# Patient Record
Sex: Female | Born: 1970 | Race: White | Hispanic: No | Marital: Married | Smoking: Never smoker
Health system: Southern US, Community
[De-identification: ages and names within clinical notes are randomized; demographics above are authoritative.]

## PROBLEM LIST (undated history)

## (undated) ENCOUNTER — Emergency Department (HOSPITAL_COMMUNITY): Disposition: A | Discharge status: Home / Self Care | Payer: 59

## (undated) ENCOUNTER — Inpatient Hospital Stay (HOSPITAL_COMMUNITY): Payer: Self-pay

## (undated) DIAGNOSIS — Z9889 Other specified postprocedural states: Secondary | ICD-10-CM

## (undated) DIAGNOSIS — G43909 Migraine, unspecified, not intractable, without status migrainosus: Secondary | ICD-10-CM

## (undated) DIAGNOSIS — O039 Complete or unspecified spontaneous abortion without complication: Secondary | ICD-10-CM

## (undated) DIAGNOSIS — G43709 Chronic migraine without aura, not intractable, without status migrainosus: Secondary | ICD-10-CM

## (undated) DIAGNOSIS — D649 Anemia, unspecified: Secondary | ICD-10-CM

## (undated) DIAGNOSIS — R0789 Other chest pain: Secondary | ICD-10-CM

## (undated) DIAGNOSIS — R112 Nausea with vomiting, unspecified: Secondary | ICD-10-CM

## (undated) DIAGNOSIS — J189 Pneumonia, unspecified organism: Secondary | ICD-10-CM

## (undated) DIAGNOSIS — E559 Vitamin D deficiency, unspecified: Secondary | ICD-10-CM

## (undated) DIAGNOSIS — M1990 Unspecified osteoarthritis, unspecified site: Secondary | ICD-10-CM

## (undated) DIAGNOSIS — IMO0002 Reserved for concepts with insufficient information to code with codable children: Secondary | ICD-10-CM

## (undated) DIAGNOSIS — J45909 Unspecified asthma, uncomplicated: Secondary | ICD-10-CM

## (undated) HISTORY — PX: DILATION AND CURETTAGE OF UTERUS: SHX78

## (undated) HISTORY — DX: Chronic migraine without aura, not intractable, without status migrainosus: G43.709

## (undated) HISTORY — PX: ABDOMINAL HYSTERECTOMY: SHX81

## (undated) HISTORY — DX: Other chest pain: R07.89

## (undated) HISTORY — PX: OTHER SURGICAL HISTORY: SHX169

## (undated) HISTORY — PX: UMBILICAL HERNIA REPAIR: SHX196

## (undated) HISTORY — DX: Other specified postprocedural states: Z98.890

## (undated) HISTORY — PX: BREAST SURGERY: SHX581

## (undated) HISTORY — DX: Reserved for concepts with insufficient information to code with codable children: IMO0002

## (undated) HISTORY — DX: Complete or unspecified spontaneous abortion without complication: O03.9

---

## 1997-10-09 ENCOUNTER — Encounter: Admission: RE | Admit: 1997-10-09 | Discharge: 1998-01-07 | Payer: Self-pay | Admitting: Obstetrics & Gynecology

## 1998-02-11 ENCOUNTER — Encounter (HOSPITAL_COMMUNITY): Admission: RE | Admit: 1998-02-11 | Discharge: 1998-05-12 | Payer: Self-pay | Admitting: *Deleted

## 1998-05-16 ENCOUNTER — Encounter (HOSPITAL_COMMUNITY): Admission: RE | Admit: 1998-05-16 | Discharge: 1998-08-14 | Payer: Self-pay | Admitting: *Deleted

## 1998-12-03 ENCOUNTER — Ambulatory Visit (HOSPITAL_COMMUNITY): Admission: RE | Admit: 1998-12-03 | Discharge: 1998-12-03 | Payer: Self-pay

## 1999-01-27 ENCOUNTER — Other Ambulatory Visit: Admission: RE | Admit: 1999-01-27 | Discharge: 1999-01-27 | Payer: Self-pay | Admitting: Obstetrics and Gynecology

## 1999-09-17 ENCOUNTER — Inpatient Hospital Stay (HOSPITAL_COMMUNITY): Admission: AD | Admit: 1999-09-17 | Discharge: 1999-09-17 | Payer: Self-pay | Admitting: Obstetrics and Gynecology

## 1999-10-27 ENCOUNTER — Emergency Department (HOSPITAL_COMMUNITY): Admission: EM | Admit: 1999-10-27 | Discharge: 1999-10-27 | Payer: Self-pay | Admitting: *Deleted

## 1999-11-05 ENCOUNTER — Inpatient Hospital Stay (HOSPITAL_COMMUNITY): Admission: AD | Admit: 1999-11-05 | Discharge: 1999-11-05 | Payer: Self-pay | Admitting: Obstetrics and Gynecology

## 1999-11-27 ENCOUNTER — Inpatient Hospital Stay (HOSPITAL_COMMUNITY): Admission: AD | Admit: 1999-11-27 | Discharge: 1999-11-27 | Payer: Self-pay | Admitting: Obstetrics and Gynecology

## 1999-12-11 ENCOUNTER — Inpatient Hospital Stay (HOSPITAL_COMMUNITY): Admission: AD | Admit: 1999-12-11 | Discharge: 1999-12-11 | Payer: Self-pay | Admitting: Obstetrics and Gynecology

## 1999-12-20 ENCOUNTER — Inpatient Hospital Stay (HOSPITAL_COMMUNITY): Admission: AD | Admit: 1999-12-20 | Discharge: 1999-12-20 | Payer: Self-pay | Admitting: Obstetrics & Gynecology

## 2000-01-03 ENCOUNTER — Inpatient Hospital Stay (HOSPITAL_COMMUNITY): Admission: AD | Admit: 2000-01-03 | Discharge: 2000-01-03 | Payer: Self-pay | Admitting: Obstetrics and Gynecology

## 2000-01-12 ENCOUNTER — Inpatient Hospital Stay (HOSPITAL_COMMUNITY): Admission: AD | Admit: 2000-01-12 | Discharge: 2000-01-12 | Payer: Self-pay | Admitting: Obstetrics & Gynecology

## 2000-01-19 ENCOUNTER — Observation Stay (HOSPITAL_COMMUNITY): Admission: AD | Admit: 2000-01-19 | Discharge: 2000-01-20 | Payer: Self-pay | Admitting: Obstetrics and Gynecology

## 2000-01-31 ENCOUNTER — Inpatient Hospital Stay (HOSPITAL_COMMUNITY): Admission: AD | Admit: 2000-01-31 | Discharge: 2000-01-31 | Payer: Self-pay | Admitting: Obstetrics and Gynecology

## 2000-02-08 ENCOUNTER — Inpatient Hospital Stay (HOSPITAL_COMMUNITY): Admission: AD | Admit: 2000-02-08 | Discharge: 2000-02-10 | Payer: Self-pay | Admitting: Obstetrics and Gynecology

## 2000-02-11 ENCOUNTER — Encounter: Admission: RE | Admit: 2000-02-11 | Discharge: 2000-04-05 | Payer: Self-pay | Admitting: Obstetrics and Gynecology

## 2000-03-11 ENCOUNTER — Other Ambulatory Visit: Admission: RE | Admit: 2000-03-11 | Discharge: 2000-03-11 | Payer: Self-pay | Admitting: Obstetrics and Gynecology

## 2000-05-02 ENCOUNTER — Emergency Department (HOSPITAL_COMMUNITY): Admission: EM | Admit: 2000-05-02 | Discharge: 2000-05-02 | Payer: Self-pay | Admitting: Emergency Medicine

## 2001-01-20 ENCOUNTER — Emergency Department (HOSPITAL_COMMUNITY): Admission: EM | Admit: 2001-01-20 | Discharge: 2001-01-20 | Payer: Self-pay | Admitting: Emergency Medicine

## 2001-01-20 ENCOUNTER — Encounter: Payer: Self-pay | Admitting: Emergency Medicine

## 2001-08-25 ENCOUNTER — Other Ambulatory Visit: Admission: RE | Admit: 2001-08-25 | Discharge: 2001-08-25 | Payer: Self-pay | Admitting: Obstetrics and Gynecology

## 2002-02-20 ENCOUNTER — Emergency Department (HOSPITAL_COMMUNITY): Admission: EM | Admit: 2002-02-20 | Discharge: 2002-02-20 | Payer: Self-pay | Admitting: Emergency Medicine

## 2003-11-20 ENCOUNTER — Other Ambulatory Visit: Admission: RE | Admit: 2003-11-20 | Discharge: 2003-11-20 | Payer: Self-pay | Admitting: Obstetrics and Gynecology

## 2006-10-07 HISTORY — PX: OTHER SURGICAL HISTORY: SHX169

## 2006-10-12 ENCOUNTER — Encounter: Admission: RE | Admit: 2006-10-12 | Discharge: 2006-10-12 | Payer: Self-pay | Admitting: Surgery

## 2006-10-12 ENCOUNTER — Ambulatory Visit (HOSPITAL_BASED_OUTPATIENT_CLINIC_OR_DEPARTMENT_OTHER): Admission: RE | Admit: 2006-10-12 | Discharge: 2006-10-12 | Payer: Self-pay | Admitting: Surgery

## 2006-10-12 ENCOUNTER — Encounter (INDEPENDENT_AMBULATORY_CARE_PROVIDER_SITE_OTHER): Payer: Self-pay | Admitting: Specialist

## 2010-06-03 ENCOUNTER — Ambulatory Visit: Payer: Self-pay | Admitting: Cardiology

## 2010-06-03 ENCOUNTER — Ambulatory Visit: Payer: Self-pay | Admitting: Vascular Surgery

## 2010-06-03 ENCOUNTER — Inpatient Hospital Stay (HOSPITAL_COMMUNITY): Admission: EM | Admit: 2010-06-03 | Discharge: 2010-06-04 | Payer: Self-pay | Admitting: Emergency Medicine

## 2010-06-03 ENCOUNTER — Encounter (INDEPENDENT_AMBULATORY_CARE_PROVIDER_SITE_OTHER): Payer: Self-pay | Admitting: Internal Medicine

## 2010-11-19 LAB — CBC
HCT: 38.7 % (ref 36.0–46.0)
MCH: 30.6 pg (ref 26.0–34.0)
MCHC: 34.1 g/dL (ref 30.0–36.0)
MCV: 89.8 fL (ref 78.0–100.0)
Platelets: 344 10*3/uL (ref 150–400)
RDW: 12.2 % (ref 11.5–15.5)
WBC: 11.8 10*3/uL — ABNORMAL HIGH (ref 4.0–10.5)

## 2010-11-19 LAB — LIPID PANEL
LDL Cholesterol: 49 mg/dL (ref 0–99)
Total CHOL/HDL Ratio: 2.9 RATIO
Triglycerides: 194 mg/dL — ABNORMAL HIGH (ref <150)
VLDL: 39 mg/dL (ref 0–40)

## 2010-11-19 LAB — URINALYSIS, ROUTINE W REFLEX MICROSCOPIC
Bilirubin Urine: NEGATIVE
Ketones, ur: NEGATIVE mg/dL
Nitrite: NEGATIVE
Protein, ur: NEGATIVE mg/dL
Urobilinogen, UA: 0.2 mg/dL (ref 0.0–1.0)

## 2010-11-19 LAB — DIFFERENTIAL
Basophils Absolute: 0 10*3/uL (ref 0.0–0.1)
Basophils Relative: 0 % (ref 0–1)
Lymphocytes Relative: 22 % (ref 12–46)
Monocytes Relative: 8 % (ref 3–12)
Neutro Abs: 8 10*3/uL — ABNORMAL HIGH (ref 1.7–7.7)
Neutrophils Relative %: 68 % (ref 43–77)

## 2010-11-19 LAB — COMPREHENSIVE METABOLIC PANEL
Alkaline Phosphatase: 48 U/L (ref 39–117)
BUN: 12 mg/dL (ref 6–23)
Creatinine, Ser: 0.92 mg/dL (ref 0.4–1.2)
Glucose, Bld: 101 mg/dL — ABNORMAL HIGH (ref 70–99)
Potassium: 3.8 mEq/L (ref 3.5–5.1)
Total Protein: 6.6 g/dL (ref 6.0–8.3)

## 2010-11-19 LAB — POCT I-STAT, CHEM 8
Calcium, Ion: 1.13 mmol/L (ref 1.12–1.32)
Chloride: 105 mEq/L (ref 96–112)
HCT: 40 % (ref 36.0–46.0)
Sodium: 139 mEq/L (ref 135–145)
TCO2: 27 mmol/L (ref 0–100)

## 2010-11-19 LAB — CK TOTAL AND CKMB (NOT AT ARMC): Relative Index: INVALID (ref 0.0–2.5)

## 2010-11-19 LAB — PROTIME-INR: INR: 0.93 (ref 0.00–1.49)

## 2010-11-19 LAB — APTT: aPTT: 27 seconds (ref 24–37)

## 2011-01-22 NOTE — Op Note (Signed)
[name], Sheryl [name]                   ACCOUNT NO.:  [id number]   MEDICAL RECORD NO.:  [id number]          PATIENT TYPE:  AMB   LOCATION:  DSC                          FACILITY:  [facility]   PHYSICIAN:  Currie Paris, M.D.DATE OF BIRTH:  06-02-1971   DATE OF PROCEDURE:  10/12/2006  DATE OF DISCHARGE:                               OPERATIVE REPORT   PREOPERATIVE DIAGNOSIS:  Left breast mass.   POSTOPERATIVE DIAGNOSIS:  Left breast mass.   OPERATION:  Needle guided excision left breast mass.   SURGEON:  Currie Paris, M.D.   ASSISTANT:  Unk Lightning, PA student   ANESTHESIA:  General.   CLINICAL HISTORY:  This is a 35-year lady who had a mammographically  found left breast mass that she wished to have removed.   DESCRIPTION OF PROCEDURE:  The patient was seen in the holding area and  she had no further questions.  The left breast was marked as the  operative side.  A guidewire had already been placed and I reviewed the  mammogram films.  The patient was then taken to the operating room and  after satisfactory general anesthesia had obtained, the left breast was  prepped and draped as a sterile field.  The time-out occurred.   I made a skin mark along the apparent tract of the guidewire and then  made a skin incision.  I divided some of the breast tissue and then  grasped the breast tissue around the guidewire with an Allis clamp and,  using cautery, took a cylinder of tissue around the guidewire.  Towards  the end, I found a of 1.5 cm mass consistent with what was seen on the  mammogram and made sure I excised that.  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  Once this was completely out, the tissue was sent  for specimen mammogram.   The area was check for hemostasis and once everything was dry, I  injected 0.25% plain Marcaine to help with postop analgesia.  The  incision was closed in layers with 3-0 Vicryl, 4-0 Monocryl  subcuticular, and Dermabond on the  skin.   Dr. Jean Rosenthal reported that the mammogram showed the lesion to be  contained in the tissue removed as a specimen.   The patient tolerated the procedure well.  There were no operative  complications.  All counts were correct.      Currie Paris, M.D.  Electronically Signed     CJS/MEDQ  D:  10/12/2006  T:  10/12/2006  Job:  [id number]

## 2011-10-21 ENCOUNTER — Other Ambulatory Visit: Payer: Self-pay | Admitting: Family Medicine

## 2011-10-21 ENCOUNTER — Ambulatory Visit
Admission: RE | Admit: 2011-10-21 | Discharge: 2011-10-21 | Disposition: A | Discharge status: Home / Self Care | Payer: 59 | Source: Ambulatory Visit | Attending: Family Medicine | Admitting: Family Medicine

## 2011-10-21 DIAGNOSIS — I2699 Other pulmonary embolism without acute cor pulmonale: Secondary | ICD-10-CM

## 2011-10-21 MED ORDER — IOHEXOL 350 MG/ML SOLN
125.0000 mL | Freq: Once | INTRAVENOUS | Status: AC | PRN
  Administered 2011-10-21: 125 mL via INTRAVENOUS

## 2012-01-07 ENCOUNTER — Other Ambulatory Visit (INDEPENDENT_AMBULATORY_CARE_PROVIDER_SITE_OTHER): Payer: 59

## 2012-01-07 ENCOUNTER — Ambulatory Visit (INDEPENDENT_AMBULATORY_CARE_PROVIDER_SITE_OTHER): Payer: 59 | Admitting: Pulmonary Disease

## 2012-01-07 ENCOUNTER — Encounter: Payer: Self-pay | Admitting: Pulmonary Disease

## 2012-01-07 DIAGNOSIS — R079 Chest pain, unspecified: Secondary | ICD-10-CM

## 2012-01-07 DIAGNOSIS — R0789 Other chest pain: Secondary | ICD-10-CM

## 2012-01-07 HISTORY — DX: Other chest pain: R07.89

## 2012-01-07 LAB — SEDIMENTATION RATE: Sed Rate: 21 mm/hr (ref 0–22)

## 2012-01-07 NOTE — Patient Instructions (Signed)
Will check some basic labs for possible autoimmune disease, and will call you with results.  Please call for apptm if your discomfort worsens along with increased shortness of breath.

## 2012-01-07 NOTE — Progress Notes (Signed)
Subjective:    Patient ID: Sheryl [name], female    DOB: 22-Jul-1971, 41 y.o.   MRN: [id number]  HPI The patient is a 41 year old female who had been asked to see for left-sided chest pain.  The patient was in her usual state of health until February of this year when she developed slowly progressive left-sided pleuritic chest pain.  She had no viral prodrome.  She underwent a CT of her chest to rule out pulmonary embolus, and no acute process was seen.  There was good opacification of the large and mid sized vessels, but not in the peripheral smaller vessels.  I have reviewed this study and feel it was a reasonable test for pulmonary embolus.  Patient was treated with a course of prednisone with improvement, but she had a recurrence once this was discontinued.  The middle of March she had another episode of her leg pain, but also described a warm sensation inside of her left chest.  This was accompanied with shortness of breath.  She had a chest x-ray done that was unremarkable.  She was treated again with a course of prednisone with improvement.  Shortly thereafter, she developed sinus congestion with purulent drainage, as well as chest congestion.  She was felt to have a sign of bronchitis, and was treated with an antibiotic x2.  All of her congestion has subsequently cleared, and she has no cough or mucus production at this time.  She still has an uncomfortable feeling that she is describing as a pressure in her left chest.  She no longer has a pleuritic component.  She feels that her breathing is back to her usual baseline.  The patient has no history of thromboembolic disease, and no family history of autoimmune disease.  She has had no significant lower extremity edema.  She is unable to take salicylates or NSAIDs due to allergy.   Review of Systems  Constitutional: Negative for fever and unexpected weight change.  HENT: Positive for congestion and sneezing. Negative for ear pain, nosebleeds, sore  throat, rhinorrhea, trouble swallowing, dental problem, postnasal drip and sinus pressure.   Eyes: Negative for redness and itching.  Respiratory: Positive for cough and shortness of breath. Negative for chest tightness and wheezing.   Cardiovascular: Positive for chest pain. Negative for palpitations and leg swelling.  Gastrointestinal: Negative for nausea and vomiting.  Genitourinary: Negative for dysuria.  Musculoskeletal: Negative for joint swelling.  Skin: Negative for rash.  Neurological: Positive for headaches.  Hematological: Does not bruise/bleed easily.  Psychiatric/Behavioral: Negative for dysphoric mood. The patient is not nervous/anxious.        Objective:   Physical Exam Constitutional:  Obese female, no acute distress  HENT:  Nares patent without discharge  Oropharynx without exudate, palate and uvula are normal  Eyes:  Perrla, eomi, no scleral icterus  Neck:  No JVD, no TMG  Cardiovascular:  Normal rate, regular rhythm, no rubs or gallops.  No murmurs        Intact distal pulses  Pulmonary :  Normal breath sounds, no stridor or respiratory distress   No rales, rhonchi, or wheezing.  No rub noted.   Abdominal:  Soft, nondistended, bowel sounds present.  No tenderness noted.   Musculoskeletal:  No lower extremity edema noted.  Lymph Nodes:  No cervical lymphadenopathy noted  Skin:  No cyanosis noted  Neurologic:  Alert, appropriate, moves all 4 extremities without obvious deficit.         Assessment & Plan:

## 2012-01-07 NOTE — Assessment & Plan Note (Signed)
The patient initially had pleuritic chest pain in February of this year, and ultimately responded to courses of prednisone.  I suspect this was viral in origin.  She is now left with an uncomfortable feeling that she describes as pressure in her left chest.  This is not causing breathing issues, and she has no significant cough or congestion.  All of her x-ray exams have been unremarkable.  Her lungs are totally clear to auscultation, and her oxygen saturations are excellent.  Hopefully this will slowly improve over time and ultimately resolved.  I do think we need to check some basic autoimmune labs to make sure that it is not related.

## 2012-01-08 LAB — RHEUMATOID FACTOR: Rhuematoid fact SerPl-aCnc: <10 IU/mL (ref <=14)

## 2012-01-10 LAB — ANA: Anti Nuclear Antibody(ANA): NEGATIVE

## 2012-11-17 ENCOUNTER — Encounter (HOSPITAL_COMMUNITY): Payer: Self-pay | Admitting: *Deleted

## 2012-11-17 ENCOUNTER — Emergency Department (HOSPITAL_COMMUNITY)
Admission: EM | Admit: 2012-11-17 | Discharge: 2012-11-17 | Disposition: A | Discharge status: Home / Self Care | Payer: 59 | Attending: Emergency Medicine | Admitting: Emergency Medicine

## 2012-11-17 DIAGNOSIS — H53149 Visual discomfort, unspecified: Secondary | ICD-10-CM | POA: Insufficient documentation

## 2012-11-17 DIAGNOSIS — G43909 Migraine, unspecified, not intractable, without status migrainosus: Secondary | ICD-10-CM | POA: Insufficient documentation

## 2012-11-17 HISTORY — DX: Migraine, unspecified, not intractable, without status migrainosus: G43.909

## 2012-11-17 MED ORDER — HYDROMORPHONE HCL PF 1 MG/ML IJ SOLN
1.0000 mg | Freq: Once | INTRAMUSCULAR | Status: AC
  Administered 2012-11-17: 1 mg via INTRAVENOUS
  Filled 2012-11-17: qty 1

## 2012-11-17 MED ORDER — METOCLOPRAMIDE HCL 5 MG/ML IJ SOLN
10.0000 mg | Freq: Once | INTRAMUSCULAR | Status: AC
  Administered 2012-11-17: 10 mg via INTRAVENOUS
  Filled 2012-11-17: qty 2

## 2012-11-17 NOTE — ED Notes (Signed)
Pt reports having migraine x 3-4 days, no relief with meds at home, has hx of migraines. Having sensitivity to light, sound and having n/v. Reports only way she gets relief is getting a shot of demerol.

## 2012-11-17 NOTE — ED Provider Notes (Signed)
History     CSN: [id number]  Arrival date & time 11/17/12  1544   First MD Initiated Contact with Patient 11/17/12 2011      Chief Complaint  Patient presents with  . Migraine    (Consider location/radiation/quality/duration/timing/severity/associated sxs/prior treatment) HPI  Sheryl [name] is a 42 y.o. female complaining of exacerbation of chronic migraines. He should is on a beta blocker for prevention, she has tried Dilaudid by mouth at home with no relief. This episode has lasted for 3 days. She normally gets gets them about the right or left eye. Today it is located in the right periorbital region. Pain is associated with both photophobia and phonophobia. She describes this exacerbation as typical of prior exacerbations in location, character and severity she has presented to the emergency room because she's not been able to abort it.  Past Medical History  Diagnosis Date  . Migraines     History reviewed. No pertinent past surgical history.  History reviewed. No pertinent family history.  History  Substance Use Topics  . Smoking status: Not on file  . Smokeless tobacco: Not on file  . Alcohol Use: No    OB History   Grav Para Term Preterm Abortions TAB SAB Ect Mult Living                  Review of Systems  Neurological: Positive for headaches.    Allergies  Salicylates  Home Medications  No current outpatient prescriptions on file.  BP 122/71  Pulse 65  Temp(Src) 97.9 F (36.6 C) (Oral)  Resp 18  SpO2 100%  Physical Exam  Nursing note and vitals reviewed. Constitutional: She is oriented to person, place, and time. She appears well-developed and well-nourished. No distress.  HENT:  Head: Normocephalic and atraumatic.  Mouth/Throat: Oropharynx is clear and moist.  Eyes: Conjunctivae and EOM are normal. Pupils are equal, round, and reactive to light.  Neck: Normal range of motion. Neck supple.  Cardiovascular: Normal rate, regular rhythm and  intact distal pulses.   Pulmonary/Chest: Effort normal and breath sounds normal. No stridor.  Abdominal: Soft.  Musculoskeletal: Normal range of motion.  Neurological: She is alert and oriented to person, place, and time.  Jilda Panda is 5 out of 5 times for tremors, distal sensation is grossly intact, patient and a coordinated in nonantalgic gait.  Psychiatric: She has a normal mood and affect.    ED Course  Procedures (including critical care time)  Labs Reviewed - No data to display No results found.   1. Migraine headache       MDM   Patient with migraine exacerbation typical of her prior episodes. No relief with home by mouth medications.  10:04 PM headache is down from a 10 to a 6 with IV Reglan  10:25 PM HA it is completely resolved after Dilaudid.    Pt verbalized understanding and agrees with care plan. Outpatient follow-up and return precautions given.           Wynetta Emery, PA-C 11/17/12 2244

## 2012-11-18 NOTE — ED Provider Notes (Signed)
Medical screening examination/treatment/procedure(s) were performed by non-physician practitioner and as supervising physician I was immediately available for consultation/collaboration.  Sam Jacubowitz, MD 11/18/12 0043 

## 2013-02-28 ENCOUNTER — Other Ambulatory Visit: Payer: Self-pay | Admitting: Obstetrics and Gynecology

## 2013-08-13 ENCOUNTER — Inpatient Hospital Stay (HOSPITAL_COMMUNITY): Payer: 59

## 2013-08-13 ENCOUNTER — Inpatient Hospital Stay (HOSPITAL_COMMUNITY)
Admission: AD | Admit: 2013-08-13 | Discharge: 2013-08-13 | Disposition: A | Discharge status: Home / Self Care | Payer: 59 | Source: Ambulatory Visit | Attending: Obstetrics and Gynecology | Admitting: Obstetrics and Gynecology

## 2013-08-13 ENCOUNTER — Encounter (HOSPITAL_COMMUNITY): Payer: Self-pay | Admitting: *Deleted

## 2013-08-13 DIAGNOSIS — Z3490 Encounter for supervision of normal pregnancy, unspecified, unspecified trimester: Secondary | ICD-10-CM

## 2013-08-13 DIAGNOSIS — O3481 Maternal care for other abnormalities of pelvic organs, first trimester: Secondary | ICD-10-CM

## 2013-08-13 DIAGNOSIS — N83209 Unspecified ovarian cyst, unspecified side: Secondary | ICD-10-CM | POA: Insufficient documentation

## 2013-08-13 DIAGNOSIS — Z1389 Encounter for screening for other disorder: Secondary | ICD-10-CM

## 2013-08-13 DIAGNOSIS — O34599 Maternal care for other abnormalities of gravid uterus, unspecified trimester: Secondary | ICD-10-CM | POA: Insufficient documentation

## 2013-08-13 DIAGNOSIS — O418X10 Other specified disorders of amniotic fluid and membranes, first trimester, not applicable or unspecified: Secondary | ICD-10-CM

## 2013-08-13 DIAGNOSIS — O208 Other hemorrhage in early pregnancy: Secondary | ICD-10-CM

## 2013-08-13 LAB — WET PREP, GENITAL
Clue Cells Wet Prep HPF POC: NONE SEEN
Trich, Wet Prep: NONE SEEN

## 2013-08-13 LAB — CBC
MCH: 28 pg (ref 26.0–34.0)
MCHC: 33.5 g/dL (ref 30.0–36.0)
MCV: 83.5 fL (ref 78.0–100.0)
Platelets: 418 10*3/uL — ABNORMAL HIGH (ref 150–400)
RBC: 4.18 MIL/uL (ref 3.87–5.11)

## 2013-08-13 LAB — HCG, QUANTITATIVE, PREGNANCY: hCG, Beta Chain, Quant, S: 25871 m[IU]/mL — ABNORMAL HIGH

## 2013-08-13 LAB — OB RESULTS CONSOLE ABO/RH: RH TYPE: POSITIVE

## 2013-08-13 LAB — OB RESULTS CONSOLE GC/CHLAMYDIA
CHLAMYDIA, DNA PROBE: NEGATIVE
GC PROBE AMP, GENITAL: NEGATIVE

## 2013-08-13 LAB — ABO/RH: ABO/RH(D): A POS

## 2013-08-13 NOTE — MAU Note (Signed)
Pt had two +HPT, had intercourse last Saturday and started spotting pink/red today. Denies any abd cramping or vag discharge  That is different for her.

## 2013-08-13 NOTE — MAU Provider Note (Signed)
Chief Complaint: No chief complaint on file.   First Provider Initiated Contact with Patient 08/13/13 1829     SUBJECTIVE HPI: Sheryl [name] is a 42 y.o. [id number] at [redacted]w[redacted]d by LMP who presents to maternity admissions reporting vaginal spotting when wiping x1 episode today.  This is a desired pregnancy.  She has her first appointment with her OB office tomorrow morning.  She has been seen by Dr Miguel Aschoff in the past but he is no longer doing OB so she will see other MDs in his practice.  She denies pain, vaginal itching/burning, urinary symptoms, h/a, dizziness, n/v, or fever/chills.    Patient's last menstrual period was 06/28/2013.  Her period was lighter than usual.  Otherwise, she has regular cycles that are moderate in amount.   Past Medical History  Diagnosis Date  . Migraines    Past Surgical History  Procedure Laterality Date  . Breast surgery      cyst removed from left breast; benign   History   Social History  . Marital Status: Married    Spouse Name: N/A    Number of Children: N/A  . Years of Education: N/A   Occupational History  . Not on file.   Social History Main Topics  . Smoking status: Never Smoker   . Smokeless tobacco: Not on file  . Alcohol Use: No  . Drug Use: No  . Sexual Activity: Yes     Comment: two days ago   Other Topics Concern  . Not on file   Social History Narrative  . No narrative on file   No current facility-administered medications on file prior to encounter.   No current outpatient prescriptions on file prior to encounter.   Allergies  Allergen Reactions  . Salicylates Anaphylaxis, Hives and Shortness Of Breath    ROS: Pertinent items in HPI  OBJECTIVE Blood pressure 126/74, pulse 87, temperature 98.6 F (37 C), temperature source Oral, height 5\' 7"  (1.702 m), weight 114.76 kg (253 lb), last menstrual period 06/28/2013. GENERAL: Well-developed, well-nourished female in no acute distress.  HEENT: Normocephalic HEART: normal  rate RESP: normal effort ABDOMEN: Soft, non-tender EXTREMITIES: Nontender, no edema NEURO: Alert and oriented Pelvic exam: Cervix pink, visually closed, without lesion, small amount white thick discharge, bleeding noted with vaginal swab at os, friability vs blood already in os during swab, vaginal walls and external genitalia normal Bimanual exam: Cervix 0/long/high, firm, anterior, neg CMT, unable to palpate uterine size or adnexa r/t body habitus.   LAB RESULTS Results for orders placed during the hospital encounter of 08/13/13 (from the past 24 hour(s))  WET PREP, GENITAL     Status: Abnormal   Collection Time    08/13/13  6:30 PM      Result Value Range   Yeast Wet Prep HPF POC NONE SEEN  NONE SEEN   Trich, Wet Prep NONE SEEN  NONE SEEN   Clue Cells Wet Prep HPF POC NONE SEEN  NONE SEEN   WBC, Wet Prep HPF POC MANY (*) NONE SEEN  CBC     Status: Abnormal   Collection Time    08/13/13  6:45 PM      Result Value Range   WBC 9.5  4.0 - 10.5 K/uL   RBC 4.18  3.87 - 5.11 MIL/uL   Hemoglobin 11.7 (*) 12.0 - 15.0 g/dL   HCT 45.4 (*) 09.8 - 11.9 %   MCV 83.5  78.0 - 100.0 fL   MCH 28.0  26.0 - 34.0 pg   MCHC 33.5  30.0 - 36.0 g/dL   RDW 62.9  52.8 - 41.3 %   Platelets 418 (*) 150 - 400 K/uL  HCG, QUANTITATIVE, PREGNANCY     Status: Abnormal   Collection Time    08/13/13  6:45 PM      Result Value Range   hCG, Beta Chain, Quant, [place] [address] (*) <5 mIU/mL  ABO/RH     Status: None   Collection Time    08/13/13  6:45 PM      Result Value Range   ABO/RH(D) A POS      IMAGING US Ob Comp Less 14 Wks  08/13/2013   CLINICAL DATA:  Vaginal bleeding. Gestational age by LMP of 6 weeks 4 days.  EXAM: OBSTETRIC <14 WK [place] AND TRANSVAGINAL OB US  TECHNIQUE: Both transabdominal and transvaginal ultrasound examinations were performed for complete evaluation of the gestation as well as the maternal uterus, adnexal regions, and pelvic cul-de-sac. Transvaginal technique was performed to assess early  pregnancy.  COMPARISON:  None.  FINDINGS: Intrauterine gestational sac: Visualized/normal in shape.  Yolk sac:  Visualized  Embryo:  Visualized  Cardiac Activity: Visualized  Heart Rate:  148 bpm  CRL:   11  mm   7 w 2d                  [place] EDC: 03/30/2014  Maternal uterus/adnexae: Both ovaries are normal in appearance. A simple appearing cyst is seen in the right adnexa which appears separate from the ovary and measures 7.0 x 6.2 by 5.8 cm. This has benign sonographic features and may represent a paraovarian cyst. No evidence free fluid.  IMPRESSION: Single living IUP measuring 7 weeks 2 days with [place] EDC of 03/30/2014. This is concordant with LMP.  7 cm benign appearing cyst in the right adnexa, suspicious for paraovarian cyst. Followup by ultrasound recommended in 6 weeks.   Electronically Signed   By: Myles Rosenthal M.D.   On: 08/13/2013 19:35   US Ob Transvaginal  08/13/2013   CLINICAL DATA:  Vaginal bleeding. Gestational age by LMP of 6 weeks 4 days.  EXAM: OBSTETRIC <14 WK [place] AND TRANSVAGINAL OB US  TECHNIQUE: Both transabdominal and transvaginal ultrasound examinations were performed for complete evaluation of the gestation as well as the maternal uterus, adnexal regions, and pelvic cul-de-sac. Transvaginal technique was performed to assess early pregnancy.  COMPARISON:  None.  FINDINGS: Intrauterine gestational sac: Visualized/normal in shape.  Yolk sac:  Visualized  Embryo:  Visualized  Cardiac Activity: Visualized  Heart Rate:  148 bpm  CRL:   11  mm   7 w 2d                  [place] EDC: 03/30/2014  Maternal uterus/adnexae: Both ovaries are normal in appearance. A simple appearing cyst is seen in the right adnexa which appears separate from the ovary and measures 7.0 x 6.2 by 5.8 cm. This has benign sonographic features and may represent a paraovarian cyst. No evidence free fluid.  IMPRESSION: Single living IUP measuring 7 weeks 2 days with [place] EDC of 03/30/2014. This is concordant with LMP.  7 cm benign  appearing cyst in the right adnexa, suspicious for paraovarian cyst. Followup by ultrasound recommended in 6 weeks.   Electronically Signed   By: Myles Rosenthal M.D.   On: 08/13/2013 19:35    ASSESSMENT 1. Normal IUP (intrauterine pregnancy) on prenatal ultrasound   2. Subchorionic  hemorrhage in first trimester   3. Ovarian cyst in pregnancy in first trimester     PLAN Discharge home with bleeding precautions Pelvic rest while bleeding F/U as scheduled in office Return to MAU as needed    Medication List    ASK your doctor about these medications       acetaminophen 500 MG tablet  Commonly known as:  TYLENOL  Take 1,000 mg by mouth every 4 (four) hours as needed for headache.     aspirin 81 MG chewable tablet  Chew 81 mg by mouth daily.     prenatal multivitamin Tabs tablet  Take 1 tablet by mouth daily at 12 noon.         Sharen Counter Certified Nurse-Midwife 08/13/2013  6:37 PM

## 2013-08-14 LAB — GC/CHLAMYDIA PROBE AMP
CT Probe RNA: NEGATIVE
GC Probe RNA: NEGATIVE

## 2013-08-16 NOTE — MAU Provider Note (Signed)
agree

## 2013-08-25 ENCOUNTER — Encounter (HOSPITAL_COMMUNITY): Payer: Self-pay | Admitting: *Deleted

## 2013-08-25 ENCOUNTER — Inpatient Hospital Stay (HOSPITAL_COMMUNITY): Payer: 59

## 2013-08-25 ENCOUNTER — Inpatient Hospital Stay (HOSPITAL_COMMUNITY)
Admission: AD | Admit: 2013-08-25 | Discharge: 2013-08-25 | Disposition: A | Discharge status: Home / Self Care | Payer: 59 | Source: Ambulatory Visit | Attending: Obstetrics and Gynecology | Admitting: Obstetrics and Gynecology

## 2013-08-25 DIAGNOSIS — Z3689 Encounter for other specified antenatal screening: Secondary | ICD-10-CM

## 2013-08-25 DIAGNOSIS — N83209 Unspecified ovarian cyst, unspecified side: Secondary | ICD-10-CM | POA: Insufficient documentation

## 2013-08-25 DIAGNOSIS — M545 Low back pain, unspecified: Secondary | ICD-10-CM | POA: Insufficient documentation

## 2013-08-25 DIAGNOSIS — R109 Unspecified abdominal pain: Secondary | ICD-10-CM | POA: Insufficient documentation

## 2013-08-25 DIAGNOSIS — O3490 Maternal care for abnormality of pelvic organ, unspecified, unspecified trimester: Secondary | ICD-10-CM

## 2013-08-25 DIAGNOSIS — O210 Mild hyperemesis gravidarum: Secondary | ICD-10-CM | POA: Insufficient documentation

## 2013-08-25 DIAGNOSIS — O34599 Maternal care for other abnormalities of gravid uterus, unspecified trimester: Secondary | ICD-10-CM | POA: Insufficient documentation

## 2013-08-25 LAB — URINALYSIS, ROUTINE W REFLEX MICROSCOPIC
Bilirubin Urine: NEGATIVE
Ketones, ur: NEGATIVE mg/dL
Nitrite: NEGATIVE
Protein, ur: NEGATIVE mg/dL
Specific Gravity, Urine: 1.01 (ref 1.005–1.030)
Urobilinogen, UA: 0.2 mg/dL (ref 0.0–1.0)
pH: 6 (ref 5.0–8.0)

## 2013-08-25 LAB — CBC
HCT: 32.9 % — ABNORMAL LOW (ref 36.0–46.0)
Hemoglobin: 10.9 g/dL — ABNORMAL LOW (ref 12.0–15.0)
MCV: 83.1 fL (ref 78.0–100.0)
RBC: 3.96 MIL/uL (ref 3.87–5.11)
WBC: 16 10*3/uL — ABNORMAL HIGH (ref 4.0–10.5)

## 2013-08-25 LAB — URINE MICROSCOPIC-ADD ON

## 2013-08-25 MED ORDER — PROMETHAZINE HCL 25 MG/ML IJ SOLN
25.0000 mg | Freq: Once | INTRAVENOUS | Status: AC
  Administered 2013-08-25: 25 mg via INTRAVENOUS
  Filled 2013-08-25: qty 1

## 2013-08-25 MED ORDER — HYDROMORPHONE HCL PF 1 MG/ML IJ SOLN
1.0000 mg | Freq: Once | INTRAMUSCULAR | Status: AC
  Administered 2013-08-25: 1 mg via INTRAVENOUS
  Filled 2013-08-25: qty 1

## 2013-08-25 MED ORDER — LACTATED RINGERS IV SOLN
INTRAVENOUS | Status: DC
  Administered 2013-08-25: 16:00:00 via INTRAVENOUS

## 2013-08-25 MED ORDER — PROMETHAZINE HCL 25 MG PO TABS: 25.0000 mg | ORAL_TABLET | Freq: Four times a day (QID) | ORAL | Status: DC | PRN

## 2013-08-25 MED ORDER — OXYCODONE-ACETAMINOPHEN 5-325 MG PO TABS: 1.0000 | ORAL_TABLET | ORAL | Status: DC | PRN

## 2013-08-25 MED ORDER — HYDROCODONE-ACETAMINOPHEN 5-325 MG PO TABS: 1.0000 | ORAL_TABLET | ORAL | Status: DC | PRN

## 2013-08-25 NOTE — MAU Note (Signed)
Pt states hx hyperemesis this pregnancy. Woke up this am feeling nauseated, has vomiting and had loose stools. Now c/o lower abdominal cramping. Denies bleeding or vag d/c changes.

## 2013-08-25 NOTE — MAU Provider Note (Signed)
History     CSN: [id number]  Arrival date and time: 08/25/13 1227   None     Chief Complaint  Patient presents with  . Abdominal Pain  . Emesis   HPI  [name]. Is a 43 y.o. female [phone] at [redacted]w[redacted]d who presents to the MAU with complaints of Nausea, vomiting times 1 episode. No diarrhea; loose stool. Pt is also having bilateral lower back pain and some mild abdominal cramping. Pt currently rates her abdominal pain 3/10; and is currently requesting something for pain.   OB History   Grav Para Term Preterm Abortions TAB SAB Ect Mult Living   6 2  2 2  2   2       Past Medical History  Diagnosis Date  . Migraines     Past Surgical History  Procedure Laterality Date  . Breast surgery      cyst removed from left breast; benign    History reviewed. No pertinent family history.  History  Substance Use Topics  . Smoking status: Never Smoker   . Smokeless tobacco: Never Used  . Alcohol Use: No    Allergies:  Allergies  Allergen Reactions  . Salicylates Anaphylaxis, Hives and Shortness Of Breath    Prescriptions prior to admission  Medication Sig Dispense Refill  . acetaminophen (TYLENOL) 500 MG tablet Take 1,000 mg by mouth every 4 (four) hours as needed for headache.      Marland Kitchen aspirin 81 MG chewable tablet Chew 81 mg by mouth daily.      . Prenatal Vit-Fe Fumarate-FA (PRENATAL MULTIVITAMIN) TABS tablet Take 1 tablet by mouth daily at 12 noon.       Results for orders placed during the hospital encounter of 08/25/13 (from the past 24 hour(s))  URINALYSIS, ROUTINE W REFLEX MICROSCOPIC     Status: Abnormal   Collection Time    08/25/13 12:43 PM      Result Value Range   Color, Urine YELLOW  YELLOW   APPearance CLEAR  CLEAR   Specific Gravity, Urine 1.010  1.005 - 1.030   pH 6.0  5.0 - 8.0   Glucose, UA NEGATIVE  NEGATIVE mg/dL   Hgb urine dipstick TRACE (*) NEGATIVE   Bilirubin Urine NEGATIVE  NEGATIVE   Ketones, ur NEGATIVE  NEGATIVE mg/dL   Protein, ur  NEGATIVE  NEGATIVE mg/dL   Urobilinogen, UA 0.2  0.0 - 1.0 mg/dL   Nitrite NEGATIVE  NEGATIVE   Leukocytes, UA SMALL (*) NEGATIVE  URINE MICROSCOPIC-ADD ON     Status: Abnormal   Collection Time    08/25/13 12:43 PM      Result Value Range   Squamous Epithelial / LPF MANY (*) RARE   WBC, UA 11-20  <3 WBC/hpf   RBC / HPF    <3 RBC/hpf   Value: NO FORMED ELEMENTS SEEN ON URINE MICROSCOPIC EXAMINATION   Bacteria, UA RARE  RARE   Urine-Other MUCOUS PRESENT    CBC     Status: Abnormal   Collection Time    08/25/13  4:10 PM      Result Value Range   WBC 16.0 (*) 4.0 - 10.5 K/uL   RBC 3.96  3.87 - 5.11 MIL/uL   Hemoglobin 10.9 (*) 12.0 - 15.0 g/dL   HCT 13.0 (*) 86.5 - 78.4 %   MCV 83.1  78.0 - 100.0 fL   MCH 27.5  26.0 - 34.0 pg   MCHC 33.1  30.0 - 36.0 g/dL  RDW 14.8  11.5 - 15.5 %   Platelets 397  150 - 400 K/uL   US Ob Comp Less 14 Wks  08/25/2013   CLINICAL DATA:  Pain.  EXAM: OBSTETRIC <14 WK ULTRASOUND  TECHNIQUE: Transabdominal ultrasound was performed for evaluation of the gestation as well as the maternal uterus and adnexal regions.  COMPARISON:  08/13/2013  FINDINGS: Intrauterine gestational sac: Visualized/normal in shape.  Yolk sac:  Visualized  Embryo:  Visualize  Cardiac Activity: Visualize  Heart Rate: 179 bpm  MSD:   mm    w     d  CRL:   21  mm   8 w 6 d                  [place] EDC: 03/31/2014  Maternal uterus/adnexae: No subchorionic hemorrhage. 6.2 cm simple appearing right ovarian cyst, similar to prior study. No free fluid. Left ovary unremarkable.  IMPRESSION: Eight week 6 day intrauterine pregnancy. Fetal heart rate 179 beats per min.  6.2 cm simple appearing right ovarian cyst.  No acute maternal findings.   Electronically Signed   By: Charlett Nose M.D.   On: 08/25/2013 15:53    Review of Systems  Constitutional: Negative for fever and chills.  Gastrointestinal: Positive for nausea, vomiting, abdominal pain and diarrhea.       Bilateral lower abdominal cramping    Genitourinary: Negative for dysuria, urgency, frequency and hematuria.       No vaginal discharge. No vaginal bleeding. No dysuria.   Musculoskeletal: Positive for back pain.   Physical Exam   Blood pressure 134/67, pulse 79, temperature 97.4 F (36.3 C), temperature source Oral, resp. rate 22, height 5\' 7"  (1.702 m), weight 119.976 kg (264 lb 8 oz), last menstrual period 06/28/2013.  Physical Exam  Constitutional: She is oriented to person, place, and time. She appears well-developed and well-nourished. No distress.  HENT:  Head: Normocephalic.  Eyes: Pupils are equal, round, and reactive to light.  Neck: Neck supple.  Respiratory: Effort normal.  GI: Soft. There is tenderness in the right upper quadrant, right lower quadrant and suprapubic area. There is no rigidity, no rebound, no guarding and no tenderness at McBurney's point.  Neurological: She is alert and oriented to person, place, and time.  Skin: Skin is warm. She is not diaphoretic.    MAU Course  Procedures none  MDM Consulted with Dr. Henderson Cloud who agreed with plan of care.  [place] recommended per Dr. Henderson Cloud.   Consulted with Dr. Henderson Cloud regarding [place] results. Ok to send patient home with pain medication to be taken as needed. Pt will need to follow up in the office. Prior US study showed 7 cm right ovarian cyst. Dilaudid 1 mg given Pt unable to tolerate Vicodin; pt able to tolerate percocet.   Assessment and Plan   A:  1.  Abdominal pain in pregnancy   2. Ovarian cyst in pregnancy, first trimester     P: Discharge home RX: Percocet        Phenergan Call the office on Monday if needed, to schedule follow up appt. Return to MAU with worsening symptoms   Sheryl [name] 08/25/2013, 1:24 PM

## 2013-09-18 LAB — OB RESULTS CONSOLE HEPATITIS B SURFACE ANTIGEN: HEP B S AG: NEGATIVE

## 2013-09-18 LAB — OB RESULTS CONSOLE RUBELLA ANTIBODY, IGM: RUBELLA: IMMUNE

## 2013-09-18 LAB — OB RESULTS CONSOLE HIV ANTIBODY (ROUTINE TESTING): HIV: NONREACTIVE

## 2013-09-18 LAB — OB RESULTS CONSOLE RPR: RPR: NONREACTIVE

## 2013-10-04 ENCOUNTER — Inpatient Hospital Stay (HOSPITAL_COMMUNITY)
Admission: AD | Admit: 2013-10-04 | Discharge: 2013-10-04 | Disposition: A | Discharge status: Home / Self Care | Payer: 59 | Source: Ambulatory Visit | Attending: Obstetrics and Gynecology | Admitting: Obstetrics and Gynecology

## 2013-10-04 ENCOUNTER — Encounter (HOSPITAL_COMMUNITY): Payer: Self-pay | Admitting: *Deleted

## 2013-10-04 DIAGNOSIS — S76219A Strain of adductor muscle, fascia and tendon of unspecified thigh, initial encounter: Secondary | ICD-10-CM

## 2013-10-04 DIAGNOSIS — R1031 Right lower quadrant pain: Secondary | ICD-10-CM

## 2013-10-04 DIAGNOSIS — O9989 Other specified diseases and conditions complicating pregnancy, childbirth and the puerperium: Principal | ICD-10-CM

## 2013-10-04 DIAGNOSIS — R109 Unspecified abdominal pain: Secondary | ICD-10-CM | POA: Insufficient documentation

## 2013-10-04 DIAGNOSIS — O34599 Maternal care for other abnormalities of gravid uterus, unspecified trimester: Secondary | ICD-10-CM | POA: Insufficient documentation

## 2013-10-04 DIAGNOSIS — N83209 Unspecified ovarian cyst, unspecified side: Secondary | ICD-10-CM | POA: Insufficient documentation

## 2013-10-04 DIAGNOSIS — O99891 Other specified diseases and conditions complicating pregnancy: Secondary | ICD-10-CM | POA: Insufficient documentation

## 2013-10-04 MED ORDER — ACETAMINOPHEN 500 MG PO TABS
1000.0000 mg | ORAL_TABLET | Freq: Once | ORAL | Status: AC
  Administered 2013-10-04: 1000 mg via ORAL
  Filled 2013-10-04: qty 2

## 2013-10-04 NOTE — MAU Note (Signed)
PT SAYS SHE  IS TAKING TAMIFLU-  BECAUSE  HER SON HAS THE FLU.      AT 420PM- SHE WAS LAYING ON SOFA- SNEEZED AND SHE FELT SHARP PAIN  ON RIGHT SIDE.  FEELS LESS NOW THAN  THEN.   PT FEELS NAUSEA- TODAY.  NO VAG BLEEDING,

## 2013-10-04 NOTE — MAU Note (Signed)
Been nauseous all day. Has been having little twinges on rt side. Was laying on couch, sneezed and had a sharp pain on RLQ. Pain has continued, not as sharp- but still there. Had been dx as having a cyst on the rt side. Small amt of fluid in panties.

## 2013-10-04 NOTE — Discharge Instructions (Signed)

## 2013-10-04 NOTE — MAU Provider Note (Signed)
Chief Complaint: Abdominal Pain and Nausea   First Provider Initiated Contact with Patient 10/04/13 1802     SUBJECTIVE HPI: Sheryl [name] is a 43 y.o. [id number] at 5066w0d by LMP who presents with Sharp right lower quadrant and right groin pain that had sudden onset after sneezing about 30 minutes prior to arrival. The pain has continued to be sharp and is positional with some relief laying on her side. She's had previous episodes of right suprapubic pain since about 9 weeks and she was diagnosed with 6 cm simple ovarian cyst. She has Dilaudid supply at home and she was given for intermittent pains due to this cyst. She has not tried any analgesic since the episode occurred. She has had some mild nausea Present since before the episode of pain.  She is on Tamiflu prophylactically because her son has flu.  Past Medical History  Diagnosis Date  . Migraines    OB History  Gravida Para Term Preterm AB SAB TAB Ectopic Multiple Living  5 2  2 2 2    2     # Outcome Date GA Lbr Len/2nd Weight Sex Delivery Anes PTL Lv  5 CUR           4 PRE           3 PRE           2 SAB           1 SAB              Past Surgical History  Procedure Laterality Date  . Breast surgery      cyst removed from left breast; benign   History   Social History  . Marital Status: Married    Spouse Name: N/A    Number of Children: N/A  . Years of Education: N/A   Occupational History  . Not on file.   Social History Main Topics  . Smoking status: Never Smoker   . Smokeless tobacco: Never Used  . Alcohol Use: No  . Drug Use: No  . Sexual Activity: Yes    Birth Control/ Protection: None     Comment: two days ago   Other Topics Concern  . Not on file   Social History Narrative  . No narrative on file   No current facility-administered medications on file prior to encounter.   Current Outpatient Prescriptions on File Prior to Encounter  Medication Sig Dispense Refill  . acetaminophen (TYLENOL) 500 MG  tablet Take 1,000 mg by mouth every 4 (four) hours as needed for headache.      Marland Kitchen. aspirin 81 MG chewable tablet Chew 81 mg by mouth daily.      . Prenatal Vit-Fe Fumarate-FA (PRENATAL MULTIVITAMIN) TABS tablet Take 1 tablet by mouth daily at 12 noon.       Allergies  Allergen Reactions  . Salicylates Anaphylaxis, Hives and Shortness Of Breath    ROS: Pertinent items in HPI  OBJECTIVE Blood pressure 116/78, pulse 84, temperature 98.4 F (36.9 C), temperature source Oral, resp. rate 20, height 5\' 6"  (1.676 m), weight 122.018 kg (269 lb), last menstrual period 06/28/2013. GENERAL: Well-developed, well-nourished female in mild discomfort. HEENT: Normocephalic HEART: normal rate RESP: normal effort ABDOMEN: Soft, obese, moderately tender rt suprapubic region and rt groin. DT 152 EXTREMITIES: Nontender, no edema NEURO: Alert and oriented BIMANUAL: cervix L/C, uterus 14-16 wk size, rt adnexal tenderness, no mass (exam limited by body habitus)  LAB RESULTS No results found for  this or any previous visit (from the past 24 hour(s)).  IMAGING No results found.  MAU COURSE Acetaminophen 1000 mg po given C/W Dr. Henderson Cloud D/C   ASSESSMENT 1. Groin strain   P0222 at 14 wks, viable IUP Acute musculoskeletal stain vs. Ruptured ovarian cyst  PLAN Discharge home with reassurance. Use Tylenol for pain. AVS on muscle strain   Medication List         acetaminophen 500 MG tablet  Commonly known as:  TYLENOL  Take 1,000 mg by mouth every 4 (four) hours as needed for headache.     aspirin 81 MG chewable tablet  Chew 81 mg by mouth daily.     oseltamivir 75 MG capsule  Commonly known as:  TAMIFLU  Take 75 mg by mouth daily.     polyethylene glycol packet  Commonly known as:  MIRALAX / GLYCOLAX  Take 17 g by mouth daily as needed for mild constipation.     prenatal multivitamin Tabs tablet  Take 1 tablet by mouth daily at 12 noon.       Follow-up Information   Follow up with  Philip Aspen, DO. (Keep your regular appointment)    Specialty:  Obstetrics and Gynecology   Contact information:   [address] [place] [address] [phone]       Danae Orleans, CNM 10/04/2013  6:19 PM

## 2013-10-04 NOTE — MAU Note (Signed)
8268yr old son has the flu, they are on tamiflu prophylactic.

## 2013-10-10 ENCOUNTER — Encounter (HOSPITAL_COMMUNITY): Payer: Self-pay | Admitting: General Practice

## 2013-10-10 ENCOUNTER — Inpatient Hospital Stay (HOSPITAL_COMMUNITY): Payer: 59

## 2013-10-10 ENCOUNTER — Inpatient Hospital Stay (HOSPITAL_COMMUNITY)
Admission: AD | Admit: 2013-10-10 | Discharge: 2013-10-10 | Disposition: A | Discharge status: Home / Self Care | Payer: 59 | Source: Ambulatory Visit | Attending: Obstetrics and Gynecology | Admitting: Obstetrics and Gynecology

## 2013-10-10 DIAGNOSIS — R1031 Right lower quadrant pain: Secondary | ICD-10-CM | POA: Insufficient documentation

## 2013-10-10 DIAGNOSIS — O9989 Other specified diseases and conditions complicating pregnancy, childbirth and the puerperium: Secondary | ICD-10-CM

## 2013-10-10 DIAGNOSIS — O26899 Other specified pregnancy related conditions, unspecified trimester: Secondary | ICD-10-CM

## 2013-10-10 DIAGNOSIS — N83209 Unspecified ovarian cyst, unspecified side: Secondary | ICD-10-CM | POA: Insufficient documentation

## 2013-10-10 DIAGNOSIS — N83201 Unspecified ovarian cyst, right side: Secondary | ICD-10-CM

## 2013-10-10 DIAGNOSIS — O34599 Maternal care for other abnormalities of gravid uterus, unspecified trimester: Secondary | ICD-10-CM | POA: Insufficient documentation

## 2013-10-10 LAB — CBC
HCT: 33 % — ABNORMAL LOW (ref 36.0–46.0)
Hemoglobin: 11.1 g/dL — ABNORMAL LOW (ref 12.0–15.0)
MCH: 28 pg (ref 26.0–34.0)
MCHC: 33.6 g/dL (ref 30.0–36.0)
MCV: 83.1 fL (ref 78.0–100.0)
PLATELETS: 377 10*3/uL (ref 150–400)
RBC: 3.97 MIL/uL (ref 3.87–5.11)
RDW: 14.9 % (ref 11.5–15.5)
WBC: 13.4 10*3/uL — ABNORMAL HIGH (ref 4.0–10.5)

## 2013-10-10 MED ORDER — HYDROMORPHONE HCL PF 1 MG/ML IJ SOLN
1.0000 mg | Freq: Once | INTRAMUSCULAR | Status: AC
  Administered 2013-10-10: 1 mg via INTRAMUSCULAR
  Filled 2013-10-10: qty 1

## 2013-10-10 MED ORDER — OXYCODONE-ACETAMINOPHEN 5-325 MG PO TABS: 1.0000 | ORAL_TABLET | ORAL | Status: DC | PRN

## 2013-10-10 NOTE — MAU Note (Signed)
Known cyst on rt side.  Pain has gotten worse today.  Called office, was told to come in - ? Torsion.

## 2013-10-10 NOTE — MAU Provider Note (Signed)
History     CSN: [id number]  Arrival date and time: 10/10/13 1800   First Provider Initiated Contact with Patient 10/10/13 1841      Chief Complaint  Patient presents with  . Abdominal Pain  . Ovarian Cyst   HPI  Sheryl [name] is  A.age [id number] at [redacted]w[redacted]d who presents today with 10/10 RLQ pain. She states that the pain started around 1300 today. She took dose of dilaudid PO at home, and it has not helped with the pain. She called the office and was told to come here. She denies N/V/D or fever. She has a known right ovarian cyst, and they wanted to check for ovarian torsion.   Past Medical History  Diagnosis Date  . Migraines    OB History  Gravida Para Term Preterm AB SAB TAB Ectopic Multiple Living  5 2  2 2 2    2     # Outcome Date GA Lbr Len/2nd Weight Sex Delivery Anes PTL Lv  5 CUR           4 PRE           3 PRE           2 SAB           1 SAB               Past Surgical History  Procedure Laterality Date  . Breast surgery      cyst removed from left breast; benign    History reviewed. No pertinent family history.  History  Substance Use Topics  . Smoking status: Never Smoker   . Smokeless tobacco: Never Used  . Alcohol Use: No    Allergies:  Allergies  Allergen Reactions  . Salicylates Anaphylaxis, Hives and Shortness Of Breath    Prescriptions prior to admission  Medication Sig Dispense Refill  . acetaminophen (TYLENOL) 500 MG tablet Take 1,000 mg by mouth every 4 (four) hours as needed for headache.      Marland Kitchen aspirin 81 MG chewable tablet Chew 81 mg by mouth daily.      Marland Kitchen oseltamivir (TAMIFLU) 75 MG capsule Take 75 mg by mouth daily.      Marland Kitchen oxyCODONE-acetaminophen (PERCOCET/ROXICET) 5-325 MG per tablet Take by mouth every 4 (four) hours as needed for severe pain.      . polyethylene glycol (MIRALAX / GLYCOLAX) packet Take 17 g by mouth daily as needed for mild constipation.      . Prenatal Vit-Fe Fumarate-FA (PRENATAL MULTIVITAMIN) TABS tablet Take 1  tablet by mouth daily at 12 noon.        ROS Physical Exam   Blood pressure 113/77, pulse 84, temperature 98.1 F (36.7 C), temperature source Oral, resp. rate 20, last menstrual period 06/28/2013.  Physical Exam  Nursing note and vitals reviewed. Constitutional: She is oriented to person, place, and time. She appears well-developed and well-nourished. No distress.  Cardiovascular: Normal rate.   Respiratory: Effort normal.  GI: Soft. There is tenderness (on the right ). There is rebound (on the right ).  Neurological: She is alert and oriented to person, place, and time.  Skin: Skin is warm and dry.  Psychiatric: She has a normal mood and affect.   FHR 148 by doppler  MAU Course  Procedures  Results for orders placed during the hospital encounter of 10/10/13 (from the past 24 hour(s))  CBC     Status: Abnormal   Collection Time    10/10/13  6:49 PM      Result Value Range   WBC 13.4 (*) 4.0 - 10.5 K/uL   RBC 3.97  3.87 - 5.11 MIL/uL   Hemoglobin 11.1 (*) 12.0 - 15.0 g/dL   HCT 54.0 (*) 98.1 - 19.1 %   MCV 83.1  78.0 - 100.0 fL   MCH 28.0  26.0 - 34.0 pg   MCHC 33.6  30.0 - 36.0 g/dL   RDW 47.8  29.5 - 62.1 %   Platelets 377  150 - 400 K/uL   2000: Care turned over to V. Katrinka Blazing, CNM  Assessment and Plan   Sheryl [name] 10/10/2013, 6:45 PM   Sheryl Kinsman, CNM assumed care of pt at 2000.  Imaging US Pelvis Complete  10/10/2013   CLINICAL DATA:  Right lower quadrant abdominal pain. Known the large right ovarian cyst. The patient is pregnant.  EXAM: TRANSABDOMINAL ULTRASOUND OF PELVIS  DOPPLER ULTRASOUND OF OVARIES  TECHNIQUE: Transabdominal ultrasound examination of the pelvis was performed including evaluation of the ovaries, adnexal regions, and pelvic cul-de-sac.  Color and duplex Doppler ultrasound was utilized to evaluate blood flow to the ovaries.  COMPARISON:  None.  FINDINGS: Uterus  The patient is known to be pregnant and concern was particularly for the  head ovaries ; the uterus and pregnancy were not assessed.  Right ovary  Measurements: 6.4 x 7.4 by 5.7 cm There is a 6.3 x 6.1 x 5.1 cm simple cyst in the ovary.  Left ovary  Measurements: 3.8 x 1.7 x 2.1 cm Normal appearance/no adnexal mass.  Pulsed Doppler evaluation demonstrates normal low-resistance arterial and venous waveforms in both ovaries.  No free pelvic fluid.  IMPRESSION: 1. No findings of torsion. There is a 6.3 cm simple cyst in the right ovary. 2. The uterus and pregnancy were not assessed.   Electronically Signed   By: Herbie Baltimore M.D.   On: 10/10/2013 20:20   [place] Art/ven Flow Abd Pelv Doppler  10/10/2013   CLINICAL DATA:  Right lower quadrant abdominal pain. Known the large right ovarian cyst. The patient is pregnant.  EXAM: TRANSABDOMINAL ULTRASOUND OF PELVIS  DOPPLER ULTRASOUND OF OVARIES  TECHNIQUE: Transabdominal ultrasound examination of the pelvis was performed including evaluation of the ovaries, adnexal regions, and pelvic cul-de-sac.  Color and duplex Doppler ultrasound was utilized to evaluate blood flow to the ovaries.  COMPARISON:  None.  FINDINGS: Uterus  The patient is known to be pregnant and concern was particularly for the head ovaries ; the uterus and pregnancy were not assessed.  Right ovary  Measurements: 6.4 x 7.4 by 5.7 cm There is a 6.3 x 6.1 x 5.1 cm simple cyst in the ovary.  Left ovary  Measurements: 3.8 x 1.7 x 2.1 cm Normal appearance/no adnexal mass.  Pulsed Doppler evaluation demonstrates normal low-resistance arterial and venous waveforms in both ovaries.  No free pelvic fluid.  IMPRESSION: 1. No findings of torsion. There is a 6.3 cm simple cyst in the right ovary. 2. The uterus and pregnancy were not assessed.   Electronically Signed   By: Herbie Baltimore M.D.   On: 10/10/2013 20:20   Rates pain 2-3/10.  ASSESSMENT 1. Pregnancy with abdominal pain of right lower quadrant, antepartum   2. Right ovarian cyst     PLAN: D/C home pr consult w/ Dr.  Tenny Craw. Sx not characteristic of appendicitis, but precautions reviewed.    Medication List         acetaminophen 500 MG tablet  Commonly known as:  TYLENOL  Take 1,000 mg by mouth every 4 (four) hours as needed for headache.     aspirin 81 MG chewable tablet  Chew 81 mg by mouth daily.     oseltamivir 75 MG capsule  Commonly known as:  TAMIFLU  Take 75 mg by mouth daily.     oxyCODONE-acetaminophen 5-325 MG per tablet  Commonly known as:  PERCOCET/ROXICET  Take 1-2 tablets by mouth every 4 (four) hours as needed for severe pain.     polyethylene glycol packet  Commonly known as:  MIRALAX / GLYCOLAX  Take 17 g by mouth daily as needed for mild constipation.     prenatal multivitamin Tabs tablet  Take 1 tablet by mouth daily at 12 noon.       Follow-up Information   Follow [facility] with [facility] On 10/15/2013. (as scheduled or as needed if symptoms worsen)    Contact information:   [address] [place] [id number] [phone]      [facility] with [facility]. (As needed in emergencies)    Contact information:   [address] [id number] [place] [address] [phone]      Sheryl KinsmanVirginia Treniece [name], [place] 10/10/2013 8:44 PM

## 2013-10-10 NOTE — Discharge Instructions (Signed)
Abdominal Pain During Pregnancy Abdominal pain is common in pregnancy. Most of the time, it does not cause harm. There are many causes of abdominal pain. Some causes are more serious than others. Some of the causes of abdominal pain in pregnancy are easily diagnosed. Occasionally, the diagnosis takes time to understand. Other times, the cause is not determined. Abdominal pain can be a sign that something is very wrong with the pregnancy, or the pain may have nothing to do with the pregnancy at all. For this reason, always tell your health care provider if you have any abdominal discomfort. HOME CARE INSTRUCTIONS  Monitor your abdominal pain for any changes. The following actions may help to alleviate any discomfort you are experiencing:  Do not have sexual intercourse or put anything in your vagina until your symptoms go away completely.  Get plenty of rest until your pain improves.  Drink clear fluids if you feel nauseous. Avoid solid food as long as you are uncomfortable or nauseous.  Only take over-the-counter or prescription medicine as directed by your health care provider.  Keep all follow-up appointments with your health care provider. SEEK IMMEDIATE MEDICAL CARE IF:  You are bleeding, leaking fluid, or passing tissue from the vagina.  You have increasing pain or cramping.  You have persistent vomiting.  You have painful or bloody urination.  You have a fever.  You notice a decrease in your baby's movements.  You have extreme weakness or feel faint.  You have shortness of breath, with or without abdominal pain.  You develop a severe headache with abdominal pain.  You have abnormal vaginal discharge with abdominal pain.  You have persistent diarrhea.  You have abdominal pain that continues even after rest, or gets worse. MAKE SURE YOU:   Understand these instructions.  Will watch your condition.  Will get help right away if you are not doing well or get  worse. Document Released: 08/23/2005 Document Revised: 06/13/2013 Document Reviewed: 03/22/2013 [facility] Patient Information 2014 [place].   Appendicitis Appendicitis is when the appendix is swollen (inflamed). The inflammation can lead to developing a hole (perforation) and a collection of pus (abscess). CAUSES  There is not always an obvious cause of appendicitis. Sometimes it is caused by an obstruction in the appendix. The obstruction can be caused by:  A small, hard, pea-sized ball of stool (fecalith).  Enlarged lymph glands in the appendix. SYMPTOMS   Pain around your belly button (navel) that moves toward your lower right belly (abdomen). The pain can become more severe and sharp as time passes.  Tenderness in the lower right abdomen. Pain gets worse if you cough or make a sudden movement.  Feeling sick to your stomach (nauseous).  Throwing up (vomiting).  Loss of appetite.  Fever.  Constipation.  Diarrhea.  Generally not feeling well. DIAGNOSIS   Physical exam.  Blood tests.  Urine test.  X-rays or a CT scan may confirm the diagnosis. TREATMENT  Once the diagnosis of appendicitis is made, the most common treatment is to remove the appendix as soon as possible. This procedure is called appendectomy. In an open appendectomy, a cut (incision) is made in the lower right abdomen and the appendix is removed. In a laparoscopic appendectomy, usually 3 small incisions are made. Long, thin instruments and a camera tube are used to remove the appendix. Most patients go home in 24 to 48 hours after appendectomy. In some situations, the appendix may have already perforated and an abscess may have formed.  The abscess may have a "wall" around it as seen on a CT scan. In this case, a drain may be placed into the abscess to remove fluid, and you may be treated with antibiotic medicines that kill germs. The medicine is given through a tube in your vein (IV). Once the abscess  has resolved, it may or may not be necessary to have an appendectomy. You may need to stay in the hospital longer than 48 hours. Document Released: 08/23/2005 Document Revised: 02/22/2012 Document Reviewed: 11/18/2009 [facility] Patient Information 2014 [place], [place]. Marland Kitchen

## 2013-10-10 NOTE — MAU Note (Signed)
Pt presents to MAU from home with c/o severe pain on the right side of her abdomen. Pt. Has a known ovarian cyst she states on her rt side that was found at the beginning of her pregnancy.  Her MD's want to make sure that she does not have a torsion of the cyst.

## 2013-11-13 ENCOUNTER — Other Ambulatory Visit: Payer: Self-pay

## 2013-11-16 ENCOUNTER — Other Ambulatory Visit (HOSPITAL_COMMUNITY): Payer: Self-pay | Admitting: Obstetrics and Gynecology

## 2013-11-16 DIAGNOSIS — Z3689 Encounter for other specified antenatal screening: Secondary | ICD-10-CM

## 2013-11-21 ENCOUNTER — Encounter (HOSPITAL_COMMUNITY): Payer: Self-pay

## 2013-11-21 ENCOUNTER — Inpatient Hospital Stay (HOSPITAL_COMMUNITY)
Admission: AD | Admit: 2013-11-21 | Discharge: 2013-11-21 | Disposition: A | Discharge status: Home / Self Care | Payer: 59 | Source: Ambulatory Visit | Attending: Obstetrics and Gynecology | Admitting: Obstetrics and Gynecology

## 2013-11-21 ENCOUNTER — Inpatient Hospital Stay (HOSPITAL_COMMUNITY): Payer: 59

## 2013-11-21 DIAGNOSIS — M549 Dorsalgia, unspecified: Secondary | ICD-10-CM

## 2013-11-21 DIAGNOSIS — N949 Unspecified condition associated with female genital organs and menstrual cycle: Secondary | ICD-10-CM

## 2013-11-21 DIAGNOSIS — N83209 Unspecified ovarian cyst, unspecified side: Secondary | ICD-10-CM | POA: Insufficient documentation

## 2013-11-21 DIAGNOSIS — O34599 Maternal care for other abnormalities of gravid uterus, unspecified trimester: Secondary | ICD-10-CM | POA: Insufficient documentation

## 2013-11-21 DIAGNOSIS — O99891 Other specified diseases and conditions complicating pregnancy: Secondary | ICD-10-CM

## 2013-11-21 DIAGNOSIS — R109 Unspecified abdominal pain: Secondary | ICD-10-CM | POA: Insufficient documentation

## 2013-11-21 DIAGNOSIS — O9989 Other specified diseases and conditions complicating pregnancy, childbirth and the puerperium: Principal | ICD-10-CM

## 2013-11-21 LAB — URINALYSIS, ROUTINE W REFLEX MICROSCOPIC
Bilirubin Urine: NEGATIVE
Glucose, UA: NEGATIVE mg/dL
Hgb urine dipstick: NEGATIVE
Ketones, ur: NEGATIVE mg/dL
LEUKOCYTES UA: NEGATIVE
NITRITE: NEGATIVE
PH: 6 (ref 5.0–8.0)
Protein, ur: NEGATIVE mg/dL
SPECIFIC GRAVITY, URINE: 1.01 (ref 1.005–1.030)
Urobilinogen, UA: 0.2 mg/dL (ref 0.0–1.0)

## 2013-11-21 MED ORDER — CYCLOBENZAPRINE HCL 10 MG PO TABS
10.0000 mg | ORAL_TABLET | Freq: Once | ORAL | Status: AC
  Administered 2013-11-21: 10 mg via ORAL
  Filled 2013-11-21: qty 1

## 2013-11-21 MED ORDER — CYCLOBENZAPRINE HCL 5 MG PO TABS: 5.0000 mg | ORAL_TABLET | Freq: Three times a day (TID) | ORAL | Status: DC | PRN

## 2013-11-21 NOTE — Discharge Instructions (Signed)

## 2013-11-21 NOTE — MAU Note (Signed)
Lower abd cramping since this a.m., has gotten worse.  Severe lower back pain, worse in the past 2 hours,  Denies bleeding or LOF.  Dx'd with ovarian cyst in December.

## 2013-11-21 NOTE — MAU Provider Note (Signed)
Chief Complaint:  Abdominal Pain and Back Pain   First Provider Initiated Contact with Patient 11/21/13 1805      HPI: Sheryl [name] is a 43 y.o. [id number] at [redacted]w[redacted]d who presents to maternity admissions reporting lower abdominal and low back pain that is constant and increasingly severe since onset last night. She fell crampy all night. Was more active physically yesterday than usual. Pain is worse with moving and better with hips flexed lying down. Tried a Percocet 4 hrs ago without relief. Denies contractions, leakage of fluid or vaginal bleeding. Good fetal movement.   Pregnancy Course: PNC at [facility], primary Dr. Claiborne Billings. Significant for obesity; Hx PTB;  rt ovarian simple cyst 6.3 cm (Feb 2015)  Past Medical History: Past Medical History  Diagnosis Date  . Migraines     Past obstetric history: OB History  Gravida Para Term Preterm AB SAB TAB Ectopic Multiple Living  5 2  2 2 2    2     # Outcome Date GA Lbr Len/2nd Weight Sex Delivery Anes PTL Lv  5 CUR           4 PRE           3 PRE           2 SAB           1 SAB               Past Surgical History: Past Surgical History  Procedure Laterality Date  . Breast surgery      cyst removed from left breast; benign     Family History: History reviewed. No pertinent family history.  Social History: History  Substance Use Topics  . Smoking status: Never Smoker   . Smokeless tobacco: Never Used  . Alcohol Use: No    Allergies:  Allergies  Allergen Reactions  . Salicylates Anaphylaxis, Hives and Shortness Of Breath    Meds:  Prescriptions prior to admission  Medication Sig Dispense Refill  . acetaminophen (TYLENOL) 500 MG tablet Take 1,000 mg by mouth every 4 (four) hours as needed for headache.      Marland Kitchen aspirin 81 MG chewable tablet Chew 81 mg by mouth daily.      . fluticasone (FLONASE) 50 MCG/ACT nasal spray Place 1 spray into both nostrils daily as needed for allergies or rhinitis.      Marland Kitchen oxyCODONE-acetaminophen  (PERCOCET/ROXICET) 5-325 MG per tablet Take 1-2 tablets by mouth every 4 (four) hours as needed for severe pain.  30 tablet  0  . polyethylene glycol (MIRALAX / GLYCOLAX) packet Take 17 g by mouth daily as needed for mild constipation.      . Prenatal Vit-Fe Fumarate-FA (PRENATAL MULTIVITAMIN) TABS tablet Take 1 tablet by mouth daily at 12 noon.        ROS: Pertinent findings in history of present illness.  Physical Exam  Blood pressure 126/80, pulse 81, temperature 97.6 F (36.4 C), temperature source Oral, resp. rate 22, height 5\' 7"  (1.702 m), weight 122.925 kg (271 lb), last menstrual period 06/28/2013. GENERAL: Obese female in no acute distress.  HEENT: normocephalic HEART: normal rate RESP: normal effort ABDOMEN: Soft, non-tender, gravid with fundus at umbilicus. DT FHR 138 EXTREMITIES: Nontender, no edema NEURO: alert and oriented SPECULUM EXAM: NEFG, physiologic discharge, no blood, cervix clean SVE: ext 1/ long/closed/PP high    Labs: Results for orders placed during the hospital encounter of 11/21/13 (from the past 24 hour(s))  URINALYSIS, ROUTINE W REFLEX MICROSCOPIC  Status: None   Collection Time    11/21/13  5:25 PM      Result Value Ref Range   Color, Urine YELLOW  YELLOW   APPearance CLEAR  CLEAR   Specific Gravity, Urine 1.010  1.005 - 1.030   pH 6.0  5.0 - 8.0   Glucose, UA NEGATIVE  NEGATIVE mg/dL   Hgb urine dipstick NEGATIVE  NEGATIVE   Bilirubin Urine NEGATIVE  NEGATIVE   Ketones, ur NEGATIVE  NEGATIVE mg/dL   Protein, ur NEGATIVE  NEGATIVE mg/dL   Urobilinogen, UA 0.2  0.0 - 1.0 mg/dL   Nitrite NEGATIVE  NEGATIVE   Leukocytes, UA NEGATIVE  NEGATIVE    Imaging:  US Pelvis Limited  11/21/2013   CLINICAL DATA:  Right ovarian cyst, followup, question torsion, [redacted] weeks pregnant  EXAM: TRANSABDOMINAL ULTRASOUND OF PELVIS  DOPPLER ULTRASOUND OF OVARIES  TECHNIQUE: Transabdominal ultrasound examination of the pelvis was performed including evaluation of  the uterus, ovaries, adnexal regions, and pelvic cul-de-sac.  Color and duplex Doppler ultrasound was utilized to evaluate blood flow to the ovaries.  COMPARISON:  10/10/2013  FINDINGS: Uterus  Gravid, not assessed  Endometrium  N/A  Right ovary  Measurements: 8.2 x 6.5 x 7.6 cm. Large cyst with simple features within right ovary measuring 5.6 x 6.5 x 6.4 cm, previously 5.1 x 6.3 x 6.1 cm. Blood flow seen within the margin of the cyst and within ovary on Doppler imaging.  Left ovary  Measurements: 3.1 x 1.6 x 1.8 cm. Normal morphology without mass.  Pulsed Doppler evaluation demonstrates normal low-resistance arterial and venous waveforms in both ovaries.  IMPRESSION: Gravid uterus, not assessed.  Enlarged simple appearing cyst within right ovary measuring 5.6 x 6.5 x 6.4 cm, grossly unchanged.  No evidence of right ovarian torsion.  Normal appearing left ovary.   Electronically Signed   By: Ulyses Southward M.D.   On: 11/21/2013 20:09   [place] Art/ven Flow Abd Pelv Doppler  11/21/2013   CLINICAL DATA:  Right ovarian cyst, followup, question torsion, [redacted] weeks pregnant  EXAM: TRANSABDOMINAL ULTRASOUND OF PELVIS  DOPPLER ULTRASOUND OF OVARIES  TECHNIQUE: Transabdominal ultrasound examination of the pelvis was performed including evaluation of the uterus, ovaries, adnexal regions, and pelvic cul-de-sac.  Color and duplex Doppler ultrasound was utilized to evaluate blood flow to the ovaries.  COMPARISON:  10/10/2013  FINDINGS: Uterus  Gravid, not assessed  Endometrium  N/A  Right ovary  Measurements: 8.2 x 6.5 x 7.6 cm. Large cyst with simple features within right ovary measuring 5.6 x 6.5 x 6.4 cm, previously 5.1 x 6.3 x 6.1 cm. Blood flow seen within the margin of the cyst and within ovary on Doppler imaging.  Left ovary  Measurements: 3.1 x 1.6 x 1.8 cm. Normal morphology without mass.  Pulsed Doppler evaluation demonstrates normal low-resistance arterial and venous waveforms in both ovaries.  IMPRESSION: Gravid uterus,  not assessed.  Enlarged simple appearing cyst within right ovary measuring 5.6 x 6.5 x 6.4 cm, grossly unchanged.  No evidence of right ovarian torsion.  Normal appearing left ovary.   Electronically Signed   By: Ulyses Southward M.D.   On: 11/21/2013 20:09    MAU Course: Flexeril 10 mg po given with relief  Assessment: 1. Round ligament pain   2. Back pain complicating pregnancy in second trimester   3. Other and unspecified ovarian cyst   [id number] at [redacted]w[redacted]d  Plan: C/W Dr. Henderson Cloud Discharge home     Medication List  STOP taking these medications       oxyCODONE-acetaminophen 5-325 MG per tablet  Commonly known as:  PERCOCET/ROXICET      TAKE these medications       acetaminophen 500 MG tablet  Commonly known as:  TYLENOL  Take 1,000 mg by mouth every 4 (four) hours as needed for headache.     aspirin 81 MG chewable tablet  Chew 81 mg by mouth daily.     cyclobenzaprine 5 MG tablet  Commonly known as:  FLEXERIL  Take 1 tablet (5 mg total) by mouth 3 (three) times daily as needed for muscle spasms.     fluticasone 50 MCG/ACT nasal spray  Commonly known as:  FLONASE  Place 1 spray into both nostrils daily as needed for allergies or rhinitis.     polyethylene glycol packet  Commonly known as:  MIRALAX / GLYCOLAX  Take 17 g by mouth daily as needed for mild constipation.     prenatal multivitamin Tabs tablet  Take 1 tablet by mouth daily at 12 noon.       Follow-up Information   Follow up with Philip AspenALLAHAN, SIDNEY, DO. (Keep your scheduled prenatal appointment)    Specialty:  Obstetrics and Gynecology   Contact information:   [address] [place] [address] [phone]       Sheryl [name], CNM 11/21/2013 6:21 PM

## 2013-11-26 ENCOUNTER — Ambulatory Visit (HOSPITAL_COMMUNITY)
Admission: RE | Admit: 2013-11-26 | Discharge: 2013-11-26 | Disposition: A | Discharge status: Home / Self Care | Payer: 59 | Source: Ambulatory Visit | Attending: Obstetrics and Gynecology | Admitting: Obstetrics and Gynecology

## 2013-11-26 DIAGNOSIS — Z363 Encounter for antenatal screening for malformations: Secondary | ICD-10-CM | POA: Insufficient documentation

## 2013-11-26 DIAGNOSIS — O358XX0 Maternal care for other (suspected) fetal abnormality and damage, not applicable or unspecified: Secondary | ICD-10-CM | POA: Insufficient documentation

## 2013-11-26 DIAGNOSIS — O99210 Obesity complicating pregnancy, unspecified trimester: Secondary | ICD-10-CM

## 2013-11-26 DIAGNOSIS — Z3689 Encounter for other specified antenatal screening: Secondary | ICD-10-CM

## 2013-11-26 DIAGNOSIS — Z8751 Personal history of pre-term labor: Secondary | ICD-10-CM | POA: Insufficient documentation

## 2013-11-26 DIAGNOSIS — E669 Obesity, unspecified: Secondary | ICD-10-CM | POA: Insufficient documentation

## 2013-11-26 DIAGNOSIS — Z1389 Encounter for screening for other disorder: Secondary | ICD-10-CM | POA: Insufficient documentation

## 2013-11-26 DIAGNOSIS — O09529 Supervision of elderly multigravida, unspecified trimester: Secondary | ICD-10-CM | POA: Insufficient documentation

## 2013-12-30 ENCOUNTER — Inpatient Hospital Stay (HOSPITAL_COMMUNITY)
Admission: AD | Admit: 2013-12-30 | Discharge: 2013-12-30 | Disposition: A | Discharge status: Home / Self Care | Payer: 59 | Source: Ambulatory Visit | Attending: Obstetrics and Gynecology | Admitting: Obstetrics and Gynecology

## 2013-12-30 ENCOUNTER — Encounter (HOSPITAL_COMMUNITY): Payer: Self-pay | Admitting: *Deleted

## 2013-12-30 DIAGNOSIS — K5900 Constipation, unspecified: Secondary | ICD-10-CM | POA: Insufficient documentation

## 2013-12-30 DIAGNOSIS — M62838 Other muscle spasm: Secondary | ICD-10-CM | POA: Insufficient documentation

## 2013-12-30 DIAGNOSIS — R42 Dizziness and giddiness: Secondary | ICD-10-CM | POA: Insufficient documentation

## 2013-12-30 LAB — GLUCOSE, CAPILLARY: Glucose-Capillary: 107 mg/dL — ABNORMAL HIGH (ref 70–99)

## 2013-12-30 NOTE — MAU Provider Note (Signed)
History     CSN: [id number]  Arrival date and time: 12/30/13 [id number]   First Provider Initiated Contact with Patient 12/30/13 2008      No chief complaint on file.  HPI  Sheryl [name] is a 43 y.o. [id number] at 8572w4d who presents today after a dizzy spell. She states that she was at her baby shower from 2-4 today. Then she went home, and was sitting with her legs crossed for about an hour and half. She got up quickly to go to the kitchen and then the room went black. Her husband and son are present, and state that they grabbed her and lowered her to the ground, but that she did not lose consciousness. She did not hit her abdomen or her head. She denies any abdominal pain, LOF or VB, and confirms fetal movement.   Past Medical History  Diagnosis Date  . Migraines     Past Surgical History  Procedure Laterality Date  . Breast surgery      cyst removed from left breast; benign    History reviewed. No pertinent family history.  History  Substance Use Topics  . Smoking status: Never Smoker   . Smokeless tobacco: Never Used  . Alcohol Use: No    Allergies:  Allergies  Allergen Reactions  . Salicylates Anaphylaxis, Hives and Shortness Of Breath    Prescriptions prior to admission  Medication Sig Dispense Refill  . acetaminophen (TYLENOL) 500 MG tablet Take 1,000 mg by mouth every 4 (four) hours as needed for headache.      Marland Kitchen. aspirin 81 MG chewable tablet Chew 81 mg by mouth daily.      . cyclobenzaprine (FLEXERIL) 5 MG tablet Take 1 tablet (5 mg total) by mouth 3 (three) times daily as needed for muscle spasms.  30 tablet  0  . fluticasone (FLONASE) 50 MCG/ACT nasal spray Place 1 spray into both nostrils daily as needed for allergies or rhinitis.      . polyethylene glycol (MIRALAX / GLYCOLAX) packet Take 17 g by mouth daily as needed for mild constipation.      . Prenatal Vit-Fe Fumarate-FA (PRENATAL MULTIVITAMIN) TABS tablet Take 1 tablet by mouth daily at 12 noon.         ROS Physical Exam   Blood pressure 117/76, pulse 88, temperature 98.4 F (36.9 C), temperature source Oral, resp. rate 16, height 5\' 7"  (1.702 m), weight 121.564 kg (268 lb), last menstrual period 06/28/2013, SpO2 98.00%.  Physical Exam  Nursing note and vitals reviewed. Constitutional: She is oriented to person, place, and time. She appears well-developed and well-nourished. No distress.  Cardiovascular: Normal rate.   Respiratory: Effort normal.  GI: Soft. There is no tenderness. There is no rebound.  Neurological: She is alert and oriented to person, place, and time.  Skin: Skin is warm and dry.  Psychiatric: She has a normal mood and affect.    MAU Course  Procedures  Results for orders placed during the hospital encounter of 12/30/13 (from the past 24 hour(s))  GLUCOSE, CAPILLARY     Status: Abnormal   Collection Time    12/30/13  8:15 PM      Result Value Ref Range   Glucose-Capillary 107 (*) 70 - 99 mg/dL   Comment 1 Notify RN     2029: C/W Dr. Tenny Crawoss, patient is ok for dc home.  Assessment and Plan   1. Orthostatic dizziness    3rd trimester precautions reviewed Return to MAU as needed  Increased fluids When getting up to stand, do so slowly  Follow-up Information   [facility] with [facility] (As scheduled)    Contact information:   [address] [place] [id number] [phone]       Tawnya CrookHeather Donovan Hogan 12/30/2013, 8:47 PM

## 2013-12-30 NOTE — MAU Note (Signed)
Pt had her baby shower today from 2-4, pt walked to kitchen and she reports dizziness and said the room was spinning, her husband and son caught her and lowered her to the ground. She did not lose consciousness.

## 2013-12-30 NOTE — Discharge Instructions (Signed)
Dizziness °Dizziness is a common problem. It is a feeling of unsteadiness or lightheadedness. You may feel like you are about to faint. Dizziness can lead to injury if you stumble or fall. A person of any age group can suffer from dizziness, but dizziness is more common in older adults. °CAUSES  °Dizziness can be caused by many different things, including: °· Middle ear problems. °· Standing for too long. °· Infections. °· An allergic reaction. °· Aging. °· An emotional response to something, such as the sight of blood. °· Side effects of medicines. °· Fatigue. °· Problems with circulation or blood pressure. °· Excess use of alcohol, medicines, or illegal drug use. °· Breathing too fast (hyperventilation). °· An arrhythmia or problems with your heart rhythm. °· Low red blood cell count (anemia). °· Pregnancy. °· Vomiting, diarrhea, fever, or other illnesses that cause dehydration. °· Diseases or conditions such as Parkinson's disease, high blood pressure (hypertension), diabetes, and thyroid problems. °· Exposure to extreme heat. °DIAGNOSIS  °To find the cause of your dizziness, your caregiver may do a physical exam, lab tests, radiologic imaging scans, or an electrocardiography test (ECG).  °TREATMENT  °Treatment of dizziness depends on the cause of your symptoms and can vary greatly. °HOME CARE INSTRUCTIONS  °· Drink enough fluids to keep your urine clear or pale yellow. This is especially important in very hot weather. In the elderly, it is also important in cold weather. °· If your dizziness is caused by medicines, take them exactly as directed. When taking blood pressure medicines, it is especially important to get up slowly. °· Rise slowly from chairs and steady yourself until you feel okay. °· In the morning, first sit up on the side of the bed. When this seems okay, stand slowly while holding onto something until you know your balance is fine. °· If you need to stand in one place for a long time, be sure to  move your legs often. Tighten and relax the muscles in your legs while standing. °· If dizziness continues to be a problem, have someone stay with you for a day or two. Do this until you feel you are well enough to stay alone. Have the person call your caregiver if he or she notices changes in you that are concerning. °· Do not drive or use heavy machinery if you feel dizzy. °· Do not drink alcohol. °SEEK IMMEDIATE MEDICAL CARE IF:  °· Your dizziness or lightheadedness gets worse. °· You feel nauseous or vomit. °· You develop problems with talking, walking, weakness, or using your arms, hands, or legs. °· You are not thinking clearly or you have difficulty forming sentences. It may take a friend or family member to determine if your thinking is normal. °· You develop chest pain, abdominal pain, shortness of breath, or sweating. °· Your vision changes. °· You notice any bleeding. °· You have side effects from medicine that seems to be getting worse rather than better. °MAKE SURE YOU:  °· Understand these instructions. °· Will watch your condition. °· Will get help right away if you are not doing well or get worse. °Document Released: 02/16/2001 Document Revised: 11/15/2011 Document Reviewed: 03/12/2011 °ExitCare® Patient Information ©2014 [employer]. ° °Third Trimester of Pregnancy °The third trimester is from week 29 through week 42, months 7 through 9. The third trimester is a time when the fetus is growing rapidly. At the end of the ninth month, the fetus is about 20 inches in length and weighs 6 10   pounds.  °BODY CHANGES °Your body goes through many changes during pregnancy. The changes vary from woman to woman.  °· Your weight will continue to increase. You can expect to gain 25 35 pounds (11 16 kg) by the end of the pregnancy. °· You may begin to get stretch marks on your hips, abdomen, and breasts. °· You may urinate more often because the fetus is moving lower into your pelvis and pressing on your  bladder. °· You may develop or continue to have heartburn as a result of your pregnancy. °· You may develop constipation because certain hormones are causing the muscles that push waste through your intestines to slow down. °· You may develop hemorrhoids or swollen, bulging veins (varicose veins). °· You may have pelvic pain because of the weight gain and pregnancy hormones relaxing your joints between the bones in your pelvis. Back aches may result from over exertion of the muscles supporting your posture. °· Your breasts will continue to grow and be tender. A yellow discharge may leak from your breasts called colostrum. °· Your belly button may stick out. °· You may feel short of breath because of your expanding uterus. °· You may notice the fetus "dropping," or moving lower in your abdomen. °· You may have a bloody mucus discharge. This usually occurs a few days to a week before labor begins. °· Your cervix becomes thin and soft (effaced) near your due date. °WHAT TO EXPECT AT YOUR PRENATAL EXAMS  °You will have prenatal exams every 2 weeks until week 36. Then, you will have weekly prenatal exams. During a routine prenatal visit: °· You will be weighed to make sure you and the fetus are growing normally. °· Your blood pressure is taken. °· Your abdomen will be measured to track your baby's growth. °· The fetal heartbeat will be listened to. °· Any test results from the previous visit will be discussed. °· You may have a cervical check near your due date to see if you have effaced. °At around 36 weeks, your caregiver will check your cervix. At the same time, your caregiver will also perform a test on the secretions of the vaginal tissue. This test is to determine if a type of bacteria, Group B streptococcus, is present. Your caregiver will explain this further. °Your caregiver may ask you: °· What your birth plan is. °· How you are feeling. °· If you are feeling the baby move. °· If you have had any abnormal  symptoms, such as leaking fluid, bleeding, severe headaches, or abdominal cramping. °· If you have any questions. °Other tests or screenings that may be performed during your third trimester include: °· Blood tests that check for low iron levels (anemia). °· Fetal testing to check the health, activity level, and growth of the fetus. Testing is done if you have certain medical conditions or if there are problems during the pregnancy. °FALSE LABOR °You may feel small, irregular contractions that eventually go away. These are called Braxton Hicks contractions, or false labor. Contractions may last for hours, days, or even weeks before true labor sets in. If contractions come at regular intervals, intensify, or become painful, it is best to be seen by your caregiver.  °SIGNS OF LABOR  °· Menstrual-like cramps. °· Contractions that are 5 minutes apart or less. °· Contractions that start on the top of the uterus and spread down to the lower abdomen and back. °· A sense of increased pelvic pressure or back pain. °· A watery   or bloody mucus discharge that comes from the vagina. °If you have any of these signs before the 37th week of pregnancy, call your caregiver right away. You need to go to the hospital to get checked immediately. °HOME CARE INSTRUCTIONS  °· Avoid all smoking, herbs, alcohol, and unprescribed drugs. These chemicals affect the formation and growth of the baby. °· Follow your caregiver's instructions regarding medicine use. There are medicines that are either safe or unsafe to take during pregnancy. °· Exercise only as directed by your caregiver. Experiencing uterine cramps is a good sign to stop exercising. °· Continue to eat regular, healthy meals. °· Wear a good support bra for breast tenderness. °· Do not use hot tubs, steam rooms, or saunas. °· Wear your seat belt at all times when driving. °· Avoid raw meat, uncooked cheese, cat litter boxes, and soil used by cats. These carry germs that can cause  birth defects in the baby. °· Take your prenatal vitamins. °· Try taking a stool softener (if your caregiver approves) if you develop constipation. Eat more high-fiber foods, such as fresh vegetables or fruit and whole grains. Drink plenty of fluids to keep your urine clear or pale yellow. °· Take warm sitz baths to soothe any pain or discomfort caused by hemorrhoids. Use hemorrhoid cream if your caregiver approves. °· If you develop varicose veins, wear support hose. Elevate your feet for 15 minutes, 3 4 times a day. Limit salt in your diet. °· Avoid heavy lifting, wear low heal shoes, and practice good posture. °· Rest a lot with your legs elevated if you have leg cramps or low back pain. °· Visit your dentist if you have not gone during your pregnancy. Use a soft toothbrush to brush your teeth and be gentle when you floss. °· A sexual relationship may be continued unless your caregiver directs you otherwise. °· Do not travel far distances unless it is absolutely necessary and only with the approval of your caregiver. °· Take prenatal classes to understand, practice, and ask questions about the labor and delivery. °· Make a trial run to the hospital. °· Pack your hospital bag. °· Prepare the baby's nursery. °· Continue to go to all your prenatal visits as directed by your caregiver. °SEEK MEDICAL CARE IF: °· You are unsure if you are in labor or if your water has broken. °· You have dizziness. °· You have mild pelvic cramps, pelvic pressure, or nagging pain in your abdominal area. °· You have persistent nausea, vomiting, or diarrhea. °· You have a bad smelling vaginal discharge. °· You have pain with urination. °SEEK IMMEDIATE MEDICAL CARE IF:  °· You have a fever. °· You are leaking fluid from your vagina. °· You have spotting or bleeding from your vagina. °· You have severe abdominal cramping or pain. °· You have rapid weight loss or gain. °· You have shortness of breath with chest pain. °· You notice sudden or  extreme swelling of your face, hands, ankles, feet, or legs. °· You have not felt your baby move in over an hour. °· You have severe headaches that do not go away with medicine. °· You have vision changes. °Document Released: 08/17/2001 Document Revised: 04/25/2013 Document Reviewed: 10/24/2012 °ExitCare® Patient Information ©2014 [employer]. ° °

## 2014-01-10 ENCOUNTER — Other Ambulatory Visit (HOSPITAL_COMMUNITY): Payer: Self-pay | Admitting: Obstetrics & Gynecology

## 2014-01-10 DIAGNOSIS — O99210 Obesity complicating pregnancy, unspecified trimester: Principal | ICD-10-CM

## 2014-01-10 DIAGNOSIS — O9989 Other specified diseases and conditions complicating pregnancy, childbirth and the puerperium: Secondary | ICD-10-CM

## 2014-01-10 DIAGNOSIS — Z8751 Personal history of pre-term labor: Secondary | ICD-10-CM

## 2014-01-10 DIAGNOSIS — E669 Obesity, unspecified: Secondary | ICD-10-CM

## 2014-01-10 DIAGNOSIS — O09529 Supervision of elderly multigravida, unspecified trimester: Secondary | ICD-10-CM

## 2014-01-15 ENCOUNTER — Ambulatory Visit (HOSPITAL_COMMUNITY)
Admission: RE | Admit: 2014-01-15 | Discharge: 2014-01-15 | Disposition: A | Discharge status: Home / Self Care | Payer: 59 | Source: Ambulatory Visit | Attending: Obstetrics & Gynecology | Admitting: Obstetrics & Gynecology

## 2014-01-15 ENCOUNTER — Encounter (HOSPITAL_COMMUNITY): Payer: Self-pay

## 2014-01-15 ENCOUNTER — Other Ambulatory Visit (HOSPITAL_COMMUNITY): Payer: Self-pay | Admitting: Obstetrics & Gynecology

## 2014-01-15 ENCOUNTER — Encounter (HOSPITAL_COMMUNITY): Payer: 59

## 2014-01-15 DIAGNOSIS — Z8751 Personal history of pre-term labor: Secondary | ICD-10-CM | POA: Insufficient documentation

## 2014-01-15 DIAGNOSIS — E669 Obesity, unspecified: Secondary | ICD-10-CM

## 2014-01-15 DIAGNOSIS — O9989 Other specified diseases and conditions complicating pregnancy, childbirth and the puerperium: Secondary | ICD-10-CM

## 2014-01-15 DIAGNOSIS — Z3689 Encounter for other specified antenatal screening: Secondary | ICD-10-CM | POA: Insufficient documentation

## 2014-01-15 DIAGNOSIS — O09529 Supervision of elderly multigravida, unspecified trimester: Secondary | ICD-10-CM

## 2014-01-15 DIAGNOSIS — O99210 Obesity complicating pregnancy, unspecified trimester: Secondary | ICD-10-CM

## 2014-02-05 ENCOUNTER — Other Ambulatory Visit (HOSPITAL_COMMUNITY): Payer: 59

## 2014-02-05 ENCOUNTER — Encounter (HOSPITAL_COMMUNITY): Payer: Self-pay

## 2014-02-05 ENCOUNTER — Ambulatory Visit (HOSPITAL_COMMUNITY)
Admission: RE | Admit: 2014-02-05 | Discharge: 2014-02-05 | Disposition: A | Discharge status: Home / Self Care | Payer: 59 | Source: Ambulatory Visit | Attending: Obstetrics & Gynecology | Admitting: Obstetrics & Gynecology

## 2014-02-05 DIAGNOSIS — Z3689 Encounter for other specified antenatal screening: Secondary | ICD-10-CM | POA: Insufficient documentation

## 2014-02-05 DIAGNOSIS — O09529 Supervision of elderly multigravida, unspecified trimester: Secondary | ICD-10-CM | POA: Insufficient documentation

## 2014-02-08 ENCOUNTER — Other Ambulatory Visit (HOSPITAL_COMMUNITY): Payer: 59

## 2014-02-12 ENCOUNTER — Encounter (HOSPITAL_COMMUNITY): Payer: Self-pay

## 2014-02-12 ENCOUNTER — Other Ambulatory Visit (HOSPITAL_COMMUNITY): Payer: 59

## 2014-02-12 ENCOUNTER — Ambulatory Visit (HOSPITAL_COMMUNITY)
Admission: RE | Admit: 2014-02-12 | Discharge: 2014-02-12 | Disposition: A | Discharge status: Home / Self Care | Payer: 59 | Source: Ambulatory Visit | Attending: Obstetrics & Gynecology | Admitting: Obstetrics & Gynecology

## 2014-02-12 DIAGNOSIS — O09529 Supervision of elderly multigravida, unspecified trimester: Secondary | ICD-10-CM | POA: Insufficient documentation

## 2014-02-12 DIAGNOSIS — Z3689 Encounter for other specified antenatal screening: Secondary | ICD-10-CM | POA: Insufficient documentation

## 2014-02-15 ENCOUNTER — Other Ambulatory Visit (HOSPITAL_COMMUNITY): Payer: 59

## 2014-02-18 ENCOUNTER — Other Ambulatory Visit (HOSPITAL_COMMUNITY): Payer: Self-pay | Admitting: Obstetrics & Gynecology

## 2014-02-18 DIAGNOSIS — O99210 Obesity complicating pregnancy, unspecified trimester: Principal | ICD-10-CM

## 2014-02-18 DIAGNOSIS — Z8751 Personal history of pre-term labor: Secondary | ICD-10-CM

## 2014-02-18 DIAGNOSIS — O9989 Other specified diseases and conditions complicating pregnancy, childbirth and the puerperium: Secondary | ICD-10-CM

## 2014-02-18 DIAGNOSIS — O34599 Maternal care for other abnormalities of gravid uterus, unspecified trimester: Secondary | ICD-10-CM

## 2014-02-18 DIAGNOSIS — E669 Obesity, unspecified: Secondary | ICD-10-CM

## 2014-02-18 DIAGNOSIS — O09529 Supervision of elderly multigravida, unspecified trimester: Secondary | ICD-10-CM

## 2014-02-19 ENCOUNTER — Encounter (HOSPITAL_COMMUNITY): Payer: Self-pay

## 2014-02-19 ENCOUNTER — Other Ambulatory Visit (HOSPITAL_COMMUNITY): Payer: 59

## 2014-02-19 ENCOUNTER — Ambulatory Visit (HOSPITAL_COMMUNITY)
Admission: RE | Admit: 2014-02-19 | Discharge: 2014-02-19 | Disposition: A | Discharge status: Home / Self Care | Payer: 59 | Source: Ambulatory Visit | Attending: Obstetrics & Gynecology | Admitting: Obstetrics & Gynecology

## 2014-02-19 VITALS — BP 116/83 | HR 93 | Wt 275.8 lb

## 2014-02-19 DIAGNOSIS — O09529 Supervision of elderly multigravida, unspecified trimester: Secondary | ICD-10-CM

## 2014-02-19 DIAGNOSIS — D6859 Other primary thrombophilia: Secondary | ICD-10-CM | POA: Insufficient documentation

## 2014-02-19 DIAGNOSIS — D689 Coagulation defect, unspecified: Secondary | ICD-10-CM | POA: Insufficient documentation

## 2014-02-19 DIAGNOSIS — Z3689 Encounter for other specified antenatal screening: Secondary | ICD-10-CM | POA: Insufficient documentation

## 2014-02-19 DIAGNOSIS — O99119 Other diseases of the blood and blood-forming organs and certain disorders involving the immune mechanism complicating pregnancy, unspecified trimester: Secondary | ICD-10-CM

## 2014-02-21 ENCOUNTER — Encounter (HOSPITAL_COMMUNITY): Payer: Self-pay | Admitting: *Deleted

## 2014-02-21 ENCOUNTER — Inpatient Hospital Stay (HOSPITAL_COMMUNITY)
Admission: AD | Admit: 2014-02-21 | Discharge: 2014-02-21 | Disposition: A | Discharge status: Home / Self Care | Payer: 59 | Source: Ambulatory Visit | Attending: Obstetrics and Gynecology | Admitting: Obstetrics and Gynecology

## 2014-02-21 ENCOUNTER — Inpatient Hospital Stay (HOSPITAL_COMMUNITY): Payer: 59

## 2014-02-21 DIAGNOSIS — O4700 False labor before 37 completed weeks of gestation, unspecified trimester: Secondary | ICD-10-CM | POA: Diagnosis not present

## 2014-02-21 DIAGNOSIS — O4703 False labor before 37 completed weeks of gestation, third trimester: Secondary | ICD-10-CM

## 2014-02-21 LAB — URINALYSIS, ROUTINE W REFLEX MICROSCOPIC
Bilirubin Urine: NEGATIVE
Glucose, UA: NEGATIVE mg/dL
Ketones, ur: NEGATIVE mg/dL
LEUKOCYTES UA: NEGATIVE
Nitrite: NEGATIVE
PROTEIN: NEGATIVE mg/dL
SPECIFIC GRAVITY, URINE: 1.01 (ref 1.005–1.030)
Urobilinogen, UA: 0.2 mg/dL (ref 0.0–1.0)
pH: 5.5 (ref 5.0–8.0)

## 2014-02-21 LAB — URINE MICROSCOPIC-ADD ON

## 2014-02-21 LAB — WET PREP, GENITAL
Clue Cells Wet Prep HPF POC: NONE SEEN
Trich, Wet Prep: NONE SEEN
Yeast Wet Prep HPF POC: NONE SEEN

## 2014-02-21 MED ORDER — NIFEDIPINE 10 MG PO CAPS
20.0000 mg | ORAL_CAPSULE | Freq: Once | ORAL | Status: AC
  Administered 2014-02-21: 20 mg via ORAL
  Filled 2014-02-21: qty 2

## 2014-02-21 MED ORDER — NIFEDIPINE 20 MG PO CAPS: 20.0000 mg | ORAL_CAPSULE | Freq: Four times a day (QID) | ORAL | Status: DC | PRN

## 2014-02-21 NOTE — Discharge Instructions (Signed)

## 2014-02-21 NOTE — MAU Provider Note (Signed)
Chief Complaint:  Contractions   First Provider Initiated Contact with Patient 02/21/14 1655      HPI: Sheryl [name] is a 43 y.o. [id number] at 2091w1d who presents to maternity admissions reporting contractions that are intermittent and irregular all day long today.  She reports they have been as close together as 1 minute and as far apart as 15 minutes but have persisted, despite drinking water and resting.  She also reports a small gush of fluid, enough to soak her underwear and require a panty liner just before leaving her house to come to MAU.  She has hx of preterm birth with IOL x2 for vaginal bleeding and oligohydramnios.  She reports good fetal movement, denies vaginal bleeding, vaginal itching/burning, urinary symptoms, h/a, dizziness, n/v, or fever/chills.     Past Medical History: Past Medical History  Diagnosis Date  . Migraines     Past obstetric history: OB History  Gravida Para Term Preterm AB SAB TAB Ectopic Multiple Living  5 2  2 2 2    2     # Outcome Date GA Lbr Len/2nd Weight Sex Delivery Anes PTL Lv  5 CUR           4 PRE           3 PRE           2 SAB           1 SAB               Past Surgical History: Past Surgical History  Procedure Laterality Date  . Breast surgery      cyst removed from left breast; benign  . Dilation and curettage of uterus      x2    Family History: History reviewed. No pertinent family history.  Social History: History  Substance Use Topics  . Smoking status: Never Smoker   . Smokeless tobacco: Never Used  . Alcohol Use: No    Allergies:  Allergies  Allergen Reactions  . Salicylates Anaphylaxis, Hives and Shortness Of Breath    Meds:  Prescriptions prior to admission  Medication Sig Dispense Refill  . acetaminophen (TYLENOL) 500 MG tablet Take 500-1,000 mg by mouth every 4 (four) hours as needed for headache. Depends on pain if takes 1 or 2 tablets.      Marland Kitchen. aspirin 81 MG chewable tablet Chew 81 mg by mouth daily.       . calcium carbonate (TUMS - DOSED IN MG ELEMENTAL CALCIUM) 500 MG chewable tablet Chew 1 tablet by mouth 3 (three) times daily as needed for indigestion or heartburn.      . Prenatal Vit-Fe Fumarate-FA (PRENATAL MULTIVITAMIN) TABS tablet Take 1 tablet by mouth daily at 12 noon.      . polyethylene glycol (MIRALAX / GLYCOLAX) packet Take 17 g by mouth daily as needed for mild constipation.        ROS: Pertinent findings in history of present illness.  Physical Exam  Blood pressure 105/70, pulse 98, temperature 98.5 F (36.9 C), temperature source Oral, resp. rate 18, height 5\' 6"  (1.676 m), weight 125.102 kg (275 lb 12.8 oz), last menstrual period 06/27/2013. GENERAL: Well-developed, well-nourished female in no acute distress.  HEENT: normocephalic HEART: normal rate RESP: normal effort ABDOMEN: Soft, non-tender, gravid appropriate for gestational age EXTREMITIES: Nontender, no edema NEURO: alert and oriented Pelvic exam: Cervix pink, visually closed, without lesion, moderate amount white creamy discharge, vaginal walls and external genitalia normal  Dilation: 1  Effacement (%): Thick Cervical Position: Posterior Exam by:: L. Leftwich-Kirby CNM  FHT:  Baseline 135 , moderate variability, accelerations present, no decelerations Contractions: q 3-6 mins, irregular   Labs: Results for orders placed during the hospital encounter of 02/21/14 (from the past 24 hour(s))  URINALYSIS, ROUTINE W REFLEX MICROSCOPIC     Status: Abnormal   Collection Time    02/21/14  4:20 PM      Result Value Ref Range   Color, Urine STRAW (*) YELLOW   APPearance CLEAR  CLEAR   Specific Gravity, Urine 1.010  1.005 - 1.030   pH 5.5  5.0 - 8.0   Glucose, UA NEGATIVE  NEGATIVE mg/dL   Hgb urine dipstick TRACE (*) NEGATIVE   Bilirubin Urine NEGATIVE  NEGATIVE   Ketones, ur NEGATIVE  NEGATIVE mg/dL   Protein, ur NEGATIVE  NEGATIVE mg/dL   Urobilinogen, UA 0.2  0.0 - 1.0 mg/dL   Nitrite NEGATIVE  NEGATIVE    Leukocytes, UA NEGATIVE  NEGATIVE  URINE MICROSCOPIC-ADD ON     Status: Abnormal   Collection Time    02/21/14  4:20 PM      Result Value Ref Range   Squamous Epithelial / LPF MANY (*) RARE   WBC, UA 0-2  <3 WBC/hpf   RBC / HPF 0-2  <3 RBC/hpf   Bacteria, UA FEW (*) RARE  WET PREP, GENITAL     Status: Abnormal   Collection Time    02/21/14  5:10 PM      Result Value Ref Range   Yeast Wet Prep HPF POC NONE SEEN  NONE SEEN   Trich, Wet Prep NONE SEEN  NONE SEEN   Clue Cells Wet Prep HPF POC NONE SEEN  NONE SEEN   WBC, Wet Prep HPF POC MODERATE (*) NONE SEEN     Assessment: 1. Preterm uterine contractions, third trimester     Plan: Consult Dr Dareen Piano Discharge home PTL precautions and fetal kick counts  Addendum:  Upon returning to pt room, she reports stronger contractions and toco indicates ctx every 3-4 minutes x30 minutes.  Plan to recheck pt in 1 hour. Cervix unchanged at recheck but pt having variables on tracing.  BPP ordered  BPP 8/8.  Cervix rechecked at pt request--unchanged.   Consult Dr Dareen Piano Procardia 20 mg PO x1 dose in MAU and Rx sent to pharmacy Pt to f/u in office tomorrow      Follow-up Information   Follow [facility] with [facility] (As scheduled)    Contact information:   [address] [place] [address] [phone]      Follow up with [facility] [place] (As needed for emergencies)    Contact information:   [id number] [place] [address] [phone]       Medication List         acetaminophen 500 MG tablet  Commonly known as:  TYLENOL  Take 500-1,000 mg by mouth every 4 (four) hours as needed for headache. Depends on pain if takes 1 or 2 tablets.     aspirin 81 MG chewable tablet  Chew 81 mg by mouth daily.     calcium carbonate 500 MG chewable tablet  Commonly known as:  TUMS - dosed in mg elemental calcium   Chew 1 tablet by mouth 3 (three) times daily as needed for indigestion or heartburn.     polyethylene glycol packet  Commonly known as:  MIRALAX / GLYCOLAX  Take 17 g by mouth daily as needed for mild constipation.     prenatal multivitamin Tabs tablet  Take 1 tablet by mouth daily at 12 noon.        Sharen CounterLisa Leftwich-Kirby Certified Nurse-Midwife 02/21/2014 5:46 PM

## 2014-02-21 NOTE — MAU Note (Signed)
Pt reports she has been having ctx on and off all afternoon about 4-7 min appart. Stated she felt some fluid lek out about an hour ago none since. Reports fetal movement less than usual..

## 2014-02-22 ENCOUNTER — Other Ambulatory Visit (HOSPITAL_COMMUNITY): Payer: 59

## 2014-02-26 ENCOUNTER — Other Ambulatory Visit (HOSPITAL_COMMUNITY): Payer: Self-pay | Admitting: Maternal and Fetal Medicine

## 2014-02-26 ENCOUNTER — Ambulatory Visit (HOSPITAL_COMMUNITY)
Admission: RE | Admit: 2014-02-26 | Discharge: 2014-02-26 | Disposition: A | Discharge status: Home / Self Care | Payer: 59 | Source: Ambulatory Visit | Attending: Maternal and Fetal Medicine | Admitting: Maternal and Fetal Medicine

## 2014-02-26 DIAGNOSIS — O09529 Supervision of elderly multigravida, unspecified trimester: Secondary | ICD-10-CM

## 2014-02-26 DIAGNOSIS — Z3689 Encounter for other specified antenatal screening: Secondary | ICD-10-CM | POA: Insufficient documentation

## 2014-03-01 ENCOUNTER — Other Ambulatory Visit (HOSPITAL_COMMUNITY): Payer: Self-pay | Admitting: Obstetrics & Gynecology

## 2014-03-01 DIAGNOSIS — O99210 Obesity complicating pregnancy, unspecified trimester: Principal | ICD-10-CM

## 2014-03-01 DIAGNOSIS — O9989 Other specified diseases and conditions complicating pregnancy, childbirth and the puerperium: Secondary | ICD-10-CM

## 2014-03-01 DIAGNOSIS — O09529 Supervision of elderly multigravida, unspecified trimester: Secondary | ICD-10-CM

## 2014-03-01 DIAGNOSIS — Z8751 Personal history of pre-term labor: Secondary | ICD-10-CM

## 2014-03-01 DIAGNOSIS — E669 Obesity, unspecified: Secondary | ICD-10-CM

## 2014-03-01 DIAGNOSIS — O34599 Maternal care for other abnormalities of gravid uterus, unspecified trimester: Secondary | ICD-10-CM

## 2014-03-01 LAB — OB RESULTS CONSOLE GBS: GBS: NEGATIVE

## 2014-03-05 ENCOUNTER — Ambulatory Visit (HOSPITAL_COMMUNITY)
Admission: RE | Admit: 2014-03-05 | Discharge: 2014-03-05 | Disposition: A | Discharge status: Home / Self Care | Payer: 59 | Source: Ambulatory Visit | Attending: Obstetrics & Gynecology | Admitting: Obstetrics & Gynecology

## 2014-03-05 ENCOUNTER — Other Ambulatory Visit (HOSPITAL_COMMUNITY): Payer: Self-pay | Admitting: Maternal and Fetal Medicine

## 2014-03-05 ENCOUNTER — Encounter (HOSPITAL_COMMUNITY): Payer: Self-pay

## 2014-03-05 DIAGNOSIS — N83209 Unspecified ovarian cyst, unspecified side: Secondary | ICD-10-CM | POA: Insufficient documentation

## 2014-03-05 DIAGNOSIS — O09529 Supervision of elderly multigravida, unspecified trimester: Secondary | ICD-10-CM

## 2014-03-05 DIAGNOSIS — D689 Coagulation defect, unspecified: Secondary | ICD-10-CM | POA: Insufficient documentation

## 2014-03-05 DIAGNOSIS — D6859 Other primary thrombophilia: Secondary | ICD-10-CM | POA: Insufficient documentation

## 2014-03-05 DIAGNOSIS — O99119 Other diseases of the blood and blood-forming organs and certain disorders involving the immune mechanism complicating pregnancy, unspecified trimester: Principal | ICD-10-CM

## 2014-03-05 DIAGNOSIS — O34599 Maternal care for other abnormalities of gravid uterus, unspecified trimester: Secondary | ICD-10-CM | POA: Insufficient documentation

## 2014-03-05 NOTE — Progress Notes (Signed)
[facility]  Indication: 43 yr old [id number] at 7218w6d with antiphospholipid antibody syndrome for fetal growth and BPP.  Findings: 1. Single intrauterine pregnancy. 2. Estimated fetal weight is in the 75th%. 3. Posterior placenta without evidence of previa. 4. Normal amniotic fluid index. 5. The limited anatomy survey is normal. 6. Normal biophysical profile of 8/8. 7. Again seen is a right adnexal cyst measuring 7cm.  Recommendations: 1. Appropriate fetal growth. 2. Normal AFI and BPP. 3. Antiphospholipid antibody syndrome: - on low dose aspirin - recommend continue antenatal testing - please call if would like delivery recommendations 4. Advanced maternal age: - recommend fetal surveillance as above - declined aneuploidy screening 5. Ovarian cyst: - previously counseled - recommend evaluation postpartum or if needs C section at time of C section 6. Recommend fetal kick counts  Sheryl FosterKristen Marvell Tamer, MD

## 2014-03-12 ENCOUNTER — Other Ambulatory Visit (HOSPITAL_COMMUNITY): Payer: Self-pay | Admitting: Maternal and Fetal Medicine

## 2014-03-12 ENCOUNTER — Encounter (HOSPITAL_COMMUNITY): Payer: Self-pay

## 2014-03-12 ENCOUNTER — Ambulatory Visit (HOSPITAL_COMMUNITY)
Admission: RE | Admit: 2014-03-12 | Discharge: 2014-03-12 | Disposition: A | Discharge status: Home / Self Care | Payer: 59 | Source: Ambulatory Visit | Attending: Maternal and Fetal Medicine | Admitting: Maternal and Fetal Medicine

## 2014-03-12 DIAGNOSIS — O09529 Supervision of elderly multigravida, unspecified trimester: Secondary | ICD-10-CM

## 2014-03-12 DIAGNOSIS — Z3689 Encounter for other specified antenatal screening: Secondary | ICD-10-CM | POA: Insufficient documentation

## 2014-03-12 DIAGNOSIS — O368131 Decreased fetal movements, third trimester, fetus 1: Secondary | ICD-10-CM

## 2014-03-12 DIAGNOSIS — R894 Abnormal immunological findings in specimens from other organs, systems and tissues: Secondary | ICD-10-CM | POA: Insufficient documentation

## 2014-03-19 ENCOUNTER — Inpatient Hospital Stay (HOSPITAL_COMMUNITY)
Admission: AD | Admit: 2014-03-19 | Discharge: 2014-03-22 | DRG: 765 | Disposition: A | Discharge status: Home / Self Care | Payer: 59 | Source: Ambulatory Visit | Attending: Obstetrics and Gynecology | Admitting: Obstetrics and Gynecology

## 2014-03-19 ENCOUNTER — Encounter (HOSPITAL_COMMUNITY): Payer: Self-pay | Admitting: *Deleted

## 2014-03-19 ENCOUNTER — Ambulatory Visit (HOSPITAL_COMMUNITY)
Admission: RE | Admit: 2014-03-19 | Discharge: 2014-03-19 | Disposition: A | Discharge status: Home / Self Care | Payer: 59 | Source: Ambulatory Visit | Attending: Obstetrics and Gynecology | Admitting: Obstetrics and Gynecology

## 2014-03-19 ENCOUNTER — Encounter (HOSPITAL_COMMUNITY): Payer: Self-pay

## 2014-03-19 ENCOUNTER — Other Ambulatory Visit (HOSPITAL_COMMUNITY): Payer: Self-pay | Admitting: Obstetrics & Gynecology

## 2014-03-19 ENCOUNTER — Ambulatory Visit (HOSPITAL_COMMUNITY)
Admission: RE | Admit: 2014-03-19 | Discharge: 2014-03-19 | Disposition: A | Discharge status: Home / Self Care | Payer: 59 | Source: Ambulatory Visit | Attending: Maternal and Fetal Medicine | Admitting: Maternal and Fetal Medicine

## 2014-03-19 DIAGNOSIS — O99210 Obesity complicating pregnancy, unspecified trimester: Principal | ICD-10-CM

## 2014-03-19 DIAGNOSIS — Z9889 Other specified postprocedural states: Secondary | ICD-10-CM

## 2014-03-19 DIAGNOSIS — O09529 Supervision of elderly multigravida, unspecified trimester: Secondary | ICD-10-CM | POA: Insufficient documentation

## 2014-03-19 DIAGNOSIS — N838 Other noninflammatory disorders of ovary, fallopian tube and broad ligament: Secondary | ICD-10-CM | POA: Diagnosis present

## 2014-03-19 DIAGNOSIS — E669 Obesity, unspecified: Secondary | ICD-10-CM | POA: Diagnosis present

## 2014-03-19 DIAGNOSIS — O9912 Other diseases of the blood and blood-forming organs and certain disorders involving the immune mechanism complicating childbirth: Secondary | ICD-10-CM

## 2014-03-19 DIAGNOSIS — O34599 Maternal care for other abnormalities of gravid uterus, unspecified trimester: Secondary | ICD-10-CM

## 2014-03-19 DIAGNOSIS — D689 Coagulation defect, unspecified: Secondary | ICD-10-CM | POA: Diagnosis present

## 2014-03-19 DIAGNOSIS — Z8751 Personal history of pre-term labor: Secondary | ICD-10-CM

## 2014-03-19 DIAGNOSIS — O4590 Premature separation of placenta, unspecified, unspecified trimester: Principal | ICD-10-CM | POA: Diagnosis present

## 2014-03-19 DIAGNOSIS — O99214 Obesity complicating childbirth: Secondary | ICD-10-CM

## 2014-03-19 DIAGNOSIS — O411090 Infection of amniotic sac and membranes, unspecified, unspecified trimester, not applicable or unspecified: Secondary | ICD-10-CM | POA: Diagnosis present

## 2014-03-19 DIAGNOSIS — K219 Gastro-esophageal reflux disease without esophagitis: Secondary | ICD-10-CM | POA: Diagnosis present

## 2014-03-19 DIAGNOSIS — Z6841 Body Mass Index (BMI) 40.0 and over, adult: Secondary | ICD-10-CM

## 2014-03-19 DIAGNOSIS — Z302 Encounter for sterilization: Secondary | ICD-10-CM

## 2014-03-19 DIAGNOSIS — O9989 Other specified diseases and conditions complicating pregnancy, childbirth and the puerperium: Secondary | ICD-10-CM

## 2014-03-19 DIAGNOSIS — D6859 Other primary thrombophilia: Secondary | ICD-10-CM | POA: Diagnosis present

## 2014-03-19 LAB — TYPE AND SCREEN
ABO/RH(D): A POS
Antibody Screen: NEGATIVE

## 2014-03-19 LAB — CBC
HEMATOCRIT: 33.4 % — AB (ref 36.0–46.0)
HEMOGLOBIN: 11 g/dL — AB (ref 12.0–15.0)
MCH: 27.3 pg (ref 26.0–34.0)
MCHC: 32.9 g/dL (ref 30.0–36.0)
MCV: 82.9 fL (ref 78.0–100.0)
Platelets: 364 10*3/uL (ref 150–400)
RBC: 4.03 MIL/uL (ref 3.87–5.11)
RDW: 15.4 % (ref 11.5–15.5)
WBC: 16.7 10*3/uL — ABNORMAL HIGH (ref 4.0–10.5)

## 2014-03-19 LAB — RPR

## 2014-03-19 MED ORDER — ACETAMINOPHEN 325 MG PO TABS: 650.0000 mg | ORAL_TABLET | ORAL | Status: DC | PRN

## 2014-03-19 MED ORDER — IBUPROFEN 600 MG PO TABS: 600.0000 mg | ORAL_TABLET | Freq: Four times a day (QID) | ORAL | Status: DC | PRN

## 2014-03-19 MED ORDER — LIDOCAINE HCL (PF) 1 % IJ SOLN: 30.0000 mL | INTRAMUSCULAR | Status: DC | PRN

## 2014-03-19 MED ORDER — FLEET ENEMA 7-19 GM/118ML RE ENEM: 1.0000 | ENEMA | RECTAL | Status: DC | PRN

## 2014-03-19 MED ORDER — OXYTOCIN BOLUS FROM INFUSION: 500.0000 mL | INTRAVENOUS | Status: DC

## 2014-03-19 MED ORDER — LACTATED RINGERS IV SOLN
INTRAVENOUS | Status: DC
  Administered 2014-03-19: 18:00:00 via INTRAVENOUS

## 2014-03-19 MED ORDER — ONDANSETRON HCL 4 MG/2ML IJ SOLN: 4.0000 mg | Freq: Four times a day (QID) | INTRAMUSCULAR | Status: DC | PRN

## 2014-03-19 MED ORDER — CITRIC ACID-SODIUM CITRATE 334-500 MG/5ML PO SOLN
30.0000 mL | ORAL | Status: DC | PRN
  Administered 2014-03-20: 30 mL via ORAL
  Filled 2014-03-19: qty 15

## 2014-03-19 MED ORDER — LACTATED RINGERS IV SOLN: 500.0000 mL | INTRAVENOUS | Status: DC | PRN

## 2014-03-19 MED ORDER — BUTORPHANOL TARTRATE 1 MG/ML IJ SOLN: 1.0000 mg | INTRAMUSCULAR | Status: DC | PRN

## 2014-03-19 MED ORDER — OXYTOCIN 40 UNITS IN LACTATED RINGERS INFUSION - SIMPLE MED
62.5000 mL/h | INTRAVENOUS | Status: DC
  Filled 2014-03-19: qty 1000

## 2014-03-19 MED ORDER — OXYCODONE-ACETAMINOPHEN 5-325 MG PO TABS: 1.0000 | ORAL_TABLET | ORAL | Status: DC | PRN

## 2014-03-19 MED ORDER — ZOLPIDEM TARTRATE 5 MG PO TABS
5.0000 mg | ORAL_TABLET | Freq: Every evening | ORAL | Status: DC | PRN
  Administered 2014-03-20: 5 mg via ORAL
  Filled 2014-03-19: qty 1

## 2014-03-19 NOTE — H&P (Signed)
43 y.o. 229w6d  [name] comes in for induction at term.  Although BPP by MFM was normal today, she reports that there has been a change in fetal movement and that it has taken longer and longer at office to get a reactive NST.  She is receiving antenatal testing for Antiphospholipid AB sydrome and is on low dose ASA.  The MFM note indicates that we should call for delivery recs but Dr.Quinn called me herself to say that she was worried about the pt and that delivery now would not be wrong.  Past Medical History  Diagnosis Date  . Migraines     Past Surgical History  Procedure Laterality Date  . Breast surgery      cyst removed from left breast; benign  . Dilation and curettage of uterus      x2    OB History  Gravida Para Term Preterm AB SAB TAB Ectopic Multiple Living  5 2  2 2 2    2     # Outcome Date GA Lbr Len/2nd Weight Sex Delivery Anes PTL Lv  5 CUR           4 PRE           3 PRE           2 SAB           1 SAB               History   Social History  . Marital Status: Married    Spouse Name: N/A    Number of Children: N/A  . Years of Education: N/A   Occupational History  . Not on file.   Social History Main Topics  . Smoking status: Never Smoker   . Smokeless tobacco: Never Used  . Alcohol Use: No  . Drug Use: No  . Sexual Activity: Yes    Birth Control/ Protection: None     Comment: two days ago   Other Topics Concern  . Not on file   Social History Narrative  . No narrative on file   Salicylates    Prenatal Transfer Tool  Maternal Diabetes: No Genetic Screening: Declined Maternal Ultrasounds/Referrals: Normal Fetal Ultrasounds or other Referrals:  Referred to Materal Fetal Medicine  Maternal Substance Abuse:  No Significant Maternal Medications:  Meds include: Other: ASA Significant Maternal Lab Results: Lab values include: Other: Antiphospholipid AB  Other PNC: uncomplicated.    Filed Vitals:   03/19/14 1815  BP:   Pulse: 92  Temp:    Resp: 99     Lungs/Cor:  NAD Abdomen:  soft, gravid Ex:  no cords, erythema SVE:  2/50/-3 per nurse FHTs: 130s , good STV, NST R Toco:  q occ   A/P   42 y.o.[name] 2729w6d with APAS and despite reassuring testing, testing takes longer and longer to complete.  Pt has hx of 2 preterm deliveries for presumed abruption.  MFM called me to say that they were worried- will admit for obs o/n and begin induction in am.  GBS neg.  SCDs.  Sheryl [name] A

## 2014-03-20 ENCOUNTER — Encounter (HOSPITAL_COMMUNITY): Payer: Self-pay | Admitting: *Deleted

## 2014-03-20 ENCOUNTER — Inpatient Hospital Stay (HOSPITAL_COMMUNITY): Payer: 59 | Admitting: Anesthesiology

## 2014-03-20 ENCOUNTER — Encounter (HOSPITAL_COMMUNITY): Payer: 59 | Admitting: Anesthesiology

## 2014-03-20 ENCOUNTER — Encounter (HOSPITAL_COMMUNITY)
Admission: AD | Disposition: A | Discharge status: Home / Self Care | Payer: Self-pay | Source: Ambulatory Visit | Attending: Obstetrics and Gynecology

## 2014-03-20 DIAGNOSIS — Z9889 Other specified postprocedural states: Secondary | ICD-10-CM

## 2014-03-20 HISTORY — PX: UNILATERAL SALPINGECTOMY: SHX6160

## 2014-03-20 HISTORY — DX: Other specified postprocedural states: Z98.890

## 2014-03-20 HISTORY — PX: TUBAL LIGATION: SHX77

## 2014-03-20 LAB — CBC
HCT: 27.9 % — ABNORMAL LOW (ref 36.0–46.0)
Hemoglobin: 8.9 g/dL — ABNORMAL LOW (ref 12.0–15.0)
MCH: 26.5 pg (ref 26.0–34.0)
MCHC: 31.9 g/dL (ref 30.0–36.0)
MCV: 83 fL (ref 78.0–100.0)
PLATELETS: 308 10*3/uL (ref 150–400)
RBC: 3.36 MIL/uL — ABNORMAL LOW (ref 3.87–5.11)
RDW: 15.4 % (ref 11.5–15.5)
WBC: 20.2 10*3/uL — ABNORMAL HIGH (ref 4.0–10.5)

## 2014-03-20 LAB — CREATININE, SERUM
CREATININE: 0.51 mg/dL (ref 0.50–1.10)
GFR calc Af Amer: >90 mL/min (ref >90)

## 2014-03-20 SURGERY — Surgical Case
Anesthesia: Spinal | Site: Abdomen | Laterality: Right

## 2014-03-20 MED ORDER — PSEUDOEPHEDRINE-GUAIFENESIN ER 60-600 MG PO TB12: 2.0000 | ORAL_TABLET | Freq: Two times a day (BID) | ORAL | Status: DC

## 2014-03-20 MED ORDER — DIPHENHYDRAMINE HCL 50 MG/ML IJ SOLN: 12.5000 mg | INTRAMUSCULAR | Status: DC | PRN

## 2014-03-20 MED ORDER — 0.9 % SODIUM CHLORIDE (POUR BTL) OPTIME
TOPICAL | Status: DC | PRN
  Administered 2014-03-20: 1000 mL

## 2014-03-20 MED ORDER — LACTATED RINGERS IV SOLN
INTRAVENOUS | Status: DC | PRN
  Administered 2014-03-20 (×2): via INTRAVENOUS

## 2014-03-20 MED ORDER — LORATADINE 10 MG PO TABS
10.0000 mg | ORAL_TABLET | Freq: Every day | ORAL | Status: DC
  Administered 2014-03-20: 10 mg via ORAL
  Filled 2014-03-20: qty 1

## 2014-03-20 MED ORDER — ONDANSETRON HCL 4 MG/2ML IJ SOLN: 4.0000 mg | Freq: Three times a day (TID) | INTRAMUSCULAR | Status: DC | PRN

## 2014-03-20 MED ORDER — ACETAMINOPHEN 160 MG/5ML PO SOLN: 975.0000 mg | Freq: Four times a day (QID) | ORAL | Status: DC | PRN

## 2014-03-20 MED ORDER — ACETAMINOPHEN 160 MG/5ML PO SOLN
975.0000 mg | Freq: Four times a day (QID) | ORAL | Status: DC | PRN
  Administered 2014-03-20 – 2014-03-22 (×3): 975 mg via ORAL
  Filled 2014-03-20 (×3): qty 40.6

## 2014-03-20 MED ORDER — GUAIFENESIN ER 600 MG PO TB12
1200.0000 mg | ORAL_TABLET | Freq: Two times a day (BID) | ORAL | Status: DC
Start: 2014-03-20 — End: 2014-03-22
  Administered 2014-03-20 – 2014-03-22 (×4): 1200 mg via ORAL
  Filled 2014-03-20 (×5): qty 2

## 2014-03-20 MED ORDER — ONDANSETRON HCL 4 MG/2ML IJ SOLN: 4.0000 mg | INTRAMUSCULAR | Status: DC | PRN

## 2014-03-20 MED ORDER — DIPHENHYDRAMINE HCL 25 MG PO CAPS: 25.0000 mg | ORAL_CAPSULE | Freq: Four times a day (QID) | ORAL | Status: DC | PRN

## 2014-03-20 MED ORDER — SCOPOLAMINE 1 MG/3DAYS TD PT72
MEDICATED_PATCH | TRANSDERMAL | Status: AC
  Administered 2014-03-20: 1.5 mg via TRANSDERMAL
  Filled 2014-03-20: qty 1

## 2014-03-20 MED ORDER — BISACODYL 10 MG RE SUPP: 10.0000 mg | Freq: Every day | RECTAL | Status: DC | PRN

## 2014-03-20 MED ORDER — CEFAZOLIN SODIUM-DEXTROSE 2-3 GM-% IV SOLR
INTRAVENOUS | Status: DC | PRN
  Administered 2014-03-20: 2 g via INTRAVENOUS

## 2014-03-20 MED ORDER — DIPHENHYDRAMINE HCL 50 MG/ML IJ SOLN: 25.0000 mg | INTRAMUSCULAR | Status: DC | PRN

## 2014-03-20 MED ORDER — TERBUTALINE SULFATE 1 MG/ML IJ SOLN: 0.2500 mg | Freq: Once | INTRAMUSCULAR | Status: DC | PRN

## 2014-03-20 MED ORDER — SIMETHICONE 80 MG PO CHEW
80.0000 mg | CHEWABLE_TABLET | ORAL | Status: DC
  Administered 2014-03-21 (×2): 80 mg via ORAL
  Filled 2014-03-20 (×2): qty 1

## 2014-03-20 MED ORDER — ONDANSETRON HCL 4 MG/2ML IJ SOLN
INTRAMUSCULAR | Status: AC
  Filled 2014-03-20: qty 2

## 2014-03-20 MED ORDER — TETANUS-DIPHTH-ACELL PERTUSSIS 5-2.5-18.5 LF-MCG/0.5 IM SUSP
0.5000 mL | Freq: Once | INTRAMUSCULAR | Status: AC
  Administered 2014-03-21: 0.5 mL via INTRAMUSCULAR
  Filled 2014-03-20: qty 0.5

## 2014-03-20 MED ORDER — NALOXONE HCL 0.4 MG/ML IJ SOLN: 0.4000 mg | INTRAMUSCULAR | Status: DC | PRN

## 2014-03-20 MED ORDER — FENTANYL CITRATE 0.05 MG/ML IJ SOLN
INTRAMUSCULAR | Status: AC
  Filled 2014-03-20: qty 2

## 2014-03-20 MED ORDER — ONDANSETRON HCL 4 MG/2ML IJ SOLN
INTRAMUSCULAR | Status: DC | PRN
  Administered 2014-03-20: 4 mg via INTRAVENOUS

## 2014-03-20 MED ORDER — PHENYLEPHRINE HCL 10 MG/ML IJ SOLN
INTRAMUSCULAR | Status: DC | PRN
  Administered 2014-03-20: 40 ug via INTRAVENOUS
  Administered 2014-03-20: 120 ug via INTRAVENOUS

## 2014-03-20 MED ORDER — SIMETHICONE 80 MG PO CHEW
80.0000 mg | CHEWABLE_TABLET | Freq: Three times a day (TID) | ORAL | Status: DC
  Administered 2014-03-20 – 2014-03-22 (×6): 80 mg via ORAL
  Filled 2014-03-20 (×7): qty 1

## 2014-03-20 MED ORDER — OXYTOCIN 10 UNIT/ML IJ SOLN
INTRAMUSCULAR | Status: AC
  Filled 2014-03-20: qty 4

## 2014-03-20 MED ORDER — ASPIRIN 81 MG PO CHEW
81.0000 mg | CHEWABLE_TABLET | Freq: Every day | ORAL | Status: DC
  Filled 2014-03-20: qty 1

## 2014-03-20 MED ORDER — FENTANYL CITRATE 0.05 MG/ML IJ SOLN
INTRAMUSCULAR | Status: DC | PRN
  Administered 2014-03-20: 15 ug via INTRATHECAL

## 2014-03-20 MED ORDER — METHYLERGONOVINE MALEATE 0.2 MG PO TABS: 0.2000 mg | ORAL_TABLET | ORAL | Status: DC | PRN

## 2014-03-20 MED ORDER — OXYTOCIN 40 UNITS IN LACTATED RINGERS INFUSION - SIMPLE MED
1.0000 m[IU]/min | INTRAVENOUS | Status: DC
  Administered 2014-03-20: 2 m[IU]/min via INTRAVENOUS

## 2014-03-20 MED ORDER — PHENYLEPHRINE 8 MG IN D5W 100 ML (0.08MG/ML) PREMIX OPTIME
INJECTION | INTRAVENOUS | Status: AC
  Filled 2014-03-20: qty 100

## 2014-03-20 MED ORDER — PANTOPRAZOLE SODIUM 40 MG PO TBEC
40.0000 mg | DELAYED_RELEASE_TABLET | Freq: Every day | ORAL | Status: DC
  Administered 2014-03-20 – 2014-03-21 (×2): 40 mg via ORAL
  Filled 2014-03-20 (×3): qty 1

## 2014-03-20 MED ORDER — METHYLERGONOVINE MALEATE 0.2 MG/ML IJ SOLN: 0.2000 mg | INTRAMUSCULAR | Status: DC | PRN

## 2014-03-20 MED ORDER — FLEET ENEMA 7-19 GM/118ML RE ENEM: 1.0000 | ENEMA | Freq: Every day | RECTAL | Status: DC | PRN

## 2014-03-20 MED ORDER — LACTATED RINGERS IV SOLN
INTRAVENOUS | Status: DC
  Administered 2014-03-20: 13:00:00 via INTRAVENOUS

## 2014-03-20 MED ORDER — MEASLES, MUMPS & RUBELLA VAC ~~LOC~~ INJ
0.5000 mL | INJECTION | Freq: Once | SUBCUTANEOUS | Status: DC
  Filled 2014-03-20: qty 0.5

## 2014-03-20 MED ORDER — ACETAMINOPHEN 160 MG/5ML PO SOLN: 325.0000 mg | ORAL | Status: DC | PRN

## 2014-03-20 MED ORDER — SIMETHICONE 80 MG PO CHEW: 80.0000 mg | CHEWABLE_TABLET | ORAL | Status: DC | PRN

## 2014-03-20 MED ORDER — WITCH HAZEL-GLYCERIN EX PADS: 1.0000 "application " | MEDICATED_PAD | CUTANEOUS | Status: DC | PRN

## 2014-03-20 MED ORDER — PHENYLEPHRINE 8 MG IN D5W 100 ML (0.08MG/ML) PREMIX OPTIME
INJECTION | INTRAVENOUS | Status: DC | PRN
  Administered 2014-03-20: 60 ug/min via INTRAVENOUS

## 2014-03-20 MED ORDER — ENOXAPARIN SODIUM 40 MG/0.4ML ~~LOC~~ SOLN
40.0000 mg | SUBCUTANEOUS | Status: AC
  Administered 2014-03-21 – 2014-03-22 (×2): 40 mg via SUBCUTANEOUS
  Filled 2014-03-20 (×2): qty 0.4

## 2014-03-20 MED ORDER — SODIUM CHLORIDE 0.9 % IJ SOLN: 3.0000 mL | INTRAMUSCULAR | Status: DC | PRN

## 2014-03-20 MED ORDER — MEPERIDINE HCL 25 MG/ML IJ SOLN: 6.2500 mg | INTRAMUSCULAR | Status: DC | PRN

## 2014-03-20 MED ORDER — OXYTOCIN 10 UNIT/ML IJ SOLN
40.0000 [IU] | INTRAVENOUS | Status: DC | PRN
  Administered 2014-03-20: 40 [IU] via INTRAVENOUS

## 2014-03-20 MED ORDER — METOCLOPRAMIDE HCL 5 MG/ML IJ SOLN
10.0000 mg | Freq: Three times a day (TID) | INTRAMUSCULAR | Status: DC | PRN
  Administered 2014-03-21: 10 mg via INTRAVENOUS
  Filled 2014-03-20: qty 2

## 2014-03-20 MED ORDER — FENTANYL CITRATE 0.05 MG/ML IJ SOLN
INTRAMUSCULAR | Status: AC
  Administered 2014-03-20: 50 ug via INTRAVENOUS
  Filled 2014-03-20: qty 2

## 2014-03-20 MED ORDER — PSEUDOEPHEDRINE HCL ER 120 MG PO TB12
120.0000 mg | ORAL_TABLET | Freq: Two times a day (BID) | ORAL | Status: DC
  Administered 2014-03-20 – 2014-03-22 (×4): 120 mg via ORAL
  Filled 2014-03-20 (×5): qty 1

## 2014-03-20 MED ORDER — DIPHENHYDRAMINE HCL 25 MG PO CAPS: 25.0000 mg | ORAL_CAPSULE | ORAL | Status: DC | PRN

## 2014-03-20 MED ORDER — ZOLPIDEM TARTRATE 5 MG PO TABS: 5.0000 mg | ORAL_TABLET | Freq: Every evening | ORAL | Status: DC | PRN

## 2014-03-20 MED ORDER — NALOXONE HCL 1 MG/ML IJ SOLN
1.0000 ug/kg/h | INTRAVENOUS | Status: DC | PRN
  Filled 2014-03-20: qty 2

## 2014-03-20 MED ORDER — PRENATAL MULTIVITAMIN CH
1.0000 | ORAL_TABLET | Freq: Every day | ORAL | Status: DC
  Administered 2014-03-20: 1 via ORAL
  Filled 2014-03-20 (×2): qty 1

## 2014-03-20 MED ORDER — PHENYLEPHRINE 40 MCG/ML (10ML) SYRINGE FOR IV PUSH (FOR BLOOD PRESSURE SUPPORT)
PREFILLED_SYRINGE | INTRAVENOUS | Status: AC
  Filled 2014-03-20: qty 5

## 2014-03-20 MED ORDER — FENTANYL CITRATE 0.05 MG/ML IJ SOLN
25.0000 ug | INTRAMUSCULAR | Status: DC | PRN
  Administered 2014-03-20 (×2): 50 ug via INTRAVENOUS

## 2014-03-20 MED ORDER — DIBUCAINE 1 % RE OINT: 1.0000 "application " | TOPICAL_OINTMENT | RECTAL | Status: DC | PRN

## 2014-03-20 MED ORDER — MEPERIDINE HCL 25 MG/ML IJ SOLN
INTRAMUSCULAR | Status: AC
  Filled 2014-03-20: qty 1

## 2014-03-20 MED ORDER — OMEPRAZOLE MAGNESIUM 20 MG PO TBEC: 20.0000 mg | DELAYED_RELEASE_TABLET | ORAL | Status: DC

## 2014-03-20 MED ORDER — SCOPOLAMINE 1 MG/3DAYS TD PT72
1.0000 | MEDICATED_PATCH | Freq: Once | TRANSDERMAL | Status: DC
  Administered 2014-03-20: 1.5 mg via TRANSDERMAL

## 2014-03-20 MED ORDER — ACETAMINOPHEN 325 MG PO TABS: 325.0000 mg | ORAL_TABLET | ORAL | Status: DC | PRN

## 2014-03-20 MED ORDER — SENNOSIDES-DOCUSATE SODIUM 8.6-50 MG PO TABS
2.0000 | ORAL_TABLET | ORAL | Status: DC
  Administered 2014-03-21 (×2): 2 via ORAL
  Filled 2014-03-20 (×2): qty 2

## 2014-03-20 MED ORDER — MORPHINE SULFATE 0.5 MG/ML IJ SOLN
INTRAMUSCULAR | Status: AC
  Filled 2014-03-20: qty 10

## 2014-03-20 MED ORDER — OXYCODONE-ACETAMINOPHEN 5-325 MG PO TABS
1.0000 | ORAL_TABLET | ORAL | Status: DC | PRN
  Administered 2014-03-20 – 2014-03-21 (×7): 2 via ORAL
  Filled 2014-03-20 (×8): qty 2

## 2014-03-20 MED ORDER — NALBUPHINE HCL 10 MG/ML IJ SOLN
5.0000 mg | INTRAMUSCULAR | Status: DC | PRN
Start: 2014-03-20 — End: 2014-03-22

## 2014-03-20 MED ORDER — ONDANSETRON HCL 4 MG PO TABS
4.0000 mg | ORAL_TABLET | ORAL | Status: DC | PRN
Start: 2014-03-20 — End: 2014-03-22
  Administered 2014-03-21 (×2): 4 mg via ORAL
  Filled 2014-03-20 (×2): qty 1

## 2014-03-20 MED ORDER — LANOLIN HYDROUS EX OINT: 1.0000 "application " | TOPICAL_OINTMENT | CUTANEOUS | Status: DC | PRN

## 2014-03-20 MED ORDER — MORPHINE SULFATE (PF) 0.5 MG/ML IJ SOLN
INTRAMUSCULAR | Status: DC | PRN
  Administered 2014-03-20: .1 mg via INTRATHECAL

## 2014-03-20 MED ORDER — MENTHOL 3 MG MT LOZG: 1.0000 | LOZENGE | OROMUCOSAL | Status: DC | PRN

## 2014-03-20 MED ORDER — MEPERIDINE HCL 25 MG/ML IJ SOLN
INTRAMUSCULAR | Status: DC | PRN
  Administered 2014-03-20 (×2): 12.5 mg via INTRAVENOUS

## 2014-03-20 MED ORDER — FERROUS SULFATE 325 (65 FE) MG PO TABS
325.0000 mg | ORAL_TABLET | Freq: Two times a day (BID) | ORAL | Status: DC
  Administered 2014-03-20 – 2014-03-22 (×4): 325 mg via ORAL
  Filled 2014-03-20 (×5): qty 1

## 2014-03-20 MED ORDER — METOCLOPRAMIDE HCL 5 MG/ML IJ SOLN: 10.0000 mg | Freq: Once | INTRAMUSCULAR | Status: DC | PRN

## 2014-03-20 MED ORDER — OXYTOCIN 40 UNITS IN LACTATED RINGERS INFUSION - SIMPLE MED
62.5000 mL/h | INTRAVENOUS | Status: AC
Start: 2014-03-20 — End: 2014-03-20

## 2014-03-20 MED ORDER — CEFAZOLIN SODIUM-DEXTROSE 2-3 GM-% IV SOLR
INTRAVENOUS | Status: AC
  Filled 2014-03-20: qty 50

## 2014-03-20 MED ORDER — BUPIVACAINE IN DEXTROSE 0.75-8.25 % IT SOLN
INTRATHECAL | Status: DC | PRN
  Administered 2014-03-20: 1.5 mL via INTRATHECAL

## 2014-03-20 SURGICAL SUPPLY — 36 items
APL SKNCLS STERI-STRIP NONHPOA (GAUZE/BANDAGES/DRESSINGS) ×2
BENZOIN TINCTURE PRP APPL 2/3 (GAUZE/BANDAGES/DRESSINGS) ×5 IMPLANT
CLAMP CORD UMBIL (MISCELLANEOUS) IMPLANT
CLIP FILSHIE TUBAL LIGA STRL (Clip) ×5 IMPLANT
CLOSURE WOUND 1/2 X4 (GAUZE/BANDAGES/DRESSINGS) ×1
CLOTH BEACON ORANGE TIMEOUT ST (SAFETY) ×5 IMPLANT
DRAPE LG THREE QUARTER DISP (DRAPES) IMPLANT
DRSG OPSITE POSTOP 4X10 (GAUZE/BANDAGES/DRESSINGS) ×5 IMPLANT
DURAPREP 26ML APPLICATOR (WOUND CARE) ×5 IMPLANT
ELECT REM PT RETURN 9FT ADLT (ELECTROSURGICAL) ×5
ELECTRODE REM PT RTRN 9FT ADLT (ELECTROSURGICAL) ×3 IMPLANT
EXTRACTOR VACUUM BELL STYLE (SUCTIONS) IMPLANT
GLOVE BIO SURGEON STRL SZ7 (GLOVE) ×5 IMPLANT
GOWN STRL REUS W/TWL LRG LVL3 (GOWN DISPOSABLE) ×10 IMPLANT
KIT ABG SYR 3ML LUER SLIP (SYRINGE) IMPLANT
NEEDLE HYPO 25X5/8 SAFETYGLIDE (NEEDLE) IMPLANT
NS IRRIG 1000ML POUR BTL (IV SOLUTION) ×5 IMPLANT
PACK C SECTION WH (CUSTOM PROCEDURE TRAY) ×5 IMPLANT
PAD ABD 8X7 1/2 STERILE (GAUZE/BANDAGES/DRESSINGS) ×5 IMPLANT
PAD OB MATERNITY 4.3X12.25 (PERSONAL CARE ITEMS) ×5 IMPLANT
RETRACTOR WND ALEXIS 25 LRG (MISCELLANEOUS) ×3 IMPLANT
RTRCTR WOUND ALEXIS 25CM LRG (MISCELLANEOUS) ×5
STAPLER VISISTAT 35W (STAPLE) IMPLANT
STRIP CLOSURE SKIN 1/2X4 (GAUZE/BANDAGES/DRESSINGS) ×4 IMPLANT
SUT MNCRL 0 VIOLET CTX 36 (SUTURE) ×6 IMPLANT
SUT MONOCRYL 0 CTX 36 (SUTURE) ×4
SUT PDS AB 0 CTX 60 (SUTURE) IMPLANT
SUT PLAIN 2 0 XLH (SUTURE) ×5 IMPLANT
SUT VIC AB 0 CT1 27 (SUTURE) ×6
SUT VIC AB 0 CT1 27XBRD ANBCTR (SUTURE) ×9 IMPLANT
SUT VIC AB 2-0 CT1 27 (SUTURE) ×3
SUT VIC AB 2-0 CT1 TAPERPNT 27 (SUTURE) ×3 IMPLANT
SUT VIC AB 4-0 KS 27 (SUTURE) ×5 IMPLANT
TOWEL OR 17X24 6PK STRL BLUE (TOWEL DISPOSABLE) ×5 IMPLANT
TRAY FOLEY CATH 14FR (SET/KITS/TRAYS/PACK) ×5 IMPLANT
WATER STERILE IRR 1000ML POUR (IV SOLUTION) ×5 IMPLANT

## 2014-03-20 NOTE — Anesthesia Preprocedure Evaluation (Signed)
Anesthesia Evaluation  Patient identified by MRN, date of birth, ID band Patient awake    Reviewed: Allergy & Precautions, H&P , NPO status , Patient's Chart, lab work & pertinent test results, reviewed documented beta blocker date and time   History of Anesthesia Complications Negative for: history of anesthetic complications  Airway       Dental   Pulmonary neg pulmonary ROS,          Cardiovascular negative cardio ROS      Neuro/Psych  Headaches, negative psych ROS   GI/Hepatic Neg liver ROS, GERD-  Medicated,  Endo/Other  Morbid obesity  Renal/GU negative Renal ROS     Musculoskeletal   Abdominal   Peds  Hematology Antiphospholipid antibody syndrome - has been on baby aspirin only   Anesthesia Other Findings Anaphylactic allergy to NSAIDS and Aspirin (but takes baby ASA daily)  Reproductive/Obstetrics (+) Pregnancy (fetal intolerance to labor --> C/S)                           Anesthesia Physical Anesthesia Plan  ASA: III and emergent  Anesthesia Plan: Spinal   Post-op Pain Management:    Induction:   Airway Management Planned:   Additional Equipment:   Intra-op Plan:   Post-operative Plan:   Informed Consent: I have reviewed the patients History and Physical, chart, labs and discussed the procedure including the risks, benefits and alternatives for the proposed anesthesia with the patient or authorized representative who has indicated his/her understanding and acceptance.     Plan Discussed with: Surgeon and CRNA  Anesthesia Plan Comments:         Anesthesia Quick Evaluation

## 2014-03-20 NOTE — Transfer of Care (Signed)
Immediate Anesthesia Transfer of Care Note  Patient: Charlette CaffeyLois H Gosdin  Procedure(s) Performed: Procedure(s): CESAREAN SECTION (N/A) UNILATERAL SALPINGECTOMY (Right) BILATERAL TUBAL LIGATION (Bilateral)  Patient Location: PACU  Anesthesia Type:Spinal  Level of Consciousness: awake, alert  and oriented  Airway & Oxygen Therapy: Patient Spontanous Breathing  Post-op Assessment: Report given to PACU RN and Post -op Vital signs reviewed and stable  Post vital signs: Reviewed and stable  Complications: No apparent anesthesia complications

## 2014-03-20 NOTE — Anesthesia Postprocedure Evaluation (Signed)
  Anesthesia Post-op Note  Anesthesia Post Note  Patient: Sheryl CaffeyLois H Raimondo  Procedure(s) Performed: Procedure(s) (LRB): CESAREAN SECTION (N/A) UNILATERAL SALPINGECTOMY (Right) BILATERAL TUBAL LIGATION (Bilateral)  Anesthesia type: Spinal  Patient location: PACU  Post pain: Pain level controlled  Post assessment: Post-op Vital signs reviewed  Last Vitals:  Filed Vitals:   03/20/14 0443  BP: 124/82  Pulse:   Temp: 36.3 C  Resp: 22    Post vital signs: Reviewed  Level of consciousness: awake  Complications: No apparent anesthesia complications

## 2014-03-20 NOTE — Anesthesia Procedure Notes (Signed)
Spinal  Patient location during procedure: OR Start time: 03/20/2014 2:08 AM Staffing Anesthesiologist: CASSIDY, AMY Performed by: anesthesiologist  Preanesthetic Checklist Completed: patient identified, site marked, surgical consent, pre-op evaluation, timeout performed, IV checked, risks and benefits discussed and monitors and equipment checked Spinal Block Patient position: sitting Prep: site prepped and draped and DuraPrep Patient monitoring: heart rate, continuous pulse ox and blood pressure Approach: midline Location: L3-4 Injection technique: single-shot Needle Needle type: Pencan  Needle gauge: 24 G Needle length: 10 cm Assessment Sensory level: T4 Additional Notes Clear free flow CSF on first attempt. Transient right paresthesia.  Patient tolerated procedure well with no apparent complications.  Jasmine DecemberA. Cassidy, MD

## 2014-03-20 NOTE — Progress Notes (Signed)
Discussed risks and benefits of c/s with patient. Consents signed

## 2014-03-20 NOTE — Anesthesia Postprocedure Evaluation (Signed)
  Anesthesia Post-op Note  Anesthesia Post Note  Patient: Sheryl CaffeyLois H Goldinger  Procedure(s) Performed: Procedure(s) (LRB): CESAREAN SECTION (N/A) UNILATERAL SALPINGECTOMY (Right) BILATERAL TUBAL LIGATION (Bilateral)  Anesthesia type: Spinal  Patient location: Mother/Baby  Post pain: Pain level controlled  Post assessment: Post-op Vital signs reviewed  Last Vitals:  Filed Vitals:   03/20/14 0730  BP: 99/63  Pulse: 75  Temp: 36.8 C  Resp: 18    Post vital signs: Reviewed  Level of consciousness: awake  Complications: No apparent anesthesia complications

## 2014-03-20 NOTE — Progress Notes (Signed)
Pt having some intermittent contractions.  Over the last hour has has multiple decels that look like lates but it is difficult to trace contractions.  120s g STV, NST R but multiple decels  Toco ? q5-10  Will do contraction stress test now with pitocin- if multiple late decels occur, will proceed with C/S for non reassuring FHTs remote from vaginal delivery.

## 2014-03-20 NOTE — Addendum Note (Signed)
Addendum created 03/20/14 0851 by Turner DanielsJennifer L Aniyha Tate, CRNA   Modules edited: Notes Section   Notes Section:  File: [id number]

## 2014-03-20 NOTE — Addendum Note (Signed)
Addendum created 03/20/14 [id number] by Dana AllanAmy Minnie Legros, MD   Modules edited: Orders

## 2014-03-20 NOTE — Op Note (Signed)
03/19/2014 - 03/20/2014  2:52 AM  PATIENT:  Sheryl CaffeyLois H Kenner  43 y.o. female  PRE-OPERATIVE DIAGNOSIS:  fetal intolerance of labor, desires sterilization  POST-OPERATIVE DIAGNOSIS:  fetal intolerance of labor, same plus 6 cm R paratubal cyst twisted with normal ovary on R  PROCEDURE:  Procedure(s): CESAREAN SECTION (N/A) UNILATERAL SALPINGECTOMY (Right) BILATERAL TUBAL LIGATION (Bilateral)  SURGEON:  Surgeon(s) and Role:    * Loney LaurenceMichelle A Eliyah Mcshea, MD - Primary   ANESTHESIA:   spinal  EBL:  Total I/O In: 800 [I.V.:800] Out: 900 [Urine:300; Blood:600]   SPECIMEN:  Source of Specimen:  placenta, R tube and cyst  DISPOSITION OF SPECIMEN:  PATHOLOGY  COUNTS:  YES  TOURNIQUET:  * No tourniquets in log *  DICTATION: .Note written in EPIC  PLAN OF CARE: Admit to inpatient   PATIENT DISPOSITION:  PACU - hemodynamically stable.   Delay start of Pharmacological VTE agent (>24hrs) due to surgical blood loss or risk of bleeding: no  Complications:  none Medications:  Ancef, Pitocin Findings:  Baby female, Apgars 9,9, weight P.   Normal L tube, R tube with 6 cm paratubal cyst, normal ovaries and uterus seen.  Baby was skin to skin with mother after birth in the OR.  Technique:  After adequate spinal anesthesia was achieved, the patient was prepped and draped in usual sterile fashion.  A foley catheter was used to drain the bladder.  A pfannanstiel incision was made with the scalpel and carried down to the fascia with the bovie cautery. The fascia was incised in the midline with the scalpel and carried in a transverse curvilinear manner bilaterally.  The fascia was reflected superiorly and inferiorly off the rectus muscles and the muscles split in the midline.  A bowel free portion of the peritoneum was entered bluntly and then extended in a superior and inferior manner with good visualization of the bowel and bladder.  The Alexis instrument was then placed and the vesico-uterine fascia  tented up and incised in a transverse curvilinear manner.  A 2 cm transverse incision was made in the upper portion of the lower uterine segment until the amnion was exposed.   The incision was extended transversely in a blunt manner.  Clear fluid was noted and the baby delivered in the vertex presentation without complication.  The baby was bulb suctioned and the cord was clamped and cut.  The baby was then handed to awaiting Neonatology.  The placenta was then delivered manually and the uterus cleared of all debris.  The uterine incision was then closed with a running lock stitch of 0 monocryl.  An imbricating layer of 0 monocryl was closed as well. Excellent hemostasis of the uterine incision was achieved and the abdomen was cleared with irrigation.   The tubes were identified at this point and followed to their fimbriated ends.  The L tube was grasped with a babcock and the filchie clip placed on the isthmic portion.  On the right, a large para tubal cyst which incorporated the fimbria was twisted with the normal end of the R ovary.  This was untwisted and the mesosalpinx was clamped with a kelly next to the ovary.  Another kelly clamped the tube.  A small hole was made in the mesosalpinx in the middle and each side was tied off with a tie under the kelly.  The vessels of the mesosalpinx were tied again.  Each pedicle was incised and the cyst, tubal portion and fimbria was sent to path.  The peritoneum was closed with a running stitch of 2-0 vicryl.  This incorporated the rectus muscles as a separate layer.  The fascia was then closed with a running stitch of 0 vicryl.  The subcutaneous layer was closed with interrupted  stitches of 2-0 plain gut.  The skin was closed with 4-0 vicryl on a Keith needle and steri-strips.  The patient tolerated the procedure well and was returned to the recovery room in stable condition.  All counts were correct times three.  Yiselle Babich A

## 2014-03-20 NOTE — Progress Notes (Signed)
Pt failed CTX test- multiple late decels with contractions.  Will proceed with LTCS- all r/b/alt d/w pt.

## 2014-03-20 NOTE — Brief Op Note (Signed)
03/19/2014 - 03/20/2014  2:52 AM  PATIENT:  Sheryl [name]  42 y.o. female  PRE-OPERATIVE DIAGNOSIS:  fetal intolerance of labor, desires sterilization  POST-OPERATIVE DIAGNOSIS:  fetal intolerance of labor, same plus 6 cm R paratubal cyst twisted with normal ovary on R  PROCEDURE:  Procedure(s): CESAREAN SECTION (N/A) UNILATERAL SALPINGECTOMY (Right) BILATERAL TUBAL LIGATION (Bilateral)  SURGEON:  Surgeon(s) and Role:    * Chanceler Pullin A Shenae Bonanno, MD - Primary   ANESTHESIA:   spinal  EBL:  Total I/O In: 800 [I.V.:800] Out: 900 [Urine:300; Blood:600]   SPECIMEN:  Source of Specimen:  placenta, R tube and cyst  DISPOSITION OF SPECIMEN:  PATHOLOGY  COUNTS:  YES  TOURNIQUET:  * No tourniquets in log *  DICTATION: .Note written in EPIC  PLAN OF CARE: Admit to inpatient   PATIENT DISPOSITION:  PACU - hemodynamically stable.   Delay start of Pharmacological VTE agent (>24hrs) due to surgical blood loss or risk of bleeding: no  Complications:  none Medications:  Ancef, Pitocin Findings:  Baby female, Apgars 9,9, weight P.   Normal L tube, R tube with 6 cm paratubal cyst, normal ovaries and uterus seen.  Baby was skin to skin with mother after birth in the OR.  Technique:  After adequate spinal anesthesia was achieved, the patient was prepped and draped in usual sterile fashion.  A foley catheter was used to drain the bladder.  A pfannanstiel incision was made with the scalpel and carried down to the fascia with the bovie cautery. The fascia was incised in the midline with the scalpel and carried in a transverse curvilinear manner bilaterally.  The fascia was reflected superiorly and inferiorly off the rectus muscles and the muscles split in the midline.  A bowel free portion of the peritoneum was entered bluntly and then extended in a superior and inferior manner with good visualization of the bowel and bladder.  The Alexis instrument was then placed and the vesico-uterine fascia  tented up and incised in a transverse curvilinear manner.  A 2 cm transverse incision was made in the upper portion of the lower uterine segment until the amnion was exposed.   The incision was extended transversely in a blunt manner.  Clear fluid was noted and the baby delivered in the vertex presentation without complication.  The baby was bulb suctioned and the cord was clamped and cut.  The baby was then handed to awaiting Neonatology.  The placenta was then delivered manually and the uterus cleared of all debris.  The uterine incision was then closed with a running lock stitch of 0 monocryl.  An imbricating layer of 0 monocryl was closed as well. Excellent hemostasis of the uterine incision was achieved and the abdomen was cleared with irrigation.   The tubes were identified at this point and followed to their fimbriated ends.  The L tube was grasped with a babcock and the filchie clip placed on the isthmic portion.  On the right, a large para tubal cyst which incorporated the fimbria was twisted with the normal end of the R ovary.  This was untwisted and the mesosalpinx was clamped with a kelly next to the ovary.  Another kelly clamped the tube.  A small hole was made in the mesosalpinx in the middle and each side was tied off with a tie under the kelly.  The vessels of the mesosalpinx were tied again.  Each pedicle was incised and the cyst, tubal portion and fimbria was sent to path.     The peritoneum was closed with a running stitch of 2-0 vicryl.  This incorporated the rectus muscles as a separate layer.  The fascia was then closed with a running stitch of 0 vicryl.  The subcutaneous layer was closed with interrupted  stitches of 2-0 plain gut.  The skin was closed with 4-0 vicryl on a Keith needle and steri-strips.  The patient tolerated the procedure well and was returned to the recovery room in stable condition.  All counts were correct times three.  Kendy Haston A   

## 2014-03-21 ENCOUNTER — Encounter (HOSPITAL_COMMUNITY): Payer: Self-pay | Admitting: Obstetrics and Gynecology

## 2014-03-21 MED ORDER — PROMETHAZINE HCL 25 MG PO TABS
25.0000 mg | ORAL_TABLET | Freq: Four times a day (QID) | ORAL | Status: DC | PRN
  Administered 2014-03-21: 25 mg via ORAL
  Filled 2014-03-21: qty 1

## 2014-03-21 MED ORDER — METOCLOPRAMIDE HCL 10 MG PO TABS
10.0000 mg | ORAL_TABLET | Freq: Three times a day (TID) | ORAL | Status: DC | PRN
  Administered 2014-03-21: 10 mg via ORAL
  Filled 2014-03-21: qty 1

## 2014-03-21 NOTE — Lactation Note (Signed)
This note was copied from the chart of Boy Coralie CarpenLois Kimm. Lactation Consultation Note  Patient Name: Boy Coralie CarpenLois Lasater [id number]'XToday's Date: 03/21/2014 Reason for consult: Follow-up assessment LC saw mom early this morning and she is an experienced breastfeeding mom with LATCH scores of 7/8 today with output wnl for this hour of life.   Maternal Data    Feeding Feeding Type: Breast Fed Length of feed: 0 min (sleepy)  LATCH Score/Interventions               Most recent LATCH score=8 per RN assessment       Lactation Tools Discussed/Used   LC visit deferred until tomorrow  Consult Status Consult Status: PRN Follow-up type: In-patient    Warrick ParisianBryant, [place] [facility] 03/21/2014, 8:01 PM

## 2014-03-21 NOTE — Lactation Note (Signed)
This note was copied from the chart of Sheryl [name] [place]. Lactation Consultation Note BF 1st child for 6 months and 2nd child for 8 months, last child 6656yrs. Old. Basic BF reviewed as things has changed in 9 yrs. States this baby is BF well w/o challenges. Noted mom has large pendulum breast. Encouraged to elevate w/cloth. Demonstrated several positions. Encouraged deep latches. Mom encouraged to feed baby 8-12 times/24 hours and with feeding cues. Mom encouraged to feed baby w/feeding cues. Referred to Baby and Me Book in Breastfeeding section Pg. 22-23 for position options and Proper latch demonstration. Mom shown how to nurse baby in laid-back nursing position. Encouraged to call for assistance if needed and to verify proper latch.Hand expression taught to Mom. Hand expression taught to Mom. WH/LC brochure given w/resources, support groups and LC services. Mom encouraged to do skin-to-skin. Patient Name: Sheryl [name] Hirota ZOXWR'UToday's Date: 03/21/2014 Reason for consult: Initial assessment   Maternal Data    Feeding Feeding Type: Breast Fed Length of feed: 10 min  LATCH Score/Interventions Latch: Repeated attempts needed to sustain latch, nipple held in mouth throughout feeding, stimulation needed to elicit sucking reflex. Intervention(s): Adjust position;Assist with latch;Breast massage;Breast compression  Audible Swallowing: None Intervention(s): Skin to skin;Hand expression Intervention(s): Skin to skin;Hand expression;Alternate breast massage  Type of Nipple: Everted at rest and after stimulation  Comfort (Breast/Nipple): Soft / non-tender     Hold (Positioning): Assistance needed to correctly position infant at breast and maintain latch. Intervention(s): Breastfeeding basics reviewed;Support Pillows;Position options;Skin to skin  LATCH Score: 6  Lactation Tools Discussed/Used     Consult Status Consult Status: Follow-up Date: 03/22/14 Follow-up type: In-patient    Charyl DancerCARVER,  Bryten Maher G 03/21/2014, 7:18 AM

## 2014-03-21 NOTE — Progress Notes (Signed)
Subjective: Postpartum Day 1: Cesarean Delivery Patient reports pain from incision controlled, having more discomfort from uterine contractions. + flatus, no nausea  Objective: Vital signs in last 24 hours: Temp:  [97.6 F (36.4 C)-98.4 F (36.9 C)] 98.3 F (36.8 C) (07/16 0500) Pulse Rate:  [80-102] 98 (07/16 0500) Resp:  [16-18] 18 (07/16 0500) BP: (98-125)/(68-80) 125/80 mmHg (07/16 0500) SpO2:  [97 %-98 %] 97 % (07/16 0245)  Physical Exam:  General: alert, cooperative and appears stated age Lochia: appropriate Uterine Fundus: firm Incision: Dressing C/D/I DVT Evaluation: No evidence of DVT seen on physical exam.   Recent Labs  03/19/14 1805 03/20/14 0715  HGB 11.0* 8.9*  HCT 33.4* 27.9*    Assessment/Plan: Status post Cesarean section. Doing well postoperatively.  Continue current care. Desires neonatal circ, R/B/A reviewed, will proceed  Rayan Dyal H. 03/21/2014, 12:23 PM

## 2014-03-22 MED ORDER — OXYCODONE-ACETAMINOPHEN 5-325 MG PO TABS: 1.0000 | ORAL_TABLET | ORAL | Status: DC | PRN

## 2014-03-22 NOTE — Progress Notes (Signed)
POD#2 Pt is doing well. Bowel function improved. Baby has one testicle not descended. VSSAF IMP/ Stable Plan/ Will discharge.

## 2014-03-22 NOTE — Discharge Summary (Signed)
Obstetric Discharge Summary Reason for Admission: induction of labor Prenatal Procedures: NST, CST and ultrasound Intrapartum Procedures: cesarean: low cervical, transverse and tubal ligation Postpartum Procedures: none Complications-Operative and Postpartum: none Hemoglobin  Date Value Ref Range Status  03/20/2014 8.9* 12.0 - 15.0 g/dL Final     DELTA CHECK NOTED     REPEATED TO VERIFY     HCT  Date Value Ref Range Status  03/20/2014 27.9* 36.0 - 46.0 % Final    Physical Exam:  General: alert Lochia: appropriate Uterine Fundus: firm Incision: healing well DVT Evaluation: No evidence of DVT seen on physical exam.  Discharge Diagnoses: Term Pregnancy-delivered and fetal intolerence to labor  Discharge Information: Date: 03/22/2014 Activity: pelvic rest Diet: routine Medications: PNV, Ibuprofen and Percocet Condition: stable Instructions: refer to practice specific booklet Discharge to: home Follow-up Information   Follow up with HORVATH,MICHELLE A, MD. Schedule an appointment as soon as possible for a visit in 1 month.   Specialty:  Obstetrics and Gynecology   Contact information:   [address] [place] [id number] [phone]       Newborn Data: Live born female  Birth Weight: 6 lb 11.8 oz (3056 g) APGAR: 9, 9  Home with mother.  Nirvana Blanchett E 03/22/2014, 9:11 AM

## 2014-04-30 ENCOUNTER — Other Ambulatory Visit: Payer: Self-pay | Admitting: Obstetrics and Gynecology

## 2014-05-01 LAB — CYTOLOGY - PAP

## 2014-07-08 ENCOUNTER — Encounter (HOSPITAL_COMMUNITY): Payer: Self-pay | Admitting: Obstetrics and Gynecology

## 2016-01-05 HISTORY — PX: KNEE ARTHROSCOPY: SUR90

## 2016-01-06 ENCOUNTER — Other Ambulatory Visit: Payer: Self-pay | Admitting: Orthopedic Surgery

## 2016-01-06 DIAGNOSIS — S83206A Unspecified tear of unspecified meniscus, current injury, right knee, initial encounter: Secondary | ICD-10-CM

## 2016-01-13 ENCOUNTER — Ambulatory Visit
Admission: RE | Admit: 2016-01-13 | Discharge: 2016-01-13 | Disposition: A | Discharge status: Home / Self Care | Payer: 59 | Source: Ambulatory Visit | Attending: Orthopedic Surgery | Admitting: Orthopedic Surgery

## 2016-01-13 DIAGNOSIS — S83206A Unspecified tear of unspecified meniscus, current injury, right knee, initial encounter: Secondary | ICD-10-CM

## 2016-03-29 IMAGING — US US OB LIMITED
1 series · 13 of 22 positions shown · non-contrast
Comparison: none

[Series 1: us ob limited · 0.32mm/px · 13 of 22 slices shown]
[im 1/22]
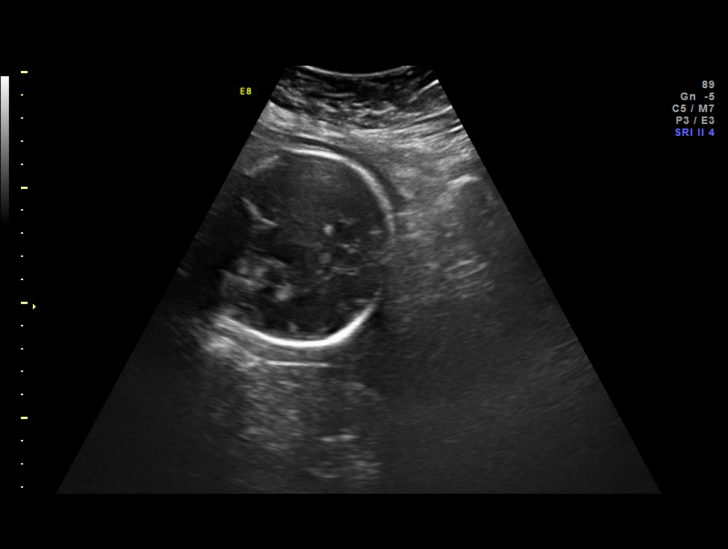
[im 3/22]
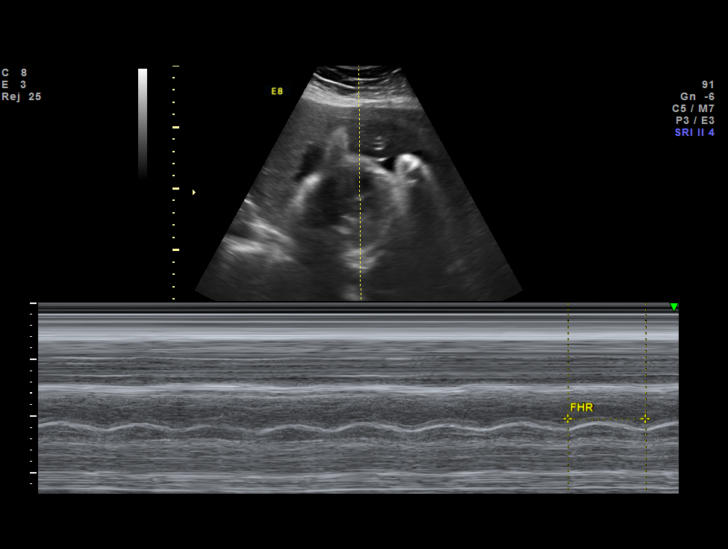
[im 5/22]
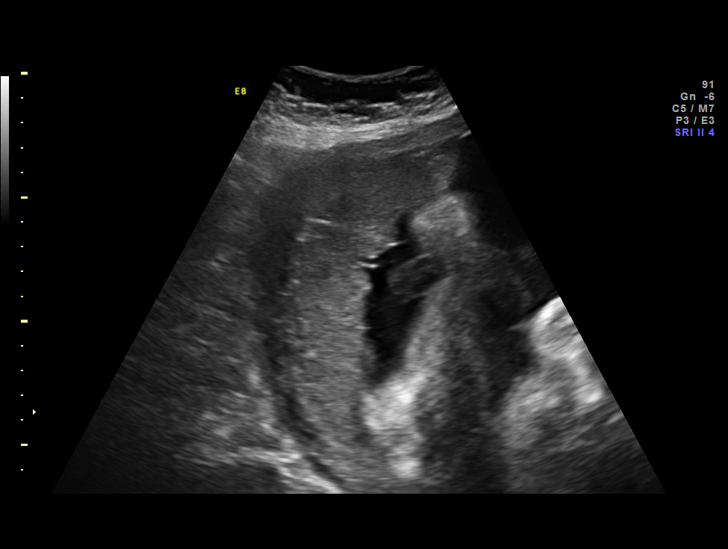
[im 6/22]
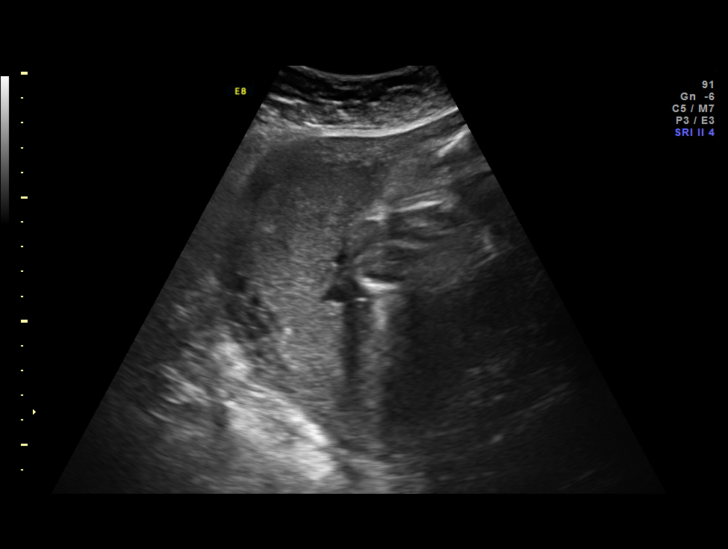
[im 8/22]
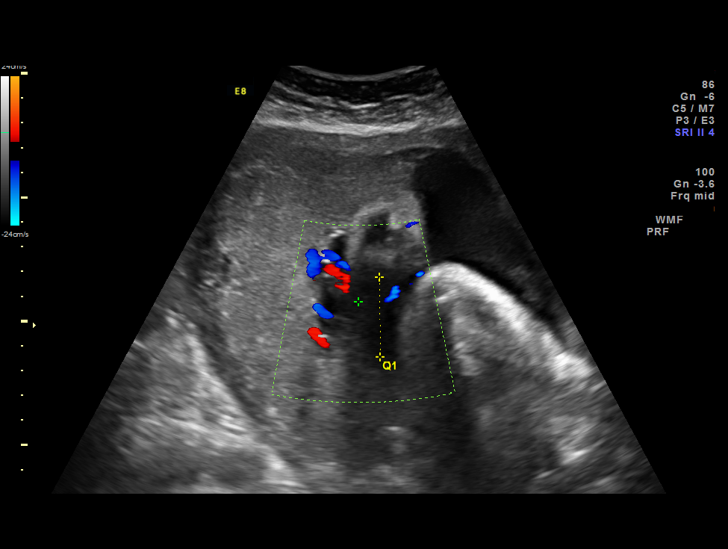
[im 10/22]
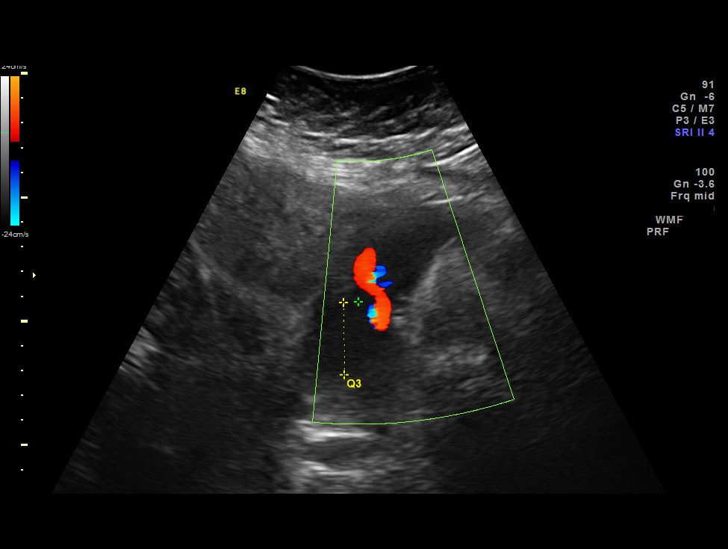
[im 12/22]
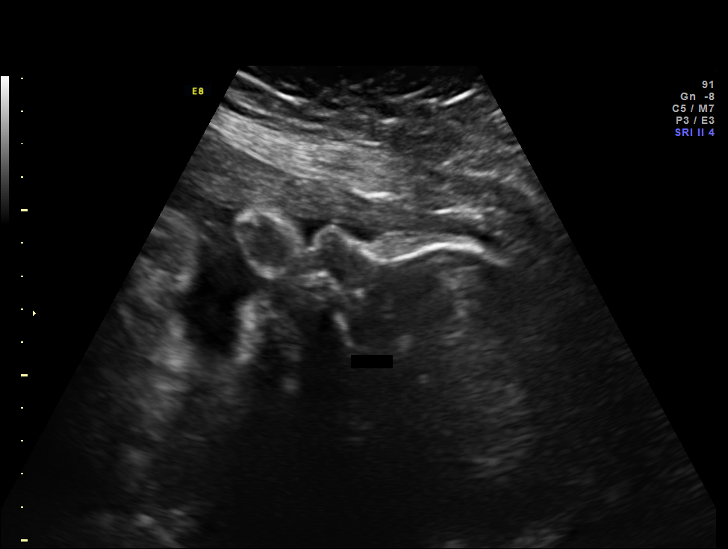
[im 13/22]
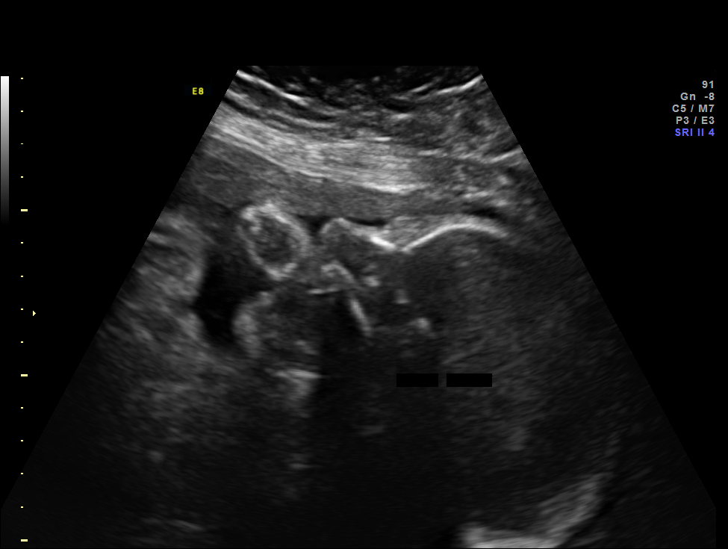
[im 15/22]
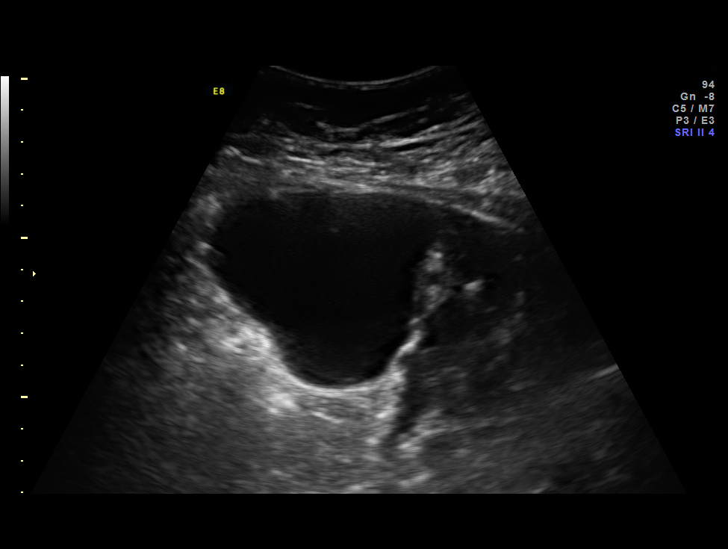
[im 17/22]
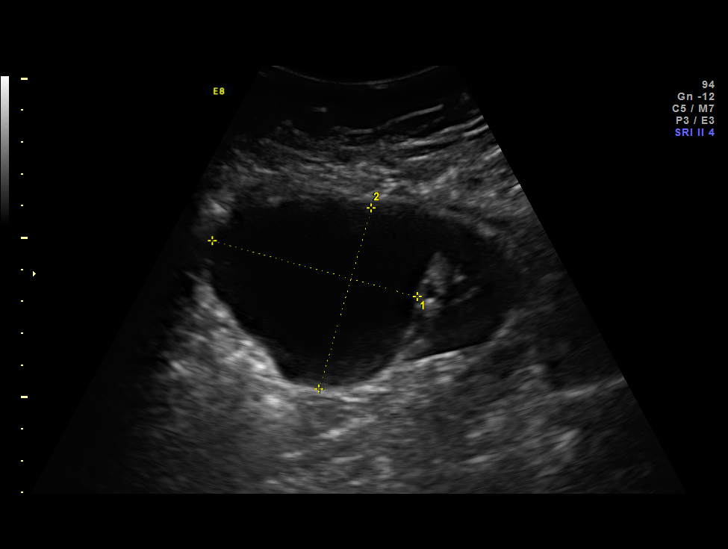
[im 18/22]
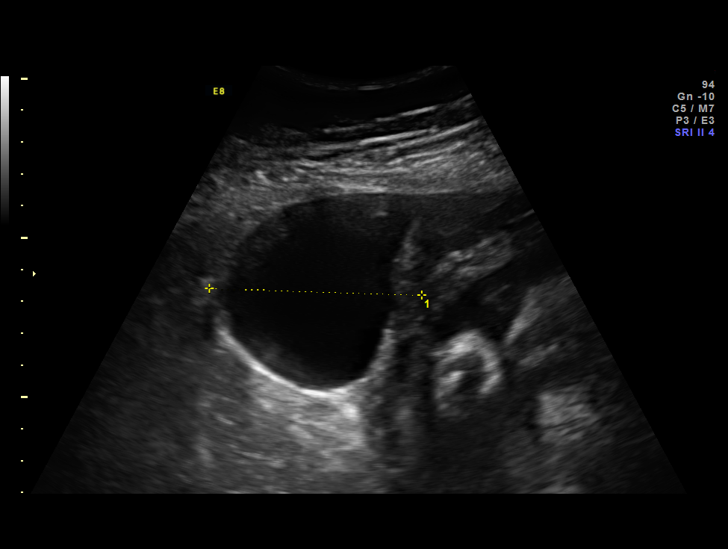
[im 20/22]
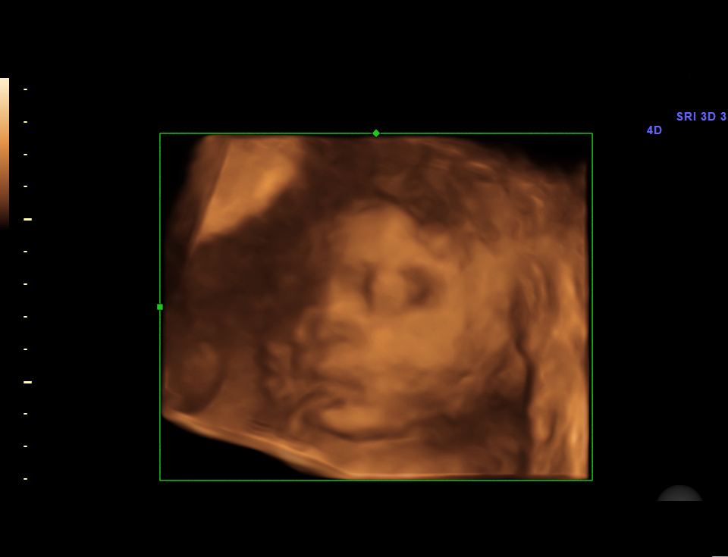
[im 22/22]
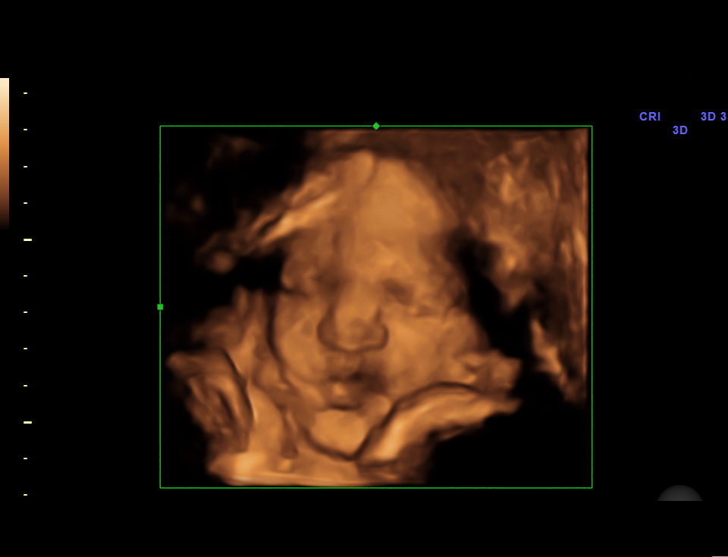

[13 of 22 positions shown; findings below may reference images not displayed]

OBSTETRICS REPORT
                      (Signed Final 02/05/2014 [DATE])

Service(s) Provided

 [HOSPITAL]                                         76815.0
Indications

 Maternal morbid obesity
 Previous pre-term deliveries - induced 34 wks
 (bleeding), 35 wks (low AF)
 + / - Antiphospholipid syndrome - baby ASA
 Advanced maternal age (42), Multigravida -
 declined genetic screening
 Ovarian cyst (simple)
 Assess amniotic fluid volume
Fetal Evaluation

 Num Of Fetuses:    1
 Fetal Heart Rate:  130                          bpm
 Cardiac Activity:  Observed
 Presentation:      Cephalic
 Placenta:          Posterior, above cervical
                    os
 P. Cord            Previously Visualized
 Insertion:

 Amniotic Fluid
 AFI FV:      Subjectively within normal limits
 AFI Sum:     12.97   cm       39  %Tile     Larg Pckt:    3.82  cm
 RUQ:   3.22    cm   RLQ:    3.01   cm    LUQ:   3.82    cm   LLQ:    2.92   cm
Gestational Age

 LMP:           31w 6d        Date:  06/27/13                 EDD:   04/03/14
 Best:          31w 6d     Det. By:  LMP  (06/27/13)          EDD:   04/03/14
Cervix Uterus Adnexa

 Cervix:       Not visualized (advanced GA >25wks)
 Right Ovary:   Simple cyst again seen measuring 6.7 x 5.9 x 5.5cm.
Impression

 SIUP at 31+6 weeks
 Normal amniotic fluid volume
 NST reactive in office
Recommendations

 Keep appts for growth on [DATE] and AFI on [DATE]

 questions or concerns.

## 2016-04-05 IMAGING — US US OB FOLLOW-UP
1 series · 12 of 28 positions shown · non-contrast
Comparison: none

[Series 1: us ob follow-up · 0.27mm/px · 12 of 28 slices shown]
[im 2/28]
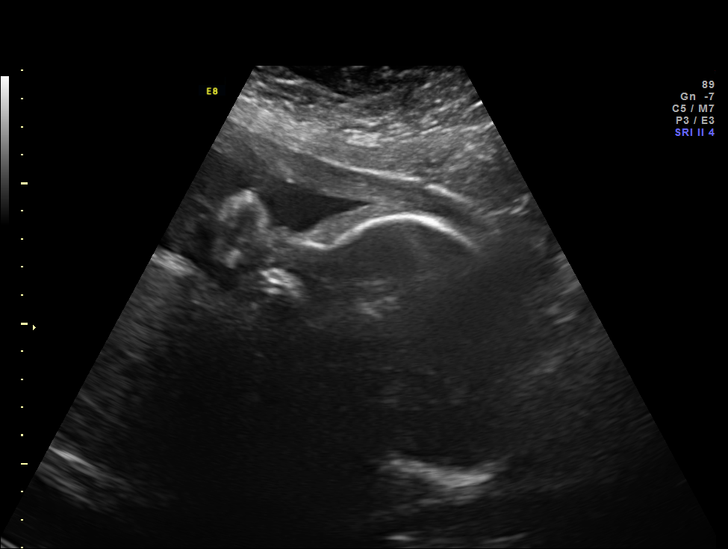
[im 4/28]
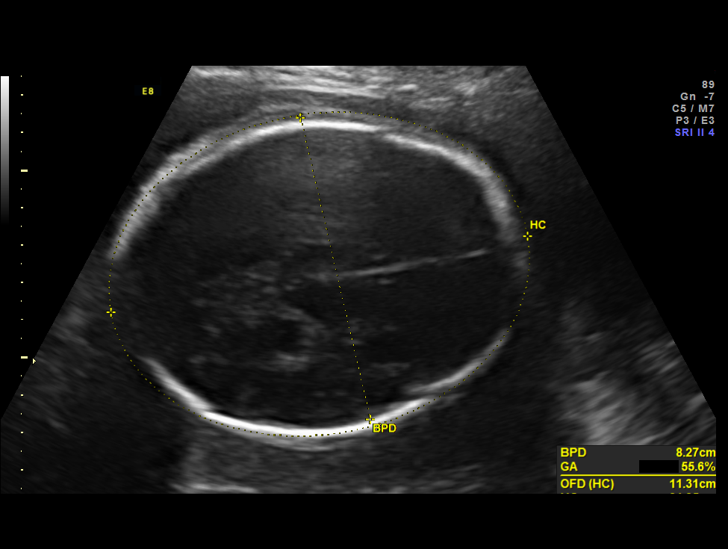
[im 6/28]
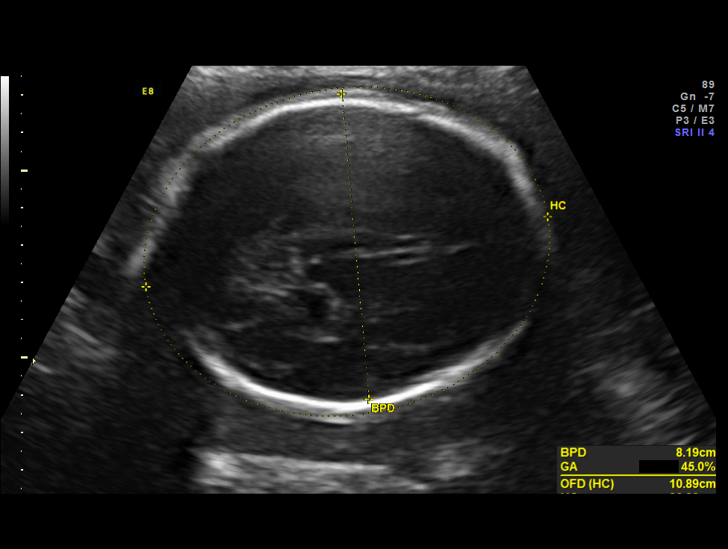
[im 9/28]
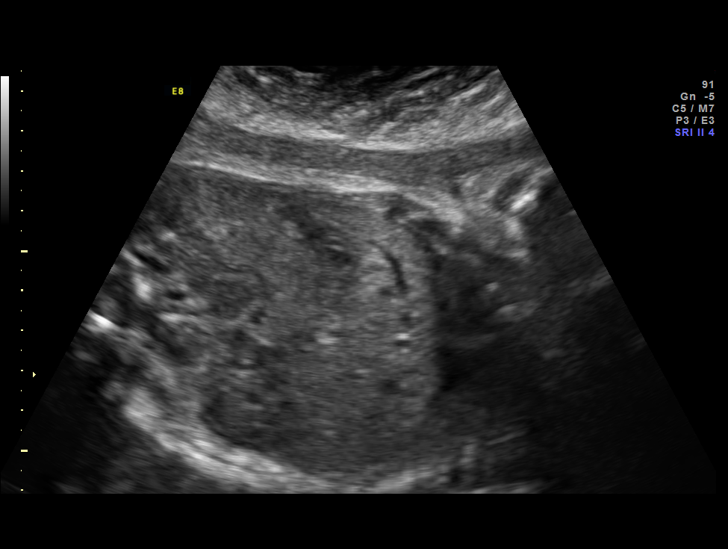
[im 11/28]
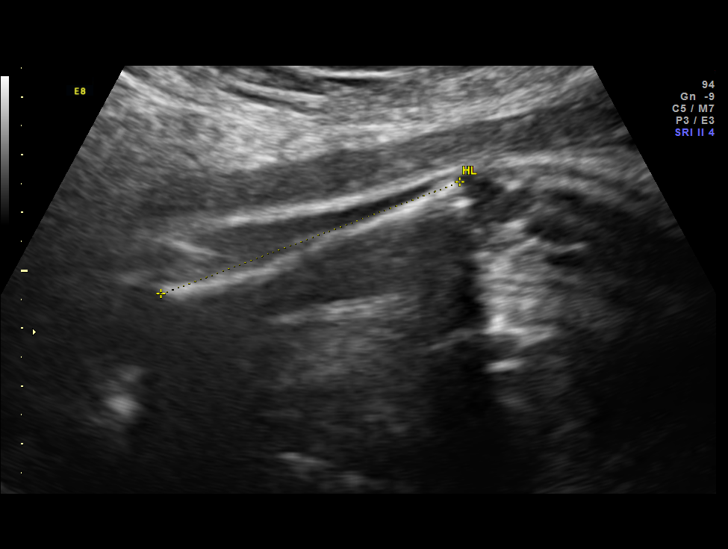
[im 13/28]
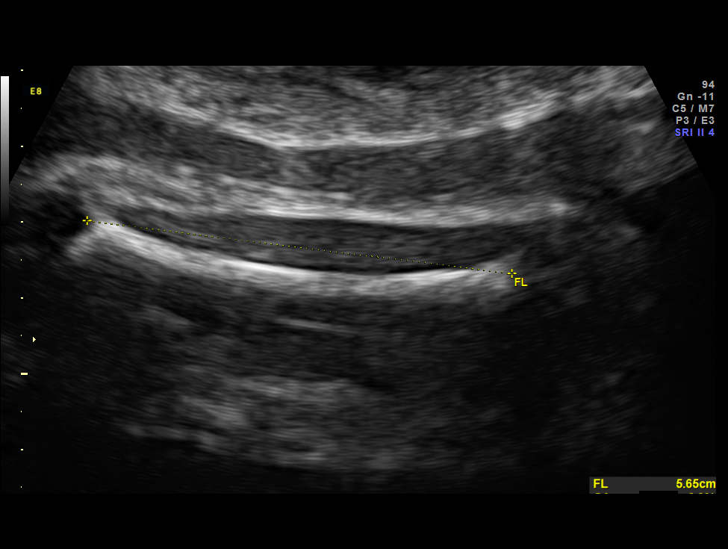
[im 16/28]
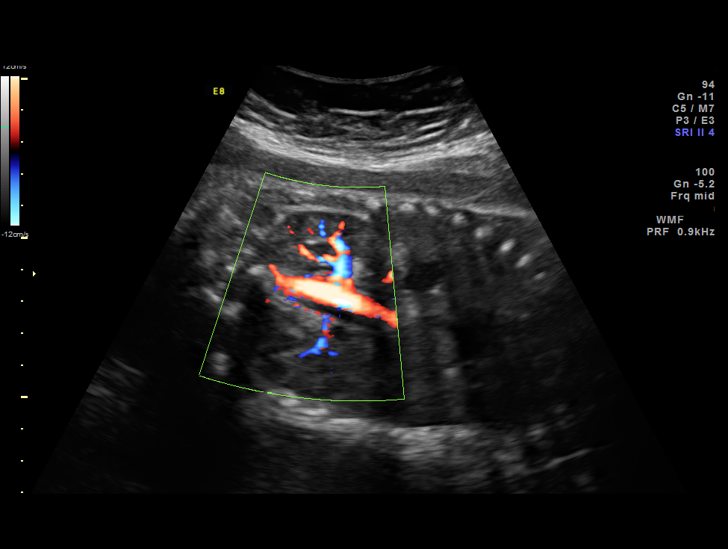
[im 18/28]
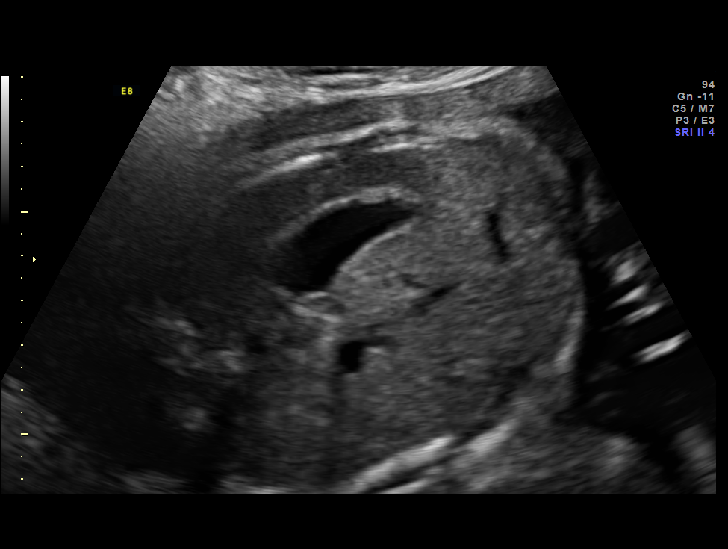
[im 20/28]
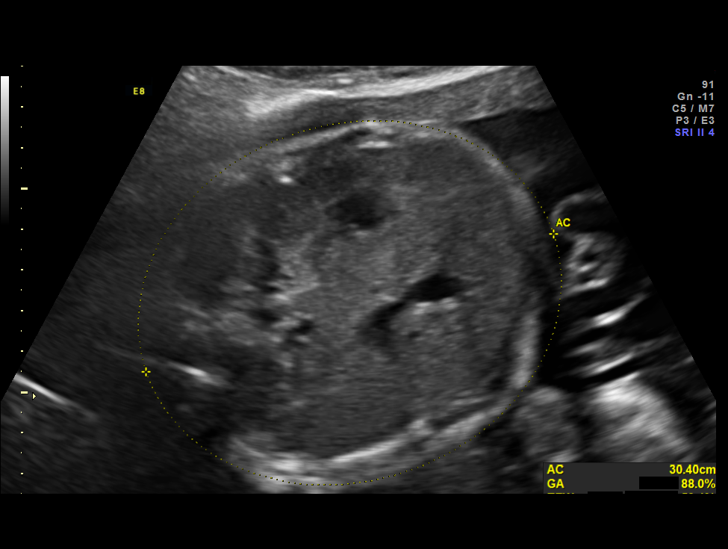
[im 23/28]
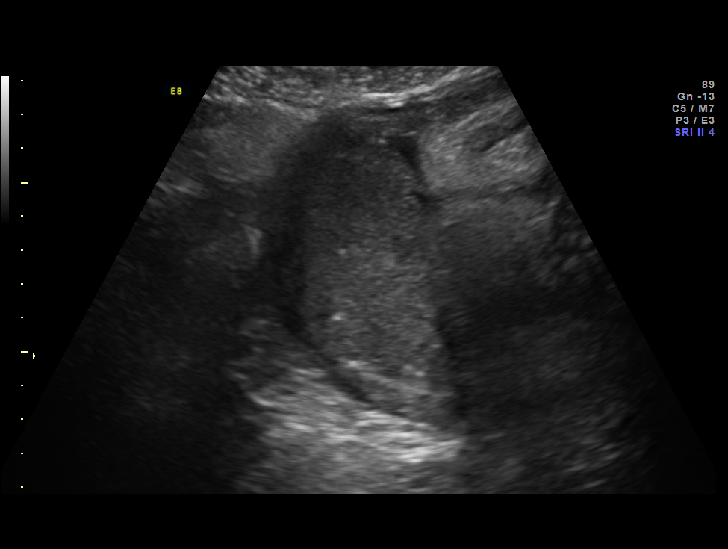
[im 25/28]
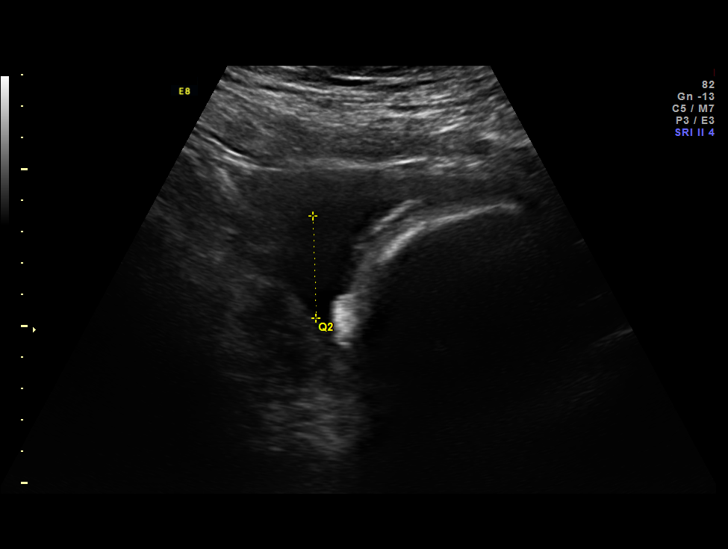
[im 27/28]
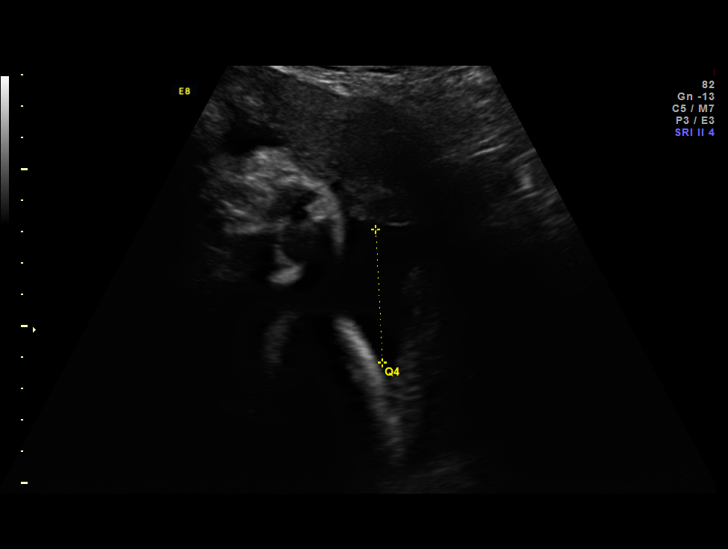

[12 of 28 positions shown; findings below may reference images not displayed]

OBSTETRICS REPORT
                      (Signed Final 02/12/2014 [DATE])

Service(s) Provided

 US OB FOLLOW UP                                       76816.1
Indications

 Maternal morbid obesity
 Previous pre-term deliveries - induced 34 wks
 (bleeding), 35 wks (low AF)
 + / - Antiphospholipid syndrome - baby ASA
 Advanced maternal age (42), Multigravida -
 declined genetic screening
 Ovarian cyst (simple)
Fetal Evaluation

 Num Of Fetuses:    1
 Fetal Heart Rate:  136                          bpm
 Cardiac Activity:  Observed
 Presentation:      Cephalic
 Placenta:          Posterior, above cervical
                    os
 P. Cord            Previously Visualized
 Insertion:

 Amniotic Fluid
 AFI FV:      Subjectively within normal limits
 AFI Sum:     14.96   cm       53  %Tile     Larg Pckt:    4.26  cm
 RUQ:   3.4     cm   RLQ:    4.26   cm    LUQ:   3.26    cm   LLQ:    4.04   cm
Biometry

 BPD:     82.3  mm     G. Age:  33w 1d                CI:         74.1   70 - 86
 OFD:      111  mm                                    FL/HC:      18.3   19.9 -

 HC:     311.2  mm     G. Age:  34w 6d       64  %    HC/AC:      1.04   0.96 -

 AC:     299.1  mm     G. Age:  33w 6d       78  %    FL/BPD:     69.4   71 - 87
 FL:      57.1  mm     G. Age:  30w 0d      < 3  %    FL/AC:      19.1   20 - 24
 HUM:     54.9  mm     G. Age:  32w 0d       39  %

 Est. FW:    6540  gm      4 lb 8 oz     56  %
Gestational Age

 LMP:           32w 6d        Date:  06/27/13                 EDD:   04/03/14
 U/S Today:     33w 0d                                        EDD:   04/02/14
 Best:          32w 6d     Det. By:  LMP  (06/27/13)          EDD:   04/03/14
Anatomy

 Cranium:          Previously seen        Aortic Arch:      Not well visualized
 Fetal Cavum:      Previously seen        Ductal Arch:      Previously seen
 Ventricles:       Appears normal         Diaphragm:        Appears normal
 Choroid Plexus:   Previously seen        Stomach:          Appears normal, left
                                                            sided
 Cerebellum:       Previously seen        Abdomen:          Previously seen
 Posterior Fossa:  Previously seen        Abdominal Wall:   Previously seen
 Nuchal Fold:      Not applicable (>20    Cord Vessels:     Previously seen
                   wks GA)
 Face:             Orbits nl; profile not Kidneys:          Appear normal
                   well visualized
 Lips:             Previously seen        Bladder:          Appears normal
 Heart:            Appears normal         Spine:            Previously seen
                   (4CH, axis, and
                   situs)
 RVOT:             Previously seen        Lower             Previously seen
                                          Extremities:
 LVOT:             Previously seen        Upper             Previously seen
                                          Extremities:

 Other:  Male gender. Heels previously seen.
Cervix Uterus Adnexa

 Cervix:       Not visualized (advanced GA >68wks)
Impression

 Active SIUP at 78w7d
 APS syndrome
 EFW 56th%
 No dysmorphic features demonstrated with limitations as
 documented above
 No previa
Recommendations

 1. continue twice weekly NST as scheduled in your office
 2. we arranged for weekly AFI and monthly growth in our unit.

 questions or concerns.

## 2016-04-27 ENCOUNTER — Encounter (HOSPITAL_COMMUNITY): Payer: Self-pay | Admitting: Obstetrics and Gynecology

## 2016-08-06 HISTORY — PX: KNEE ARTHROSCOPY: SUR90

## 2016-12-04 ENCOUNTER — Encounter (HOSPITAL_COMMUNITY): Payer: Self-pay

## 2016-12-04 ENCOUNTER — Ambulatory Visit (HOSPITAL_COMMUNITY)
Admission: EM | Admit: 2016-12-04 | Discharge: 2016-12-04 | Disposition: A | Discharge status: Home / Self Care | Payer: 59 | Attending: Internal Medicine | Admitting: Internal Medicine

## 2016-12-04 DIAGNOSIS — K625 Hemorrhage of anus and rectum: Secondary | ICD-10-CM | POA: Diagnosis not present

## 2016-12-04 DIAGNOSIS — R195 Other fecal abnormalities: Secondary | ICD-10-CM | POA: Diagnosis not present

## 2016-12-04 DIAGNOSIS — K648 Other hemorrhoids: Secondary | ICD-10-CM | POA: Diagnosis not present

## 2016-12-04 DIAGNOSIS — K644 Residual hemorrhoidal skin tags: Secondary | ICD-10-CM

## 2016-12-04 MED ORDER — ONDANSETRON 4 MG PO TBDP
ORAL_TABLET | ORAL | Status: AC
  Filled 2016-12-04: qty 1

## 2016-12-04 MED ORDER — ONDANSETRON 4 MG PO TBDP
8.0000 mg | ORAL_TABLET | Freq: Once | ORAL | Status: AC
  Administered 2016-12-04: 8 mg via ORAL

## 2016-12-04 MED ORDER — HYDROCORTISONE 2.5 % RE CREA: 1.0000 "application " | TOPICAL_CREAM | Freq: Three times a day (TID) | RECTAL | 1 refills | Status: DC

## 2016-12-04 MED ORDER — KETOROLAC TROMETHAMINE 60 MG/2ML IM SOLN
INTRAMUSCULAR | Status: AC
  Filled 2016-12-04: qty 2

## 2016-12-04 MED ORDER — KETOROLAC TROMETHAMINE 60 MG/2ML IM SOLN
60.0000 mg | Freq: Once | INTRAMUSCULAR | Status: AC
  Administered 2016-12-04: 60 mg via INTRAMUSCULAR

## 2016-12-04 NOTE — ED Provider Notes (Signed)
CSN: [id number]     Arrival date & time 12/04/16  1205 History   First MD Initiated Contact with Patient 12/04/16 1307     Chief Complaint  Patient presents with  . Rectal Bleeding   (Consider location/radiation/quality/duration/timing/severity/associated sxs/prior Treatment)  Patient is a 46 y.o. Female, here for rectal bleeding for the last 3 nights accompany by rectal pain. She reports waking up this morning with blood on her bed (size her her palm) and on her underwear. Her rectal pain is worst wit sitting and is describe as excoriating. She notices blood in stool but more so on the tissue when she wiped. She is also having a migraine right now with photosensitivity and nausea however states migraine is not too bothersome right now.      The history is provided by the patient.  Rectal Bleeding  Quality:  Bright red Amount:  Moderate Duration:  3 days Timing:  Constant Chronicity:  New Context: hemorrhoids and rectal pain   Context: not constipation, not diarrhea and not rectal injury   Pain details:    Quality:  Sharp   Timing:  Constant   Progression:  Unchanged Associated symptoms: dizziness and fever   Associated symptoms: no light-headedness, no loss of consciousness and no vomiting     Past Medical History:  Diagnosis Date  . Allergic rhinitis   . Chronic migraine   . Migraines   . Miscarriage 08/1996, 12/1996   x2   Past Surgical History:  Procedure Laterality Date  . BREAST SURGERY     cyst removed from left breast; benign  . CESAREAN SECTION N/A 03/20/2014   Procedure: CESAREAN SECTION;  Surgeon: Loney Laurence, MD;  Location: [facility];  Service: Obstetrics;  Laterality: N/A;  . cyst removed from L breast  10/2006   benign  . DILATION AND CURETTAGE OF UTERUS  08/1996, 12/1996   x 2  . DILATION AND CURETTAGE OF UTERUS     x2  . live births  10/07/97, 02/08/2000   x2  . TUBAL LIGATION Bilateral 03/20/2014   Procedure: BILATERAL TUBAL LIGATION;  Surgeon:  Loney Laurence, MD;  Location: [facility];  Service: Obstetrics;  Laterality: Bilateral;  . UNILATERAL SALPINGECTOMY Right 03/20/2014   Procedure: UNILATERAL SALPINGECTOMY;  Surgeon: Loney Laurence, MD;  Location: [facility];  Service: Obstetrics;  Laterality: Right;   Family History  Problem Relation Age of Onset  . Asthma Son   . Heart disease Maternal Grandfather   . Cancer Paternal Grandmother     unsure what kind   Social History  Substance Use Topics  . Smoking status: Never Smoker  . Smokeless tobacco: Never Used  . Alcohol use No   OB History    Gravida Para Term Preterm AB Living   SAB TAB Ectopic Multiple Live Births   2 0 0   1     Review of Systems  Constitutional: Positive for fever. Negative for fatigue.       As stated in the HPI  Gastrointestinal: Positive for hematochezia. Negative for vomiting.  Neurological: Positive for dizziness and headaches. Negative for loss of consciousness and light-headedness.    Allergies  Advil [ibuprofen] and Salicylates  Home Medications   Prior to Admission medications   Medication Sig Start Date End Date Taking? Authorizing Provider  Multiple Vitamin (MULTIVITAMIN) capsule Take 1 capsule by mouth daily.   Yes Historical Provider, MD  aspirin 81 MG chewable tablet Chew  81 mg by mouth daily.    Historical Provider, MD  clomiPHENE (CLOMID) 50 MG tablet Take 50 mg by mouth 2 (two) times daily.    Historical Provider, MD  hydrocortisone (ANUSOL-HC) 2.5 % rectal cream Place 1 application rectally 3 (three) times daily. 12/04/16 12/11/16  Lucia Estelle, NP  omeprazole (PRILOSEC OTC) 20 MG tablet Take 20 mg by mouth daily.    Historical Provider, MD  oxyCODONE-acetaminophen (PERCOCET/ROXICET) 5-325 MG per tablet Take 1-2 tablets by mouth every 4 (four) hours as needed for severe pain (moderate - severe pain). 03/22/14   Levi Aland, MD  Prenatal Vit-Fe Fumarate-FA (PRENATAL MULTIVITAMIN) TABS tablet Take 1 tablet by mouth  daily at 12 noon.    Historical Provider, MD  pseudoephedrine-guaifenesin (MUCINEX D) 60-600 MG per tablet Take 2 tablets by mouth every 12 (twelve) hours.    Historical Provider, MD  VITAMIN E PO Take by mouth daily.    Historical Provider, MD   Meds Ordered and Administered this Visit   Medications  ketorolac (TORADOL) injection 60 mg (60 mg Intramuscular Given 12/04/16 1332)  ondansetron (ZOFRAN-ODT) disintegrating tablet 8 mg (8 mg Oral Given 12/04/16 1332)    BP 122/80 (BP Location: Left Arm)   Pulse 92   Temp 98.9 F (37.2 C) (Oral)   Resp 16   LMP 11/15/2016 (Exact Date)   SpO2 99%  No data found.   Physical Exam  Constitutional: She is oriented to person, place, and time. She appears well-developed and well-nourished.  Laying down on exam table with sunglasses on  HENT:  Head: Normocephalic and atraumatic.  Cardiovascular: Normal rate, regular rhythm and normal heart sounds.   Pulmonary/Chest: Effort normal and breath sounds normal. No respiratory distress. She has no wheezes.  Abdominal: Soft. Bowel sounds are normal. She exhibits no distension. There is no tenderness.  Genitourinary:  Genitourinary Comments: External hemorrhoids are present that are inflamed and firm; very tender to palpate  Musculoskeletal: Normal range of motion.  Neurological: She is alert and oriented to person, place, and time. No cranial nerve deficit. Coordination normal.  Skin: Skin is warm.  Psychiatric: She has a normal mood and affect.  Nursing note and vitals reviewed.   Urgent Care Course     Procedures (including critical care time)  Labs Review Labs Reviewed - No data to display  Imaging Review No results found.   MDM   1. External hemorrhoids    RX for hydrocortisone 2.5 % given. Witch Hazel wipes OTC may also help with symptoms. Avoid constipation by drinking plenty of water.  Reviewed directions for usage and side effects. Patient states understanding and will call with  questions or problems. Patient instructed to call or follow up with his/her primary care doctor if failure to improve or change in symptoms. Discharge instruction given.      Lucia Estelle, NP 12/04/16 1942

## 2016-12-04 NOTE — ED Triage Notes (Signed)
Patient presents to [facility] with bloody stools and rectal bleeding for several days, pt denies any injuries, pt states she thinks she has a hemorroid

## 2016-12-05 ENCOUNTER — Emergency Department (HOSPITAL_COMMUNITY): Payer: 59

## 2016-12-05 ENCOUNTER — Emergency Department (HOSPITAL_COMMUNITY)
Admission: EM | Admit: 2016-12-05 | Discharge: 2016-12-05 | Disposition: A | Discharge status: Home / Self Care | Payer: 59 | Attending: Emergency Medicine | Admitting: Emergency Medicine

## 2016-12-05 ENCOUNTER — Encounter (HOSPITAL_COMMUNITY): Payer: Self-pay

## 2016-12-05 DIAGNOSIS — R1011 Right upper quadrant pain: Secondary | ICD-10-CM | POA: Diagnosis present

## 2016-12-05 DIAGNOSIS — R1030 Lower abdominal pain, unspecified: Secondary | ICD-10-CM | POA: Diagnosis not present

## 2016-12-05 DIAGNOSIS — R112 Nausea with vomiting, unspecified: Secondary | ICD-10-CM

## 2016-12-05 DIAGNOSIS — R935 Abnormal findings on diagnostic imaging of other abdominal regions, including retroperitoneum: Secondary | ICD-10-CM | POA: Insufficient documentation

## 2016-12-05 DIAGNOSIS — R9389 Abnormal findings on diagnostic imaging of other specified body structures: Secondary | ICD-10-CM

## 2016-12-05 LAB — COMPREHENSIVE METABOLIC PANEL
ALK PHOS: 69 U/L (ref 38–126)
ALT: 19 U/L (ref 14–54)
AST: 24 U/L (ref 15–41)
Albumin: 3.5 g/dL (ref 3.5–5.0)
Anion gap: 11 (ref 5–15)
BILIRUBIN TOTAL: 0.5 mg/dL (ref 0.3–1.2)
BUN: 6 mg/dL (ref 6–20)
CO2: 24 mmol/L (ref 22–32)
Calcium: 9.6 mg/dL (ref 8.9–10.3)
Chloride: 102 mmol/L (ref 101–111)
Creatinine, Ser: 0.75 mg/dL (ref 0.44–1.00)
GFR calc non Af Amer: >60 mL/min (ref >60)
GLUCOSE: 95 mg/dL (ref 65–99)
Potassium: 4.1 mmol/L (ref 3.5–5.1)
Sodium: 137 mmol/L (ref 135–145)
TOTAL PROTEIN: 7.1 g/dL (ref 6.5–8.1)

## 2016-12-05 LAB — POC URINE PREG, ED: PREG TEST UR: NEGATIVE

## 2016-12-05 LAB — URINALYSIS, ROUTINE W REFLEX MICROSCOPIC
BILIRUBIN URINE: NEGATIVE
Glucose, UA: NEGATIVE mg/dL
Hgb urine dipstick: NEGATIVE
Ketones, ur: NEGATIVE mg/dL
Leukocytes, UA: NEGATIVE
Nitrite: NEGATIVE
Protein, ur: NEGATIVE mg/dL
SPECIFIC GRAVITY, URINE: 1.006 (ref 1.005–1.030)
pH: 6 (ref 5.0–8.0)

## 2016-12-05 LAB — CBC
HCT: 37.1 % (ref 36.0–46.0)
Hemoglobin: 11.7 g/dL — ABNORMAL LOW (ref 12.0–15.0)
MCH: 27 pg (ref 26.0–34.0)
MCHC: 31.5 g/dL (ref 30.0–36.0)
MCV: 85.5 fL (ref 78.0–100.0)
Platelets: 500 10*3/uL — ABNORMAL HIGH (ref 150–400)
RBC: 4.34 MIL/uL (ref 3.87–5.11)
RDW: 14.7 % (ref 11.5–15.5)
WBC: 12.8 10*3/uL — ABNORMAL HIGH (ref 4.0–10.5)

## 2016-12-05 LAB — LIPASE, BLOOD: Lipase: 16 U/L (ref 11–51)

## 2016-12-05 MED ORDER — DICYCLOMINE HCL 20 MG PO TABS: 20.0000 mg | ORAL_TABLET | Freq: Two times a day (BID) | ORAL | 0 refills | Status: DC

## 2016-12-05 MED ORDER — OXYCODONE-ACETAMINOPHEN 5-325 MG PO TABS
1.0000 | ORAL_TABLET | Freq: Once | ORAL | Status: AC
  Administered 2016-12-05: 1 via ORAL
  Filled 2016-12-05: qty 1

## 2016-12-05 MED ORDER — FENTANYL CITRATE (PF) 100 MCG/2ML IJ SOLN
50.0000 ug | Freq: Once | INTRAMUSCULAR | Status: AC
  Administered 2016-12-05: 50 ug via INTRAVENOUS
  Filled 2016-12-05: qty 2

## 2016-12-05 MED ORDER — SODIUM CHLORIDE 0.9 % IV BOLUS (SEPSIS)
1000.0000 mL | Freq: Once | INTRAVENOUS | Status: AC
  Administered 2016-12-05: 1000 mL via INTRAVENOUS

## 2016-12-05 MED ORDER — IOPAMIDOL (ISOVUE-300) INJECTION 61%
INTRAVENOUS | Status: AC
Start: 2016-12-05 — End: 2016-12-05
  Administered 2016-12-05: 100 mL
  Filled 2016-12-05: qty 100

## 2016-12-05 MED ORDER — ONDANSETRON HCL 4 MG/2ML IJ SOLN
4.0000 mg | Freq: Once | INTRAMUSCULAR | Status: AC
  Administered 2016-12-05: 4 mg via INTRAVENOUS
  Filled 2016-12-05: qty 2

## 2016-12-05 MED ORDER — PANTOPRAZOLE SODIUM 20 MG PO TBEC: 20.0000 mg | DELAYED_RELEASE_TABLET | Freq: Every day | ORAL | 0 refills | Status: DC

## 2016-12-05 NOTE — ED Notes (Signed)
On way to US 

## 2016-12-05 NOTE — ED Notes (Signed)
Family notified that [place] will not be before 2 hours.

## 2016-12-05 NOTE — ED Notes (Signed)
Patient transported to CT 

## 2016-12-05 NOTE — ED Triage Notes (Signed)
Pt complaining of R lower abdominal pain x 1 month. Pt states increased pain this week. Pt states N/V/D, 1 episode of emesis today. 5 episodes of diarrhea. Pt denies any urinary symptoms. States dx with hemorrhoids yesterday.

## 2016-12-05 NOTE — ED Provider Notes (Signed)
[facility]-EMERGENCY DEPT Provider Note   CSN: [id number] Arrival date & time: 12/05/16  4782   By signing my name below, I, Soijett Blue, attest that this documentation has been prepared under the direction and in the presence of Devoria Albe, MD. Electronically Signed: Soijett Blue, ED Scribe. 12/05/16. 3:56 AM.   Time seen 03:48 AM  History   Chief Complaint Chief Complaint  Patient presents with  . Abdominal Pain    HPI Sheryl [name] is a 46 y.o. female who presents to the Emergency Department complaining of intermittent, right upper abdominal pain onset 1.5 months worsening 2 hours ago at 1 am, waking her up from sleep. Pt reports associated nausea, vomiting x 1 episode, diarrhea, abdominal bloating, pain radiating into her back, fever (TMAX 100.1) x last night, and chills x last night. Pt was given a toradol injection by the Urgent Care on yesterday with no relief of her symptoms.  Pt states that her right  abdominal pain is worsened following eating usually 1-2 hours later and she ate a granola bar prior to her symptoms tonight around [place]. She denies cough, SOB, and any other symptoms. Pt PCP is Dr. Tiburcio Pea at [facility]. Pt denies smoking cigarettes, ETOH use, and works for [employer].   Per pt chart review: Pt was seen at [facility] on 12/04/2016 for rectal bleeding. Pt was diagnosed with hemorrhoids and Rx hydrocortisone 2.5% cream for the relief of her symptoms.  The history is provided by the patient. No language interpreter was used.   PCP [name], MD   Past Medical History:  Diagnosis Date  . Allergic rhinitis   . Chronic migraine   . Migraines   . Miscarriage 08/1996, 12/1996   x2    Patient Active Problem List   Diagnosis Date Noted  . Postoperative state 03/20/2014  . Normal labor 03/19/2014  . Atypical chest pain 01/07/2012    Past Surgical History:  Procedure Laterality Date  . BREAST SURGERY     cyst removed from left breast;  benign  . CESAREAN SECTION N/A 03/20/2014   Procedure: CESAREAN SECTION;  Surgeon: Loney Laurence, MD;  Location: [facility];  Service: Obstetrics;  Laterality: N/A;  . cyst removed from L breast  10/2006   benign  . DILATION AND CURETTAGE OF UTERUS  08/1996, 12/1996   x 2  . DILATION AND CURETTAGE OF UTERUS     x2  . live births  10/07/97, 02/08/2000   x2  . TUBAL LIGATION Bilateral 03/20/2014   Procedure: BILATERAL TUBAL LIGATION;  Surgeon: Loney Laurence, MD;  Location: [facility];  Service: Obstetrics;  Laterality: Bilateral;  . UNILATERAL SALPINGECTOMY Right 03/20/2014   Procedure: UNILATERAL SALPINGECTOMY;  Surgeon: Loney Laurence, MD;  Location: [facility];  Service: Obstetrics;  Laterality: Right;    OB History    Gravida Para Term Preterm AB Living   SAB TAB Ectopic Multiple Live Births   2 0 0   1       Home Medications    Prior to Admission medications   Medication Sig Start Date End Date Taking? Authorizing Provider  cetirizine (ZYRTEC) 10 MG tablet Take 10 mg by mouth daily as needed for allergies.   Yes Historical Provider, MD  cholecalciferol (VITAMIN D) 1000 units tablet Take 1,000 Units by mouth daily.   Yes Historical Provider, MD  eletriptan (RELPAX) 40 MG tablet Take 40 mg by mouth every  2 (two) hours as needed for migraine or headache. May repeat in 2 hours if headache persists or recurs.   Yes Historical Provider, MD  HYDROmorphone (DILAUDID) 2 MG tablet Take 2 mg by mouth every 4 (four) hours as needed for severe pain.   Yes Historical Provider, MD  Multiple Vitamin (MULTIVITAMIN) capsule Take 1 capsule by mouth daily.   Yes Historical Provider, MD    Family History Family History  Problem Relation Age of Onset  . Asthma Son   . Heart disease Maternal Grandfather   . Cancer Paternal Grandmother     unsure what kind    Social History Social History  Substance Use Topics  . Smoking status: Never Smoker  . Smokeless tobacco: Never Used  .  Alcohol use No  employed   Allergies   Advil [ibuprofen] and Salicylates   Review of Systems Review of Systems  Constitutional: Positive for chills and fever.  Respiratory: Positive for cough and shortness of breath.   Gastrointestinal: Positive for abdominal pain, diarrhea, nausea and vomiting.  All other systems reviewed and are negative.   Physical Exam Updated Vital Signs BP 113/70   Pulse 93   Temp 98.8 F (37.1 C) (Oral)   Resp (!) 22   Ht  (1.702 m)   Wt 275 lb (124.7 kg)   LMP 11/15/2016 (Exact Date)   SpO2 100%   BMI 43.07 kg/m   Vital signs normal    Physical Exam  Constitutional: She is oriented to person, place, and time. She appears well-developed and well-nourished.  Non-toxic appearance. She does not appear ill. No distress.  HENT:  Head: Normocephalic and atraumatic.  Right Ear: External ear normal.  Left Ear: External ear normal.  Nose: Nose normal. No mucosal edema or rhinorrhea.  Mouth/Throat: Oropharynx is clear and moist and mucous membranes are normal. No dental abscesses or uvula swelling.  Eyes: Conjunctivae and EOM are normal. Pupils are equal, round, and reactive to light.  Neck: Normal range of motion and full passive range of motion without pain. Neck supple.  Cardiovascular: Normal rate, regular rhythm and normal heart sounds.  Exam reveals no gallop and no friction rub.   No murmur heard. Pulmonary/Chest: Effort normal and breath sounds normal. No respiratory distress. She has no wheezes. She has no rhonchi. She has no rales. She exhibits no tenderness and no crepitus.  Abdominal: Soft. Normal appearance and bowel sounds are normal. She exhibits no distension. There is tenderness in the right upper quadrant. There is no rebound and no guarding.    Very tender to RUQ.   Musculoskeletal: Normal range of motion. She exhibits no edema or tenderness.  Moves all extremities well.   Neurological: She is alert and oriented to person,  place, and time. She has normal strength. No cranial nerve deficit.  Skin: Skin is warm, dry and intact. No rash noted. No erythema. No pallor.  Psychiatric: She has a normal mood and affect. Her speech is normal and behavior is normal. Her mood appears not anxious.  Nursing note and vitals reviewed.    ED Treatments / Results  Labs (all labs ordered are listed, but only abnormal results are displayed) Results for orders placed or performed during the hospital encounter of 12/05/16  Lipase, blood  Result Value Ref Range   Lipase 16 11 - 51 U/L  Comprehensive metabolic panel  Result Value Ref Range   Sodium 137 135 - 145 mmol/L   Potassium 4.1 3.5 - 5.1 mmol/L  Chloride 102 101 - 111 mmol/L   CO2 24 22 - 32 mmol/L   Glucose, Bld 95 65 - 99 mg/dL   BUN 6 6 - 20 mg/dL   Creatinine, Ser 1.61 0.44 - 1.00 mg/dL   Calcium 9.6 8.9 - 09.6 mg/dL   Total Protein 7.1 6.5 - 8.1 g/dL   Albumin 3.5 3.5 - 5.0 g/dL   AST 24 15 - 41 U/L   ALT 19 14 - 54 U/L   Alkaline Phosphatase 69 38 - 126 U/L   Total Bilirubin 0.5 0.3 - 1.2 mg/dL   GFR calc non Af Amer >60 >60 mL/min   GFR calc Af Amer >60 >60 mL/min   Anion gap 11 5 - 15  CBC  Result Value Ref Range   WBC 12.8 (H) 4.0 - 10.5 K/uL   RBC 4.34 3.87 - 5.11 MIL/uL   Hemoglobin 11.7 (L) 12.0 - 15.0 g/dL   HCT 04.5 40.9 - 81.1 %   MCV 85.5 78.0 - 100.0 fL   MCH 27.0 26.0 - 34.0 pg   MCHC 31.5 30.0 - 36.0 g/dL   RDW 91.4 78.2 - 95.6 %   Platelets 500 (H) 150 - 400 K/uL  Urinalysis, Routine w reflex microscopic  Result Value Ref Range   Color, Urine YELLOW YELLOW   APPearance CLOUDY (A) CLEAR   Specific Gravity, Urine 1.006 1.005 - 1.030   pH 6.0 5.0 - 8.0   Glucose, UA NEGATIVE NEGATIVE mg/dL   Hgb urine dipstick NEGATIVE NEGATIVE   Bilirubin Urine NEGATIVE NEGATIVE   Ketones, ur NEGATIVE NEGATIVE mg/dL   Protein, ur NEGATIVE NEGATIVE mg/dL   Nitrite NEGATIVE NEGATIVE   Leukocytes, UA NEGATIVE NEGATIVE  POC urine preg, ED  Result  Value Ref Range   Preg Test, Ur NEGATIVE NEGATIVE   Laboratory interpretation all normal except leukocytosis    EKG  EKG Interpretation None       Radiology US Abdomen Limited Ruq  Result Date: 12/05/2016 CLINICAL DATA:  Right upper quadrant pain related to eating for 6 weeks. EXAM: US ABDOMEN LIMITED - RIGHT UPPER QUADRANT COMPARISON:  None. FINDINGS: Gallbladder: Physiologically distended. No gallstones or wall thickening visualized. No sonographic Murphy sign noted by sonographer. Common bile duct: Diameter: 3.7 mm, normal. Liver: No focal lesion identified. Heterogeneous and increased in parenchymal echogenicity. Normal directional flow in the main portal vein. Examination is technically challenging and limited due to body habitus. IMPRESSION: 1. No gallstones or biliary dilatation. No evidence of gallbladder inflammation. 2. Hepatic steatosis. Electronically Signed   By: Rubye Oaks M.D.   On: 12/05/2016 05:37    Procedures Procedures (including critical care time)  Medications Ordered in ED Medications  sodium chloride 0.9 % bolus 1,000 mL (0 mLs Intravenous Stopped 12/05/16 0536)  fentaNYL (SUBLIMAZE) injection 50 mcg (50 mcg Intravenous Given 12/05/16 0437)  ondansetron (ZOFRAN) injection 4 mg (4 mg Intravenous Given 12/05/16 0437)  iopamidol (ISOVUE-300) 61 % injection (100 mLs  Contrast Given 12/05/16 0708)     Initial Impression / Assessment and Plan / ED Course  I have reviewed the triage vital signs and the nursing notes.  Pertinent labs & imaging results that were available during my care of the patient were reviewed by me and considered in my medical decision making (see chart for details).   DIAGNOSTIC STUDIES: Oxygen Saturation is 100% on RA, nl by my interpretation.    COORDINATION OF CARE: 3:55 AM Discussed her laboratory test results and treatment plan with pt  at bedside which includes, US abdomen limited RUQ, and pt agreed to plan.  PT is having RUQ pain  about 1 -2 hours after eating any type of food with abdominal bloating and pain radiating into her back. Gallstones sounds like the most likely etiology.   Recheck at 06:10 AM pt states her pain was gone, but is sore now after having the US probe pushed into her abdomen (pain level 3). Discussed her [place] is normal, will proceed with AP CT scan. She may have some colitis, or other GI problem causing her pain. If her CT scan is normal she may need to be on a PPI and GI referral to make sure she doesn't have a duodenal ulcer.   Pt turned over to Dr Corlis Leak at change of shift to get results of her AP CT scan  Final Clinical Impressions(s) / ED Diagnoses   Final diagnoses:  RUQ pain  Non-intractable vomiting with nausea, unspecified vomiting type    Disposition pending   Devoria Albe, MD, FACEP  I personally performed the services described in this documentation, which was scribed in my presence. The recorded information has been reviewed and considered.  Devoria Albe, MD, Concha Pyo, MD 12/05/16 [phone]

## 2016-12-05 NOTE — ED Notes (Signed)
Pt unable to void at this time. 

## 2016-12-05 NOTE — Discharge Instructions (Addendum)
We are unsure what caused your pain today. You had a RUQ [place] , a transvaginal US, labs, and a CT all of which are normal.  This may by spasm pain, we have given you something for this today. We are also treating your for Ulcer disease- duodenal, and have given you follow up with gastroenterogloy.  Take this acid supressor for two weeks to see if it helps promote healing and help your symptoms.

## 2016-12-17 ENCOUNTER — Other Ambulatory Visit: Payer: Self-pay | Admitting: Obstetrics and Gynecology

## 2016-12-20 LAB — CYTOLOGY - PAP

## 2017-04-20 ENCOUNTER — Other Ambulatory Visit: Payer: Self-pay | Admitting: Podiatry

## 2017-04-20 ENCOUNTER — Ambulatory Visit (INDEPENDENT_AMBULATORY_CARE_PROVIDER_SITE_OTHER): Payer: 59

## 2017-04-20 ENCOUNTER — Encounter: Payer: Self-pay | Admitting: Podiatry

## 2017-04-20 ENCOUNTER — Ambulatory Visit (INDEPENDENT_AMBULATORY_CARE_PROVIDER_SITE_OTHER): Payer: 59 | Admitting: Podiatry

## 2017-04-20 VITALS — BP 122/83 | HR 84

## 2017-04-20 DIAGNOSIS — M779 Enthesopathy, unspecified: Secondary | ICD-10-CM | POA: Diagnosis not present

## 2017-04-20 DIAGNOSIS — M79672 Pain in left foot: Secondary | ICD-10-CM

## 2017-04-20 DIAGNOSIS — M7750 Other enthesopathy of unspecified foot: Secondary | ICD-10-CM | POA: Diagnosis not present

## 2017-04-20 MED ORDER — TRIAMCINOLONE ACETONIDE 10 MG/ML IJ SUSP
10.0000 mg | Freq: Once | INTRAMUSCULAR | Status: AC
  Administered 2017-04-20: 10 mg

## 2017-04-20 NOTE — Progress Notes (Signed)
   Subjective:    Patient ID: Sheryl [name], female    DOB: 01/19/1971, 46 y.o.   MRN: [id number]  HPI  Chief Complaint  Patient presents with  . Foot Pain    Lt foot primarily 3rd 4th and 5th toe and just below on bottom X5 mos       Review of Systems  Musculoskeletal: Positive for arthralgias and gait problem.  Neurological: Positive for headaches.       Objective:   Physical Exam        Assessment & Plan:

## 2017-04-21 NOTE — Progress Notes (Signed)
Subjective:    Patient ID: Sheryl [name], female   DOB: 46 y.o.   MRN: [id number]   HPI patient presents stating I have had some discomfort in my left third metatarsal joint that has been bothering me for close to a year. I've also had swelling associated with it and I did go to other physicians who do not know the problem    Review of Systems  All other systems reviewed and are negative.       Objective:  Physical Exam  Cardiovascular: Intact distal pulses.   Musculoskeletal: Normal range of motion.  Neurological: She is alert.  Skin: Skin is warm.  Nursing note and vitals reviewed.  neurovascular status intact muscle strength adequate range of motion within normal limits with patient found to have inflammatory changes with pain third MPJ left with fluid buildup around the joint surface and also mild forefoot edema with obesity as a complicating factor. Patient's found have good digital perfusion and is well oriented 3     Assessment:   Inflammatory capsulitis third MPJ left most likely with possibility for systemic inflammation neuroma or stress fracture cannot be ruled out      Plan:    H&P and condition and x-rays reviewed with patient. I went ahead did a proximal nerve block I then aspirated the third MPJ getting out of small amount of clear fluid and injected with half cc deck Smith some Kenalog and applied thick plantar pad to reduce pressure against the joint surface. Instructed on reduced activity and reappoint 3 weeks  X-ray was negative for signs of stress fracture or arthritic issue

## 2017-05-11 ENCOUNTER — Ambulatory Visit (INDEPENDENT_AMBULATORY_CARE_PROVIDER_SITE_OTHER): Payer: 59 | Admitting: Podiatry

## 2017-05-11 ENCOUNTER — Encounter: Payer: Self-pay | Admitting: Podiatry

## 2017-05-11 DIAGNOSIS — M779 Enthesopathy, unspecified: Secondary | ICD-10-CM

## 2017-05-12 NOTE — Progress Notes (Signed)
Subjective:    Patient ID: Sheryl [name], female   DOB: 46 y.o.   MRN: [id number]   HPI patient states her left foot is significantly improved    ROS      Objective:  Physical Exam neurovascular status found to be intact with patient's left foot improved with mild discomfort upon deep palpation     Assessment:   Doing much better from inflammatory capsulitis of the left third MPJ     Plan:    Advised on physical therapy rigid bottom shoes and discussed the possibility for long-term orthotic treatment to reduce stress against the joint surface

## 2017-05-16 ENCOUNTER — Other Ambulatory Visit: Payer: Self-pay | Admitting: Obstetrics and Gynecology

## 2017-05-17 NOTE — H&P (Signed)
46 y.o. yo complains of DUB, menorrhagia,  Previously:Now skipping periods but having heavy periods when happens. No inter-menstrual bleeding. Abdominal pain- US done in April was uterine cyst 1 cm; no fibroids; 10x7x9, EM 2 cm; normal ovaries.\cb3 GI found polyp - precancer but no other cause of bleeding or pain. Periods have been excessive - just pads but changing every 45 minutes.\cb3 Has had 5 periods straight- previously was every 3rd one. D/w pt options of BTL and Novasure vs Robo/TLH/Salpingectomies/possible BSO/cysto. Excessive bleeding, thickened EM, abdominal pain and oligo.  Borderline anticardiolipin antibody. Lovenox postoperative. Will need EMB. Refill Iron.SUI - will leak once a week to every two weeks.".  EMB was benign. SUI not bad enough to warrant TVT at this point/  Past Medical History:  Diagnosis Date  . Allergic rhinitis   . Chronic migraine   . Migraines   . Miscarriage 08/1996, 12/1996   x2   Past Surgical History:  Procedure Laterality Date  . BREAST SURGERY     cyst removed from left breast; benign  . CESAREAN SECTION N/A 03/20/2014   Procedure: CESAREAN SECTION;  Surgeon: Loney Laurence, MD;  Location: [facility];  Service: Obstetrics;  Laterality: N/A;  . cyst removed from L breast  10/2006   benign  . DILATION AND CURETTAGE OF UTERUS  08/1996, 12/1996   x 2  . DILATION AND CURETTAGE OF UTERUS     x2  . live births  10/07/97, 02/08/2000   x2  . TUBAL LIGATION Bilateral 03/20/2014   Procedure: BILATERAL TUBAL LIGATION;  Surgeon: Loney Laurence, MD;  Location: [facility];  Service: Obstetrics;  Laterality: Bilateral;  . UNILATERAL SALPINGECTOMY Right 03/20/2014   Procedure: UNILATERAL SALPINGECTOMY;  Surgeon: Loney Laurence, MD;  Location: [facility];  Service: Obstetrics;  Laterality: Right;    Social History   Social History  . Marital status: Married    Spouse name: now engaged  . Number of children: 2  . Years of education: N/A   Occupational History  .  Production designer, theatre/television/film. [employer]   Social History Main Topics  . Smoking status: Never Smoker  . Smokeless tobacco: Never Used  . Alcohol use No  . Drug use: No  . Sexual activity: Yes    Birth control/ protection: None     Comment: two days ago   Other Topics Concern  . Not on file   Social History Narrative   ** Merged History Encounter **        No current facility-administered medications on file prior to encounter.    Current Outpatient Prescriptions on File Prior to Encounter  Medication Sig Dispense Refill  . cetirizine (ZYRTEC) 10 MG tablet Take 10 mg by mouth daily as needed for allergies.    . cholecalciferol (VITAMIN D) 1000 units tablet Take 1,000 Units by mouth daily.    Marland Kitchen eletriptan (RELPAX) 40 MG tablet Take 40 mg by mouth every 2 (two) hours as needed for migraine or headache. May repeat in 2 hours if headache persists or recurs.    . Multiple Vitamin (MULTIVITAMIN) capsule Take 1 capsule by mouth daily.      Allergies  Allergen Reactions  . Advil [Ibuprofen] Anaphylaxis and Hives  . Salicylates Other (See Comments)    unknown    @  Lungs: clear to ascultation Cor:  RRR Abdomen:  soft, nontender, nondistended. Ex:  no cords, erythema Pelvic:   . Vulva: no masses, no atrophy, no lesions\ls1   .  Vagina: no tenderness, no erythema, no abnormal vaginal discharge, no vesicle(s) or ulcers, no cystocele, no rectocele\ls1   . Cervix: grossly normal, no discharge, no cervical motion tenderness, sample taken for a Pap smear\ls1   . Uterus: normal size (7), normal shape, midline, no uterine prolapse, non-tender\ls1   . Bladder/Urethra: normal meatus, no urethral discharge, no urethral mass, bladder non distended, Urethra well supported\ls1   . Adnexa/Parametria: no parametrial tenderness, no parametrial mass, no adnexal tenderness, no ovarian mass  A:  For Robotic TLH, salpingectomies, cysto, possible BSO.  Pt not needing TVT at  this time.   P: All risks, benefits and alternatives d/w patient and she desires to proceed.  Patient has undergone a modified bowel prep and will receive preop antibiotics and SCDs during the operation.     Haward Pope A

## 2017-05-18 NOTE — Patient Instructions (Signed)
Your procedure is scheduled on:  Wednesday, Sept. 26, 2018  [facility] at:  9:45 AM  Pick up the phone at the desk and dial [phone].  Call this number if you have problems the morning of surgery: [phone].  Remember: Do NOT eat food or drink after:  Midnight Tuesday  Take these medicines the morning of surgery with a SIP OF WATER:  None  Stop ALL herbal medications at this time  Do NOT smoke the day of surgery.  Do NOT wear jewelry (body piercing), metal hair clips/bobby pins, make-up, artifical eyelashes or nail polish. Do NOT wear lotions, powders, or perfumes.  You may wear deodorant. Do NOT shave for 48 hours prior to surgery. Do NOT bring valuables to the hospital. Contacts, dentures, or bridgework may not be worn into surgery.  Leave suitcase in car.  After surgery it may be brought to your room.  For patients admitted to the hospital, checkout time is 11:00 AM the day of discharge.  Bring a copy of your healthcare power of attorney and living will documents.

## 2017-05-19 ENCOUNTER — Encounter (HOSPITAL_COMMUNITY)
Admission: RE | Admit: 2017-05-19 | Discharge: 2017-05-19 | Disposition: A | Discharge status: Home / Self Care | Payer: 59 | Source: Ambulatory Visit | Attending: Obstetrics and Gynecology | Admitting: Obstetrics and Gynecology

## 2017-05-19 ENCOUNTER — Encounter (HOSPITAL_COMMUNITY): Payer: Self-pay

## 2017-05-19 DIAGNOSIS — Z01818 Encounter for other preprocedural examination: Secondary | ICD-10-CM | POA: Diagnosis not present

## 2017-05-19 DIAGNOSIS — N938 Other specified abnormal uterine and vaginal bleeding: Secondary | ICD-10-CM | POA: Diagnosis not present

## 2017-05-19 DIAGNOSIS — N920 Excessive and frequent menstruation with regular cycle: Secondary | ICD-10-CM | POA: Diagnosis not present

## 2017-05-19 HISTORY — DX: Other specified postprocedural states: Z98.890

## 2017-05-19 HISTORY — DX: Anemia, unspecified: D64.9

## 2017-05-19 HISTORY — DX: Vitamin D deficiency, unspecified: E55.9

## 2017-05-19 HISTORY — DX: Nausea with vomiting, unspecified: R11.2

## 2017-05-19 LAB — CBC
HEMATOCRIT: 38.3 % (ref 36.0–46.0)
HEMOGLOBIN: 12.4 g/dL (ref 12.0–15.0)
MCH: 27.3 pg (ref 26.0–34.0)
MCHC: 32.4 g/dL (ref 30.0–36.0)
MCV: 84.4 fL (ref 78.0–100.0)
Platelets: 466 10*3/uL — ABNORMAL HIGH (ref 150–400)
RBC: 4.54 MIL/uL (ref 3.87–5.11)
RDW: 15.3 % (ref 11.5–15.5)
WBC: 12.8 10*3/uL — AB (ref 4.0–10.5)

## 2017-05-19 LAB — TYPE AND SCREEN
ABO/RH(D): A POS
ANTIBODY SCREEN: NEGATIVE

## 2017-05-31 NOTE — Anesthesia Preprocedure Evaluation (Signed)
Anesthesia Evaluation  Patient identified by MRN, date of birth, ID band Patient awake    Reviewed: Allergy & Precautions, H&P , NPO status , Patient's Chart, lab work & pertinent test results, reviewed documented beta blocker date and time   History of Anesthesia Complications (+) PONV and history of anesthetic complications  Airway Mallampati: II  TM Distance: >3 FB Neck ROM: Full    Dental no notable dental hx.    Pulmonary neg pulmonary ROS,    Pulmonary exam normal breath sounds clear to auscultation       Cardiovascular negative cardio ROS Normal cardiovascular exam Rhythm:Regular Rate:Normal     Neuro/Psych  Headaches, negative psych ROS   GI/Hepatic Neg liver ROS, GERD  Medicated,  Endo/Other  Morbid obesity  Renal/GU negative Renal ROS     Musculoskeletal   Abdominal   Peds  Hematology Antiphospholipid antibody syndrome - has been on baby aspirin only   Anesthesia Other Findings Anaphylactic allergy to NSAIDS and Aspirin (but takes baby ASA daily)  Reproductive/Obstetrics (+) Pregnancy (fetal intolerance to labor --> C/S)                             Anesthesia Physical  Anesthesia Plan  ASA: III and emergent  Anesthesia Plan: General   Post-op Pain Management:    Induction: Intravenous  PONV Risk Score and Plan: 3 and Ondansetron, Dexamethasone, Midazolam, Propofol infusion, Scopolamine patch - Pre-op and Treatment may vary due to age or medical condition  Airway Management Planned: Oral ETT  Additional Equipment:   Intra-op Plan:   Post-operative Plan: Extubation in OR  Informed Consent: I have reviewed the patients History and Physical, chart, labs and discussed the procedure including the risks, benefits and alternatives for the proposed anesthesia with the patient or authorized representative who has indicated his/her understanding and acceptance.     Plan  Discussed with: Surgeon and CRNA  Anesthesia Plan Comments: (  )        Anesthesia Quick Evaluation

## 2017-06-01 ENCOUNTER — Ambulatory Visit (HOSPITAL_COMMUNITY): Payer: 59 | Admitting: Anesthesiology

## 2017-06-01 ENCOUNTER — Encounter (HOSPITAL_COMMUNITY)
Admission: AD | Disposition: A | Discharge status: Home / Self Care | Payer: Self-pay | Source: Ambulatory Visit | Attending: Obstetrics and Gynecology

## 2017-06-01 ENCOUNTER — Observation Stay (HOSPITAL_COMMUNITY)
Admission: AD | Admit: 2017-06-01 | Discharge: 2017-06-02 | Disposition: A | Discharge status: Home / Self Care | Payer: 59 | Source: Ambulatory Visit | Attending: Obstetrics and Gynecology | Admitting: Obstetrics and Gynecology

## 2017-06-01 ENCOUNTER — Encounter (HOSPITAL_COMMUNITY): Payer: Self-pay | Admitting: *Deleted

## 2017-06-01 DIAGNOSIS — N800 Endometriosis of uterus: Principal | ICD-10-CM | POA: Insufficient documentation

## 2017-06-01 DIAGNOSIS — N920 Excessive and frequent menstruation with regular cycle: Secondary | ICD-10-CM | POA: Diagnosis not present

## 2017-06-01 DIAGNOSIS — N393 Stress incontinence (female) (male): Secondary | ICD-10-CM | POA: Insufficient documentation

## 2017-06-01 DIAGNOSIS — Z6841 Body Mass Index (BMI) 40.0 and over, adult: Secondary | ICD-10-CM | POA: Insufficient documentation

## 2017-06-01 DIAGNOSIS — K429 Umbilical hernia without obstruction or gangrene: Secondary | ICD-10-CM | POA: Insufficient documentation

## 2017-06-01 DIAGNOSIS — K219 Gastro-esophageal reflux disease without esophagitis: Secondary | ICD-10-CM | POA: Diagnosis not present

## 2017-06-01 DIAGNOSIS — Z886 Allergy status to analgesic agent status: Secondary | ICD-10-CM | POA: Insufficient documentation

## 2017-06-01 DIAGNOSIS — Z79899 Other long term (current) drug therapy: Secondary | ICD-10-CM | POA: Diagnosis not present

## 2017-06-01 DIAGNOSIS — Z23 Encounter for immunization: Secondary | ICD-10-CM | POA: Insufficient documentation

## 2017-06-01 DIAGNOSIS — N938 Other specified abnormal uterine and vaginal bleeding: Secondary | ICD-10-CM | POA: Diagnosis present

## 2017-06-01 DIAGNOSIS — N7091 Salpingitis, unspecified: Secondary | ICD-10-CM | POA: Diagnosis not present

## 2017-06-01 DIAGNOSIS — Z9889 Other specified postprocedural states: Secondary | ICD-10-CM

## 2017-06-01 DIAGNOSIS — D6861 Antiphospholipid syndrome: Secondary | ICD-10-CM | POA: Insufficient documentation

## 2017-06-01 HISTORY — PX: ROBOTIC ASSISTED TOTAL HYSTERECTOMY WITH SALPINGECTOMY: SHX6679

## 2017-06-01 HISTORY — PX: CYSTOSCOPY: SHX5120

## 2017-06-01 LAB — CREATININE, SERUM
Creatinine, Ser: 0.74 mg/dL (ref 0.44–1.00)
GFR calc Af Amer: >60 mL/min (ref >60)
GFR calc non Af Amer: >60 mL/min (ref >60)

## 2017-06-01 LAB — PREGNANCY, URINE: PREG TEST UR: NEGATIVE

## 2017-06-01 LAB — CBC
HCT: 36.4 % (ref 36.0–46.0)
HEMOGLOBIN: 11.9 g/dL — AB (ref 12.0–15.0)
MCH: 27.5 pg (ref 26.0–34.0)
MCHC: 32.7 g/dL (ref 30.0–36.0)
MCV: 84.3 fL (ref 78.0–100.0)
PLATELETS: 419 10*3/uL — AB (ref 150–400)
RBC: 4.32 MIL/uL (ref 3.87–5.11)
RDW: 15.2 % (ref 11.5–15.5)
WBC: 15.6 10*3/uL — ABNORMAL HIGH (ref 4.0–10.5)

## 2017-06-01 SURGERY — ROBOTIC ASSISTED TOTAL HYSTERECTOMY WITH SALPINGECTOMY
Anesthesia: General | Site: Bladder

## 2017-06-01 MED ORDER — FENTANYL CITRATE (PF) 100 MCG/2ML IJ SOLN
25.0000 ug | INTRAMUSCULAR | Status: DC | PRN
  Administered 2017-06-01 (×3): 50 ug via INTRAVENOUS

## 2017-06-01 MED ORDER — MEPERIDINE HCL 25 MG/ML IJ SOLN: 6.2500 mg | INTRAMUSCULAR | Status: DC | PRN

## 2017-06-01 MED ORDER — ONDANSETRON HCL 4 MG/2ML IJ SOLN
4.0000 mg | Freq: Four times a day (QID) | INTRAMUSCULAR | Status: DC | PRN
  Administered 2017-06-01: 4 mg via INTRAVENOUS
  Filled 2017-06-01: qty 2

## 2017-06-01 MED ORDER — FENTANYL CITRATE (PF) 100 MCG/2ML IJ SOLN
INTRAMUSCULAR | Status: DC | PRN
  Administered 2017-06-01: 50 ug via INTRAVENOUS
  Administered 2017-06-01 (×2): 100 ug via INTRAVENOUS

## 2017-06-01 MED ORDER — ACETAMINOPHEN 10 MG/ML IV SOLN
1000.0000 mg | Freq: Once | INTRAVENOUS | Status: AC
  Administered 2017-06-01: 1000 mg via INTRAVENOUS

## 2017-06-01 MED ORDER — ACETAMINOPHEN 10 MG/ML IV SOLN
INTRAVENOUS | Status: AC
  Administered 2017-06-01: 1000 mg via INTRAVENOUS
  Filled 2017-06-01: qty 100

## 2017-06-01 MED ORDER — LACTATED RINGERS IV BOLUS (SEPSIS): 500.0000 mL | Freq: Once | INTRAVENOUS | Status: DC

## 2017-06-01 MED ORDER — LIDOCAINE HCL (CARDIAC) 20 MG/ML IV SOLN
INTRAVENOUS | Status: AC
  Filled 2017-06-01: qty 5

## 2017-06-01 MED ORDER — ONDANSETRON HCL 4 MG/2ML IJ SOLN
INTRAMUSCULAR | Status: DC | PRN
  Administered 2017-06-01: 4 mg via INTRAVENOUS

## 2017-06-01 MED ORDER — ROCURONIUM BROMIDE 100 MG/10ML IV SOLN
INTRAVENOUS | Status: AC
  Filled 2017-06-01: qty 1

## 2017-06-01 MED ORDER — SUMATRIPTAN SUCCINATE 50 MG PO TABS
50.0000 mg | ORAL_TABLET | ORAL | Status: DC | PRN
  Administered 2017-06-01: 50 mg via ORAL
  Filled 2017-06-01 (×2): qty 1

## 2017-06-01 MED ORDER — LACTATED RINGERS IV SOLN
INTRAVENOUS | Status: DC
  Administered 2017-06-01 – 2017-06-02 (×2): via INTRAVENOUS

## 2017-06-01 MED ORDER — MIDAZOLAM HCL 5 MG/5ML IJ SOLN
INTRAMUSCULAR | Status: DC | PRN
  Administered 2017-06-01: 2 mg via INTRAVENOUS

## 2017-06-01 MED ORDER — ENOXAPARIN SODIUM 40 MG/0.4ML ~~LOC~~ SOLN
40.0000 mg | SUBCUTANEOUS | Status: DC
  Administered 2017-06-02: 40 mg via SUBCUTANEOUS
  Filled 2017-06-01 (×2): qty 0.4

## 2017-06-01 MED ORDER — LORATADINE 10 MG PO TABS
10.0000 mg | ORAL_TABLET | Freq: Every day | ORAL | Status: DC
  Administered 2017-06-02: 10 mg via ORAL
  Filled 2017-06-01 (×3): qty 1

## 2017-06-01 MED ORDER — HYDROMORPHONE HCL 1 MG/ML IJ SOLN
0.2500 mg | INTRAMUSCULAR | Status: DC | PRN
  Administered 2017-06-01: 0.5 mg via INTRAVENOUS

## 2017-06-01 MED ORDER — FENTANYL CITRATE (PF) 250 MCG/5ML IJ SOLN
INTRAMUSCULAR | Status: AC
  Filled 2017-06-01: qty 5

## 2017-06-01 MED ORDER — PROPOFOL 10 MG/ML IV BOLUS
INTRAVENOUS | Status: DC | PRN
  Administered 2017-06-01: 200 mg via INTRAVENOUS

## 2017-06-01 MED ORDER — ELETRIPTAN HYDROBROMIDE 40 MG PO TABS
40.0000 mg | ORAL_TABLET | ORAL | Status: DC | PRN
  Filled 2017-06-01 (×2): qty 1

## 2017-06-01 MED ORDER — ONDANSETRON HCL 4 MG/2ML IJ SOLN: 4.0000 mg | Freq: Once | INTRAMUSCULAR | Status: DC | PRN

## 2017-06-01 MED ORDER — SODIUM CHLORIDE 0.9 % IR SOLN
Status: DC | PRN
  Administered 2017-06-01: 3000 mL

## 2017-06-01 MED ORDER — FENTANYL CITRATE (PF) 100 MCG/2ML IJ SOLN
INTRAMUSCULAR | Status: AC
  Filled 2017-06-01: qty 2

## 2017-06-01 MED ORDER — STERILE WATER FOR IRRIGATION IR SOLN
Status: DC | PRN
  Administered 2017-06-01: 1000 mL

## 2017-06-01 MED ORDER — MIDAZOLAM HCL 2 MG/2ML IJ SOLN
INTRAMUSCULAR | Status: AC
  Filled 2017-06-01: qty 2

## 2017-06-01 MED ORDER — ROCURONIUM BROMIDE 100 MG/10ML IV SOLN
INTRAVENOUS | Status: DC | PRN
  Administered 2017-06-01: 10 mg via INTRAVENOUS
  Administered 2017-06-01: 60 mg via INTRAVENOUS
  Administered 2017-06-01: 10 mg via INTRAVENOUS

## 2017-06-01 MED ORDER — SUGAMMADEX SODIUM 500 MG/5ML IV SOLN
INTRAVENOUS | Status: AC
  Filled 2017-06-01: qty 5

## 2017-06-01 MED ORDER — DEXTROSE 5 % IV SOLN
3.0000 g | INTRAVENOUS | Status: AC
  Administered 2017-06-01: 3 g via INTRAVENOUS
  Filled 2017-06-01: qty 3000

## 2017-06-01 MED ORDER — HYDROMORPHONE HCL 1 MG/ML IJ SOLN
INTRAMUSCULAR | Status: AC
  Filled 2017-06-01: qty 1

## 2017-06-01 MED ORDER — SUGAMMADEX SODIUM 200 MG/2ML IV SOLN
INTRAVENOUS | Status: DC | PRN
  Administered 2017-06-01: 200 mg via INTRAVENOUS

## 2017-06-01 MED ORDER — HYDROMORPHONE HCL 1 MG/ML IJ SOLN
INTRAMUSCULAR | Status: AC
  Filled 2017-06-01: qty 0.5

## 2017-06-01 MED ORDER — LIDOCAINE HCL (CARDIAC) 20 MG/ML IV SOLN
INTRAVENOUS | Status: DC | PRN
  Administered 2017-06-01: 100 mg via INTRAVENOUS

## 2017-06-01 MED ORDER — DEXAMETHASONE SODIUM PHOSPHATE 10 MG/ML IJ SOLN
INTRAMUSCULAR | Status: AC
  Filled 2017-06-01: qty 1

## 2017-06-01 MED ORDER — ONDANSETRON HCL 4 MG/2ML IJ SOLN
INTRAMUSCULAR | Status: AC
  Filled 2017-06-01: qty 2

## 2017-06-01 MED ORDER — MENTHOL 3 MG MT LOZG: 1.0000 | LOZENGE | OROMUCOSAL | Status: DC | PRN

## 2017-06-01 MED ORDER — PROPOFOL 10 MG/ML IV BOLUS
INTRAVENOUS | Status: AC
  Filled 2017-06-01: qty 20

## 2017-06-01 MED ORDER — SODIUM CHLORIDE 0.9 % IJ SOLN
INTRAMUSCULAR | Status: AC
  Filled 2017-06-01: qty 50

## 2017-06-01 MED ORDER — SCOPOLAMINE 1 MG/3DAYS TD PT72
MEDICATED_PATCH | TRANSDERMAL | Status: AC
  Filled 2017-06-01: qty 1

## 2017-06-01 MED ORDER — SODIUM CHLORIDE 0.9 % IV SOLN
INTRAVENOUS | Status: DC | PRN
  Administered 2017-06-01: 60 mL

## 2017-06-01 MED ORDER — SCOPOLAMINE 1 MG/3DAYS TD PT72
1.0000 | MEDICATED_PATCH | Freq: Once | TRANSDERMAL | Status: DC
  Administered 2017-06-01: 1.5 mg via TRANSDERMAL

## 2017-06-01 MED ORDER — ROPIVACAINE HCL 5 MG/ML IJ SOLN
INTRAMUSCULAR | Status: AC
  Filled 2017-06-01: qty 30

## 2017-06-01 MED ORDER — PHENYLEPHRINE 40 MCG/ML (10ML) SYRINGE FOR IV PUSH (FOR BLOOD PRESSURE SUPPORT)
PREFILLED_SYRINGE | INTRAVENOUS | Status: AC
  Filled 2017-06-01: qty 10

## 2017-06-01 MED ORDER — DEXAMETHASONE SODIUM PHOSPHATE 10 MG/ML IJ SOLN
INTRAMUSCULAR | Status: DC | PRN
  Administered 2017-06-01: 10 mg via INTRAVENOUS

## 2017-06-01 MED ORDER — PHENYLEPHRINE HCL 10 MG/ML IJ SOLN
INTRAMUSCULAR | Status: DC | PRN
  Administered 2017-06-01: 80 ug via INTRAVENOUS

## 2017-06-01 MED ORDER — ARTIFICIAL TEARS OPHTHALMIC OINT
TOPICAL_OINTMENT | OPHTHALMIC | Status: AC
  Filled 2017-06-01: qty 3.5

## 2017-06-01 MED ORDER — ONDANSETRON HCL 4 MG PO TABS: 4.0000 mg | ORAL_TABLET | Freq: Four times a day (QID) | ORAL | Status: DC | PRN

## 2017-06-01 MED ORDER — HYDROMORPHONE HCL 1 MG/ML IJ SOLN
INTRAMUSCULAR | Status: DC | PRN
Start: 2017-06-01 — End: 2017-06-01
  Administered 2017-06-01 (×2): 0.5 mg via INTRAVENOUS

## 2017-06-01 MED ORDER — LACTATED RINGERS IV SOLN
INTRAVENOUS | Status: DC
  Administered 2017-06-01 (×4): via INTRAVENOUS

## 2017-06-01 MED ORDER — OXYCODONE-ACETAMINOPHEN 5-325 MG PO TABS
1.0000 | ORAL_TABLET | ORAL | Status: DC | PRN
  Administered 2017-06-01: 2 via ORAL
  Administered 2017-06-01: 1 via ORAL
  Administered 2017-06-02 (×3): 2 via ORAL
  Filled 2017-06-01: qty 2
  Filled 2017-06-01: qty 1
  Filled 2017-06-01 (×3): qty 2

## 2017-06-01 SURGICAL SUPPLY — 62 items
ADH SKN CLS APL DERMABOND .7 (GAUZE/BANDAGES/DRESSINGS) ×2
APL SRG 38 LTWT LNG FL B (MISCELLANEOUS) ×2
APPLICATOR ARISTA FLEXITIP XL (MISCELLANEOUS) ×4 IMPLANT
BARRIER ADHS 3X4 INTERCEED (GAUZE/BANDAGES/DRESSINGS) IMPLANT
BRR ADH 4X3 ABS CNTRL BYND (GAUZE/BANDAGES/DRESSINGS)
CANISTER SUCT 3000ML PPV (MISCELLANEOUS) ×4 IMPLANT
CATH FOLEY 2WAY SLVR  5CC 16FR (CATHETERS) ×2
CATH FOLEY 2WAY SLVR 5CC 16FR (CATHETERS) ×2 IMPLANT
CLOTH BEACON ORANGE TIMEOUT ST (SAFETY) ×4 IMPLANT
CONT PATH 16OZ SNAP LID 3702 (MISCELLANEOUS) ×4 IMPLANT
COVER BACK TABLE 60X90IN (DRAPES) ×8 IMPLANT
COVER TIP SHEARS 8 DVNC (MISCELLANEOUS) ×2 IMPLANT
COVER TIP SHEARS 8MM DA VINCI (MISCELLANEOUS) ×2
DECANTER SPIKE VIAL GLASS SM (MISCELLANEOUS) ×8 IMPLANT
DERMABOND ADVANCED (GAUZE/BANDAGES/DRESSINGS) ×2
DERMABOND ADVANCED .7 DNX12 (GAUZE/BANDAGES/DRESSINGS) ×2 IMPLANT
DURAPREP 26ML APPLICATOR (WOUND CARE) ×4 IMPLANT
ELECT REM PT RETURN 9FT ADLT (ELECTROSURGICAL) ×4
ELECTRODE REM PT RTRN 9FT ADLT (ELECTROSURGICAL) ×2 IMPLANT
GLOVE BIO SURGEON STRL SZ7 (GLOVE) ×12 IMPLANT
GLOVE BIOGEL PI IND STRL 7.0 (GLOVE) ×4 IMPLANT
GLOVE BIOGEL PI INDICATOR 7.0 (GLOVE) ×4
GOWN STRL REUS W/TWL LRG LVL3 (GOWN DISPOSABLE) ×8 IMPLANT
GRASPER BIPOLAR FEN DA VINCI (INSTRUMENTS)
GRASPER BPLR FEN DVNC (INSTRUMENTS) IMPLANT
HEMOSTAT ARISTA ABSORB 3G PWDR (MISCELLANEOUS) ×4 IMPLANT
KIT ACCESSORY DA VINCI DISP (KITS) ×2
KIT ACCESSORY DVNC DISP (KITS) ×2 IMPLANT
LEGGING LITHOTOMY PAIR STRL (DRAPES) ×4 IMPLANT
MANIPULATOR ADVINCU DEL 2.5 PL (MISCELLANEOUS) ×4 IMPLANT
MANIPULATOR ADVINCU DEL 3.0 PL (MISCELLANEOUS) IMPLANT
MANIPULATOR ADVINCU DEL 3.5 PL (MISCELLANEOUS) IMPLANT
MANIPULATOR ADVINCU DEL 4.0 PL (MISCELLANEOUS) IMPLANT
NEEDLE INSUFFLATION 120MM (ENDOMECHANICALS) ×4 IMPLANT
PACK ROBOT WH (CUSTOM PROCEDURE TRAY) ×4 IMPLANT
PACK ROBOTIC GOWN (GOWN DISPOSABLE) ×4 IMPLANT
PACK TRENDGUARD 450 HYBRID PRO (MISCELLANEOUS) ×2 IMPLANT
PACK TRENDGUARD 600 HYBRD PROC (MISCELLANEOUS) IMPLANT
PAD PREP 24X48 CUFFED NSTRL (MISCELLANEOUS) ×4 IMPLANT
POUCH LAPAROSCOPIC INSTRUMENT (MISCELLANEOUS) ×4 IMPLANT
PROTECTOR NERVE ULNAR (MISCELLANEOUS) ×8 IMPLANT
SET CYSTO W/LG BORE CLAMP LF (SET/KITS/TRAYS/PACK) ×4 IMPLANT
SET IRRIG TUBING LAPAROSCOPIC (IRRIGATION / IRRIGATOR) ×4 IMPLANT
SET TRI-LUMEN FLTR TB AIRSEAL (TUBING) ×4 IMPLANT
SUT DVC VLOC 180 0 12IN GS21 (SUTURE)
SUT VIC AB 2-0 CT2 27 (SUTURE) ×8 IMPLANT
SUT VIC AB 2-0 UR6 27 (SUTURE) ×4 IMPLANT
SUT VICRYL RAPIDE 3 0 (SUTURE) ×8 IMPLANT
SUT VLOC 180 0 9IN  GS21 (SUTURE)
SUT VLOC 180 0 9IN GS21 (SUTURE) IMPLANT
SUTURE DVC VLC 180 0 12IN GS21 (SUTURE) IMPLANT
SYR 50ML LL SCALE MARK (SYRINGE) ×4 IMPLANT
TIP RUMI ORANGE 6.7MMX12CM (TIP) IMPLANT
TOWEL OR 17X24 6PK STRL BLUE (TOWEL DISPOSABLE) ×12 IMPLANT
TRENDGUARD 450 HYBRID PRO PACK (MISCELLANEOUS) ×4
TRENDGUARD 600 HYBRID PROC PK (MISCELLANEOUS)
TROCAR DILATING TIP 12MM 150MM (ENDOMECHANICALS) ×4 IMPLANT
TROCAR DISP BLADELESS 8 DVNC (TROCAR) ×2 IMPLANT
TROCAR DISP BLADELESS 8MM (TROCAR) ×2
TROCAR PORT AIRSEAL 5X120 (TROCAR) ×4 IMPLANT
TROCAR PORT AIRSEAL 8X120 (TROCAR) IMPLANT
WATER STERILE IRR 1000ML POUR (IV SOLUTION) ×4 IMPLANT

## 2017-06-01 NOTE — Progress Notes (Signed)
There has been no change in the patients history, status or exam since the history and physical.  Vitals:   06/01/17 0925  BP: 124/82  Pulse: 87  Resp: 18  Temp: 98.2 F (36.8 C)  TempSrc: Oral  SpO2: 98%    Lab Results  Component Value Date   WBC 12.8 (H) 05/19/2017   HGB 12.4 05/19/2017   HCT 38.3 05/19/2017   MCV 84.4 05/19/2017   PLT 466 (H) 05/19/2017    Sheryl [name] A

## 2017-06-01 NOTE — Anesthesia Postprocedure Evaluation (Signed)
Anesthesia Post Note  Patient: Sheryl [name]  Procedure(s) Performed: Procedure(s) (LRB): ROBOTIC ASSISTED TOTAL HYSTERECTOMY WITH SALPINGECTOMY WITH PARTIAL REMOVAL OF HERNIA SAC (Bilateral) CYSTOSCOPY (N/A)     Patient location during evaluation: PACU Anesthesia Type: General Level of consciousness: awake and alert Pain management: pain level controlled Vital Signs Assessment: post-procedure vital signs reviewed and stable Respiratory status: spontaneous breathing, nonlabored ventilation and respiratory function stable Cardiovascular status: blood pressure returned to baseline and stable Postop Assessment: no apparent nausea or vomiting Anesthetic complications: no    Last Vitals:  Vitals:   06/01/17 1407 06/01/17 1415  BP: (!) 114/93 119/77  Pulse: 91 90  Resp: 15 14  Temp: 36.9 C   SpO2: 99% 100%    Last Pain:  Vitals:   06/01/17 1430  TempSrc:   PainSc: 6    Pain Goal: Patients Stated Pain Goal: 3 (06/01/17 1407)               Lowella Curb

## 2017-06-01 NOTE — Anesthesia Procedure Notes (Signed)
Procedure Name: Intubation Date/Time: 06/01/2017 11:54 AM Performed by: Junious Silk Pre-anesthesia Checklist: Patient identified, Emergency Drugs available, Suction available, Patient being monitored and Timeout performed Patient Re-evaluated:Patient Re-evaluated prior to induction Oxygen Delivery Method: Circle system utilized Preoxygenation: Pre-oxygenation with 100% oxygen Induction Type: IV induction Ventilation: Mask ventilation without difficulty and Oral airway inserted - appropriate to patient size Laryngoscope Size: Miller and 2 Grade View: Grade I Tube type: Oral Tube size: 7.0 mm Number of attempts: 1 Airway Equipment and Method: Stylet Placement Confirmation: ETT inserted through vocal cords under direct vision,  positive ETCO2,  CO2 detector and breath sounds checked- equal and bilateral Secured at: 22 cm Tube secured with: Tape Dental Injury: Teeth and Oropharynx as per pre-operative assessment

## 2017-06-01 NOTE — Transfer of Care (Signed)
Immediate Anesthesia Transfer of Care Note  Patient: Sheryl [name]  Procedure(s) Performed: Procedure(s): ROBOTIC ASSISTED TOTAL HYSTERECTOMY WITH SALPINGECTOMY WITH PARTIAL REMOVAL OF HERNIA SAC (Bilateral) CYSTOSCOPY (N/A)  Patient Location: PACU  Anesthesia Type:General  Level of Consciousness: awake, alert  and oriented  Airway & Oxygen Therapy: Patient Spontanous Breathing and Patient connected to nasal cannula oxygen  Post-op Assessment: Report given to RN and Post -op Vital signs reviewed and stable  Post vital signs: Reviewed and stable  Last Vitals:  Vitals:   06/01/17 0925  BP: 124/82  Pulse: 87  Resp: 18  Temp: 36.8 C  SpO2: 98%    Last Pain:  Vitals:   06/01/17 0925  TempSrc: Oral  PainSc: 3       Patients Stated Pain Goal: 3 (06/01/17 0925)  Complications: No apparent anesthesia complications

## 2017-06-01 NOTE — Op Note (Signed)
1:55 PM  PATIENT:  [name]  46 y.o. female  PRE-OPERATIVE DIAGNOSIS:  DUB / THICKENED ENDOMETRIUM / ABD PIAN / SUI  POST-OPERATIVE DIAGNOSIS:  DUB/ THICKENED ENDOMETRIUM/ABD PAIN/STRESS URINARY INCONTINENCE  PROCEDURE:  Procedure(s): ROBOTIC ASSISTED TOTAL HYSTERECTOMY WITH SALPINGECTOMY WITH PARTIAL REMOVAL OF HERNIA SAC (Bilateral) CYSTOSCOPY (N/A)  SURGEON:  Surgeon(s) and Role:    Carrington Clamp, MD - Primary    ASSISTANTS: Dr. Marlow Baars    ANESTHESIA:   general  EBL:  Total I/O In: 2000 [I.V.:2000] Out: 175 [Urine:100; Blood:75]  LOCAL MEDICATIONS USED:  OTHER Ropivicaine  SPECIMEN:  Source of Specimen:  uterus, cervix, tubes, filschie clips  DISPOSITION OF SPECIMEN:  PATHOLOGY  COUNTS:  YES  TOURNIQUET:  * No tourniquets in log *  DICTATION: .Note written in EPIC  PLAN OF CARE: Admit for overnight observation  PATIENT DISPOSITION:  PACU - hemodynamically stable.   Delay start of Pharmacological VTE agent (>24hrs) due to surgical blood loss or risk of bleeding: not applicable  Complications:  None.  Findings:  9 weeks size boggy uterus. Ovaries were normal appearing. The ureters were identified during multiple points of the case and were always out of the field of dissection.  On cystoscopy, the bladder was intact and bilateral spill was seen from each ureteral orriface.    Medications:  Ancef.  Bupivicaine.    Technique:  After adequate anesthesia was achieved the patient was positioned, prepped and draped in usual sterile fashion.  A speculum was placed in the vagina and the cervix dilated with pratt dilators.  The Advincula with the 3 cm Koh ring were assembled and placed in proper fashion.  The  Speculum was removed and the bladder catheterized with a foley.    Attention was turned to the abdomen where a 1 cm incision was made 1 cm above the umbilicus.  The veress needle was introduced without aspiration of bowel  contents or blood and the abdomen insufflated. The long 12 mm trocar was placed and the other three trocar sites were marked out, all approximately 10 cm from each other and the camera.  Two 8.5 mm trocars were placed on either side of the camera port and a 5 mm assistant port was placed 3 cm above the plane of the other two trocars.  All trocars were inserted under direct visualization of the camera.  The patient was placed in trendelenburg and then the Robot docked.  The PK forceps were placed on arm 2 and the Hot shears on arm 1 and introduced under direct visualization of the camera.  I then broke scrub and sat down at the console.  The above findings were noted and the ureters identified well out of the field of dissection.  The right fallopian tube was isolated and cauterized with the PK.  The Utero-ovarian ligament was then divided with the PK cautery and shears.  The posterior broad ligament was then divided with the hot shears until the uterosacral ligament.  The Broad and cardinal ligaments were then cauterized against the cervix to the level of the Koh ring, securing the uterine artery.  Each pedicle was then incised with the shears.  The anterior leaf was then incised at the reflection of the vessico-uterine junction and the lateral bladder retracted inferiorly after the round ligament had been divided with the PK forceps.  The left tube was cauterized with the PK and divided with the shears;  then the left utero-ovarian ligament divided with the PK forceps  and the scissors.  The round ligament was divided as well and the posterior leaf of the broad ligament then divided with the hot shears. The broad and cardinal ligaments were then cauterized on the left in the same way.   At the level of the internal os, the uterine arteries were bilaterally cauterized with the PK.  The ureters were identified well out of the field of dissection.  .   The bladder was then able to be retracted inferiorly and the  vesico-uterine fascia was incised in the midline until the bladder was removed one cm below the Koh ring.  The hot shears then circumferentially incised the vagina at the level of the reflection on the [facility] ring.  Once the uterus and cervix were amputated, cautery was used to insure hemostasis of the cuff.  Once hemostasis was achieved, the instruments were changed to the mega needle driver and mega suture cut needle driver and the cuff was closed with a running stitches of 0-vicryl V loc.   Cautery was used to ensure hemostasis of the left pedicles very superficially.  The ureters were peristalsing bilaterally well and very lateral to the areas of operation.    The Robot was then undocked and I scrubbed back in.  The needle was removed and Bupivicaine was introduced into the pelvis.  The fascia of the 12 mm trocar was closed with a figure of 8 stitch of 0 vicryl.  The skin incisions were closed with subcuticular stitches of 3-0 vicryl Rapide and Dermabond.  All instruments were removed from the vagina and cystoscopy performed, revealing an intact bladder and vigourous spill of urine from each ureteral orifice.  The cystoscope was removed and the patient taken to the recovery room in stable condition.  Laurissa Cowper A

## 2017-06-01 NOTE — Brief Op Note (Signed)
06/01/2017  1:55 PM  PATIENT:  Sheryl [name]  46 y.o. female  PRE-OPERATIVE DIAGNOSIS:  DUB / THICKENED ENDOMETRIUM / ABD PIAN / SUI  POST-OPERATIVE DIAGNOSIS:  DUB/ THICKENED ENDOMETRIUM/ABD PAIN/STRESS URINARY INCONTINENCE  PROCEDURE:  Procedure(s): ROBOTIC ASSISTED TOTAL HYSTERECTOMY WITH SALPINGECTOMY WITH PARTIAL REMOVAL OF HERNIA SAC (Bilateral) CYSTOSCOPY (N/A)  SURGEON:  Surgeon(s) and Role:    Carrington Clamp, MD - Primary    ASSISTANTS: Dr. Marlow Baars    ANESTHESIA:   general  EBL:  Total I/O In: 2000 [I.V.:2000] Out: 175 [Urine:100; Blood:75]  LOCAL MEDICATIONS USED:  OTHER Ropivicaine  SPECIMEN:  Source of Specimen:  uterus, cervix, tubes, filschie clips  DISPOSITION OF SPECIMEN:  PATHOLOGY  COUNTS:  YES  TOURNIQUET:  * No tourniquets in log *  DICTATION: .Note written in EPIC  PLAN OF CARE: Admit for overnight observation  PATIENT DISPOSITION:  PACU - hemodynamically stable.   Delay start of Pharmacological VTE agent (>24hrs) due to surgical blood loss or risk of bleeding: not applicable  Complications:  None.  Findings:  9 weeks size boggy uterus. Ovaries were normal appearing. The ureters were identified during multiple points of the case and were always out of the field of dissection.  On cystoscopy, the bladder was intact and bilateral spill was seen from each ureteral orriface.    Medications:  Ancef.  Bupivicaine.    Technique:  After adequate anesthesia was achieved the patient was positioned, prepped and draped in usual sterile fashion.  A speculum was placed in the vagina and the cervix dilated with pratt dilators.  The Advincula with the 3 cm Koh ring were assembled and placed in proper fashion.  The  Speculum was removed and the bladder catheterized with a foley.    Attention was turned to the abdomen where a 1 cm incision was made 1 cm above the umbilicus.  The veress needle was introduced without aspiration of bowel contents or  blood and the abdomen insufflated. The long 12 mm trocar was placed and the other three trocar sites were marked out, all approximately 10 cm from each other and the camera.  Two 8.5 mm trocars were placed on either side of the camera port and a 5 mm assistant port was placed 3 cm above the plane of the other two trocars.  All trocars were inserted under direct visualization of the camera.  The patient was placed in trendelenburg and then the Robot docked.  The PK forceps were placed on arm 2 and the Hot shears on arm 1 and introduced under direct visualization of the camera.  I then broke scrub and sat down at the console.  The above findings were noted and the ureters identified well out of the field of dissection.  The right fallopian tube was isolated and cauterized with the PK.  The Utero-ovarian ligament was then divided with the PK cautery and shears.  The posterior broad ligament was then divided with the hot shears until the uterosacral ligament.  The Broad and cardinal ligaments were then cauterized against the cervix to the level of the Koh ring, securing the uterine artery.  Each pedicle was then incised with the shears.  The anterior leaf was then incised at the reflection of the vessico-uterine junction and the lateral bladder retracted inferiorly after the round ligament had been divided with the PK forceps.  The left tube was cauterized with the PK and divided with the shears;  then the left utero-ovarian ligament divided with the  PK forceps and the scissors.  The round ligament was divided as well and the posterior leaf of the broad ligament then divided with the hot shears. The broad and cardinal ligaments were then cauterized on the left in the same way.   At the level of the internal os, the uterine arteries were bilaterally cauterized with the PK.  The ureters were identified well out of the field of dissection.  .   The bladder was then able to be retracted inferiorly and the vesico-uterine  fascia was incised in the midline until the bladder was removed one cm below the Koh ring.  The hot shears then circumferentially incised the vagina at the level of the reflection on the [facility] ring.  Once the uterus and cervix were amputated, cautery was used to insure hemostasis of the cuff.  Once hemostasis was achieved, the instruments were changed to the mega needle driver and mega suture cut needle driver and the cuff was closed with a running stitches of 0-vicryl V loc.   Cautery was used to ensure hemostasis of the left pedicles very superficially.  The ureters were peristalsing bilaterally well and very lateral to the areas of operation.    The Robot was then undocked and I scrubbed back in.  The needle was removed and Bupivicaine was introduced into the pelvis.  The fascia of the 12 mm trocar was closed with a figure of 8 stitch of 0 vicryl.  The skin incisions were closed with subcuticular stitches of 3-0 vicryl Rapide and Dermabond.  All instruments were removed from the vagina and cystoscopy performed, revealing an intact bladder and vigourous spill of urine from each ureteral orifice.  The cystoscope was removed and the patient taken to the recovery room in stable condition.  Amisha Pospisil A

## 2017-06-02 ENCOUNTER — Encounter (HOSPITAL_COMMUNITY): Payer: Self-pay | Admitting: Obstetrics and Gynecology

## 2017-06-02 DIAGNOSIS — N800 Endometriosis of uterus: Secondary | ICD-10-CM | POA: Diagnosis not present

## 2017-06-02 MED ORDER — OXYCODONE-ACETAMINOPHEN 5-325 MG PO TABS: 1.0000 | ORAL_TABLET | ORAL | 0 refills | Status: DC | PRN

## 2017-06-02 MED ORDER — INFLUENZA VAC SPLIT QUAD 0.5 ML IM SUSY
0.5000 mL | PREFILLED_SYRINGE | Freq: Once | INTRAMUSCULAR | Status: AC
  Administered 2017-06-02: 0.5 mL via INTRAMUSCULAR

## 2017-06-02 NOTE — Progress Notes (Signed)
Patient is eating, ambulating, and voiding.  Pain control is good.  BP 111/68 (BP Location: Left Arm)   Pulse 80   Temp 97.6 F (36.4 C) (Oral)   Resp 16   Ht  (1.702 m)   Wt 277 lb 8 oz (125.9 kg)   LMP 05/26/2017 (Exact Date)   SpO2 98%   BMI 43.46 kg/m   lungs:   clear to auscultation cor:    RRR Abdomen:  soft, appropriate tenderness, incisions intact and without erythema or exudate. ex:    no cords   Lab Results  Component Value Date   WBC 15.6 (H) 06/01/2017   HGB 11.9 (L) 06/01/2017   HCT 36.4 06/01/2017   MCV 84.3 06/01/2017   PLT 419 (H) 06/01/2017    A/P  Routine care.  Expect d/c per plan.  Pt received 2 doses of lovenox for borderline ACA,  Pt to keep ted hose on at home and ambulate.

## 2017-06-02 NOTE — Discharge Instructions (Signed)
Discharge instructions including postoperative care reviewed with patient.  Follow up care and appointments discussed.  Patient verbalizes understanding at this time.  Flu shot given with discharge instructions.

## 2017-06-02 NOTE — Discharge Summary (Signed)
Physician Discharge Summary  Patient ID: Sheryl [name] MRN: [id number] DOB/AGE: Nov 16, 1970 46 y.o.  Admit date: 06/01/2017 Discharge date: 06/02/2017  Admission Diagnoses:menorrhagia  Discharge Diagnoses: same Active Problems:   Postoperative state   Discharged Condition: good  Hospital Course: Uncomplicated TLH, salpingectomies, cysto, partial removal of umbilical hernia sac  Consults: None  Significant Diagnostic Studies: labs:  Results for orders placed or performed during the hospital encounter of 06/01/17 (from the past 24 hour(s))  Pregnancy, urine     Status: None   Collection Time: 06/01/17  9:07 AM  Result Value Ref Range   Preg Test, Ur NEGATIVE NEGATIVE  CBC     Status: Abnormal   Collection Time: 06/01/17  5:45 PM  Result Value Ref Range   WBC 15.6 (H) 4.0 - 10.5 K/uL   RBC 4.32 3.87 - 5.11 MIL/uL   Hemoglobin 11.9 (L) 12.0 - 15.0 g/dL   HCT 41.3 24.4 - 01.0 %   MCV 84.3 78.0 - 100.0 fL   MCH 27.5 26.0 - 34.0 pg   MCHC 32.7 30.0 - 36.0 g/dL   RDW 27.2 53.6 - 64.4 %   Platelets 419 (H) 150 - 400 K/uL  Creatinine, serum     Status: None   Collection Time: 06/01/17  9:09 PM  Result Value Ref Range   Creatinine, Ser 0.74 0.44 - 1.00 mg/dL   GFR calc non Af Amer >60 >60 mL/min   GFR calc Af Amer >60 >60 mL/min    Treatments: surgery: Uncomplicated TLH, salpingectomies, cysto, partial removal of umbilical hernia sac    Discharge Exam: Blood pressure 111/68, pulse 80, temperature 97.6 F (36.4 C), temperature source Oral, resp. rate 16, height  (1.702 m), weight 277 lb 8 oz (125.9 kg), last menstrual period 05/26/2017, SpO2 98 %, unknown if currently breastfeeding.   Disposition: 01-Home or Self Care  Discharge Instructions    Call MD for:  temperature >100.4    Complete by:  As directed    Diet - low sodium heart healthy    Complete by:  As directed    Discharge instructions    Complete by:  As directed    No driving on narcotics, no sexual  activity for 2 weeks.   Increase activity slowly    Complete by:  As directed    May shower / Bathe    Complete by:  As directed    Shower, no bath for 2 weeks.   Remove dressing in 24 hours    Complete by:  As directed    Sexual Activity Restrictions    Complete by:  As directed    No sexual activity for 2 weeks.     Allergies as of 06/02/2017      Reactions   Advil [ibuprofen] Anaphylaxis, Hives   Salicylates Anaphylaxis, Hives   Unknown reaction Sulfur meds      Medication List    TAKE these medications   acetaminophen 500 MG tablet Commonly known as:  TYLENOL Take 500-1,000 mg by mouth every 6 (six) hours as needed (for pain/headaches.).   cetirizine 10 MG tablet Commonly known as:  ZYRTEC Take 10 mg by mouth daily as needed for allergies ((hay fever/pollen)).   eletriptan 40 MG tablet Commonly known as:  RELPAX Take 40 mg by mouth every 2 (two) hours as needed for migraine or headache. May repeat in 2 hours if headache persists or recurs.   iron polysaccharides 150 MG capsule Commonly known as:  NIFEREX Take 150  mg by mouth daily.   multivitamin with minerals Tabs tablet Take 1 tablet by mouth daily.   oxyCODONE-acetaminophen 5-325 MG tablet Commonly known as:  PERCOCET/ROXICET Take 1-2 tablets by mouth every 4 (four) hours as needed for severe pain (moderate to severe pain (when tolerating fluids)).   vitamin B-12 1000 MCG tablet Commonly known as:  CYANOCOBALAMIN Take 1,000 mcg by mouth daily.   Vitamin D (Cholecalciferol) 400 units Tabs Take 400 Units by mouth daily.            Discharge Care Instructions        Start     Ordered   06/02/17 0000  oxyCODONE-acetaminophen (PERCOCET/ROXICET) 5-325 MG tablet  Every 4 hours PRN     06/02/17 0716   06/02/17 0000  Discharge instructions    Comments:  No driving on narcotics, no sexual activity for 2 weeks.   06/02/17 0716   06/02/17 0000  Diet - low sodium heart healthy     06/02/17 0716    06/02/17 0000  Increase activity slowly     06/02/17 0716   06/02/17 0000  May shower / Bathe    Comments:  Shower, no bath for 2 weeks.   06/02/17 0716   06/02/17 0000  Sexual Activity Restrictions    Comments:  No sexual activity for 2 weeks.   06/02/17 0716   06/02/17 0000  Remove dressing in 24 hours     06/02/17 0716   06/02/17 0000  Call MD for:  temperature >100.4     06/02/17 0716     Follow-up Information    Carrington Clamp, MD Follow up in 2 week(s).   Specialty:  Obstetrics and Gynecology Contact information: [address] [place] [address] [phone]           Signed: Krystian Younglove A 06/02/2017, 7:17 AM

## 2017-06-09 ENCOUNTER — Inpatient Hospital Stay (HOSPITAL_COMMUNITY)
Admission: AD | Admit: 2017-06-09 | Discharge: 2017-06-09 | Disposition: A | Discharge status: Home / Self Care | Payer: 59 | Source: Ambulatory Visit | Attending: Obstetrics | Admitting: Obstetrics

## 2017-06-09 ENCOUNTER — Encounter (HOSPITAL_COMMUNITY): Payer: Self-pay

## 2017-06-09 DIAGNOSIS — N939 Abnormal uterine and vaginal bleeding, unspecified: Secondary | ICD-10-CM | POA: Insufficient documentation

## 2017-06-09 DIAGNOSIS — N99820 Postprocedural hemorrhage and hematoma of a genitourinary system organ or structure following a genitourinary system procedure: Secondary | ICD-10-CM

## 2017-06-09 LAB — CBC
HEMATOCRIT: 36.5 % (ref 36.0–46.0)
Hemoglobin: 11.7 g/dL — ABNORMAL LOW (ref 12.0–15.0)
MCH: 27 pg (ref 26.0–34.0)
MCHC: 32.1 g/dL (ref 30.0–36.0)
MCV: 84.3 fL (ref 78.0–100.0)
Platelets: 418 10*3/uL — ABNORMAL HIGH (ref 150–400)
RBC: 4.33 MIL/uL (ref 3.87–5.11)
RDW: 14.9 % (ref 11.5–15.5)
WBC: 11.6 10*3/uL — AB (ref 4.0–10.5)

## 2017-06-09 MED ORDER — OXYCODONE-ACETAMINOPHEN 5-325 MG PO TABS
1.0000 | ORAL_TABLET | Freq: Once | ORAL | Status: AC
  Administered 2017-06-09: 1 via ORAL
  Filled 2017-06-09: qty 1

## 2017-06-09 NOTE — Progress Notes (Signed)
46 y.o. [id number] POD#8 s/p robotic TLH/ bilateral salpingectomy presents to [facility]U with complaints of vaginal bleeding.  She had an uncomplicated post-operative course to date.  She reports that she woke up this morning and noticed that she had a moderate amount of blood in her underwear.  Upon voiding, the toilet water was stained red.  She is unsure if she has had continued bleeding since that time.  She reports some abdominal pain this AM. On discharge, she was taking 2 percocet q4 hours.  She has decreased to 1 percocet prn with last dose at 1600 yesterday.  She denies fevers, chills, nausea, vomiting.  She reports dizziness upon noticing bleeding this morning.  Denies dizziness, SOB, CP at this time.     Past Medical History:  Diagnosis Date  . Allergic rhinitis   . Anemia   . Chronic migraine   . Migraines   . Miscarriage 08/1996, 12/1996   x2  . PONV (postoperative nausea and vomiting)   . SVD (spontaneous vaginal delivery)    x 2  . Vitamin D deficiency     Past Surgical History:  Procedure Laterality Date  . ABDOMINAL HYSTERECTOMY    . BREAST SURGERY     cyst removed from left breast; benign  . CESAREAN SECTION N/A 03/20/2014   Procedure: CESAREAN SECTION;  Surgeon: Loney Laurence, MD;  Location: [facility];  Service: Obstetrics;  Laterality: N/A;  . cyst removed from L breast  10/2006   benign  . CYSTOSCOPY N/A 06/01/2017   Procedure: CYSTOSCOPY;  Surgeon: Carrington Clamp, MD;  Location: [facility];  Service: Gynecology;  Laterality: N/A;  . DILATION AND CURETTAGE OF UTERUS  08/1996, 12/1996   x 2 MAB  . DILATION AND CURETTAGE OF UTERUS    . KNEE ARTHROSCOPY Right 01/2016  . KNEE ARTHROSCOPY Right 08/2016  . live births  10/07/97, 02/08/2000   x2  . ROBOTIC ASSISTED TOTAL HYSTERECTOMY WITH SALPINGECTOMY Bilateral 06/01/2017   Procedure: ROBOTIC ASSISTED TOTAL HYSTERECTOMY WITH SALPINGECTOMY WITH PARTIAL REMOVAL OF HERNIA SAC;  Surgeon: Carrington Clamp, MD;  Location: [facility];  Service:  Gynecology;  Laterality: Bilateral;  . TUBAL LIGATION Bilateral 03/20/2014   Procedure: BILATERAL TUBAL LIGATION;  Surgeon: Loney Laurence, MD;  Location: [facility];  Service: Obstetrics;  Laterality: Bilateral;  . UNILATERAL SALPINGECTOMY Right 03/20/2014   Procedure: UNILATERAL SALPINGECTOMY;  Surgeon: Loney Laurence, MD;  Location: [facility];  Service: Obstetrics;  Laterality: Right;    OB History  Gravida Para Term Preterm AB Living  SAB TAB Ectopic Multiple Live Births  2 0 0   1    # Outcome Date GA Lbr Len/2nd Weight Sex Delivery Anes PTL Lv  5 Term 03/20/14 [redacted]w[redacted]d  3.056 kg (6 lb 11.8 oz) M CS-LTranv Spinal  LIV  4 Preterm           3 Preterm           2 SAB           1 SAB               Social History   Social History  . Marital status: Married    Spouse name: now engaged  . Number of children: 2  . Years of education: N/A   Occupational History  . Production designer, theatre/television/film. Rep [employer]   Social History Main Topics  . Smoking status: Never Smoker  . Smokeless tobacco: Never Used  .  Alcohol use No  . Drug use: No  . Sexual activity: Yes    Birth control/ protection: Surgical   Other Topics Concern  . Not on file   Social History Narrative   ** Merged History Encounter **       Advil [ibuprofen] and Salicylates    Vitals:   06/09/17 0533  BP: 117/80  Pulse: 82  Resp: 19  Temp: 97.8 F (36.6 C)  SpO2: 97%     General:  NAD, obese, resting comfortably Abdomen:  Soft, non-tender.  No rebound or guarding.  Incisions c/d/i with dermabond without erythema or drainage.  SSE: Vaginal cuff visibly in tact.  Scant old blood in vagina.  No active bleeding.  On palpation--cuff in tact, no defects Ext: symmetric, no edema Psych: A&O x 3  Lab Results  Component Value Date   WBC 11.6 (H) 06/09/2017   HGB 11.7 (L) 06/09/2017   HCT 36.5 06/09/2017   MCV 84.3 06/09/2017   PLT 418 (H) 06/09/2017     A/P   46 y.o. [id number] POD#8  s/p rTLH / bilateral salpingectomy VSS, cbc stable from discharge Patient ambulated in halls without any complaints of dizziness or lightheadedness No evidence of active bleeding either from vaginal cuff or intra-abdominal bleeding Discharge to home.  Continued lifting precautions and pelvic rest Instructed to contact [facility] if any further concerns    [facility] GEFFEL Chestine Spore

## 2017-06-09 NOTE — MAU Note (Signed)
Pt reports having a hysterectomy last Wednesday. States she started having some vaginal bleeding sometime during the night. Reports some lower abdominal cramping and dizziness that started tonight as well.

## 2017-06-15 ENCOUNTER — Encounter: Payer: Self-pay | Admitting: Neurology

## 2017-06-15 ENCOUNTER — Ambulatory Visit (INDEPENDENT_AMBULATORY_CARE_PROVIDER_SITE_OTHER): Payer: 59 | Admitting: Neurology

## 2017-06-15 DIAGNOSIS — IMO0002 Reserved for concepts with insufficient information to code with codable children: Secondary | ICD-10-CM

## 2017-06-15 DIAGNOSIS — G43709 Chronic migraine without aura, not intractable, without status migrainosus: Secondary | ICD-10-CM

## 2017-06-15 MED ORDER — RIZATRIPTAN BENZOATE 10 MG PO TBDP: 10.0000 mg | ORAL_TABLET | ORAL | 11 refills | Status: DC | PRN

## 2017-06-15 MED ORDER — ONDANSETRON 4 MG PO TBDP: 4.0000 mg | ORAL_TABLET | Freq: Three times a day (TID) | ORAL | 11 refills | Status: DC | PRN

## 2017-06-15 NOTE — Patient Instructions (Signed)
Magnesium oxide 400 mg twice a day Riboflavin 100 mg twice a day=Vit B2. 

## 2017-06-15 NOTE — Progress Notes (Signed)
PATIENT: Sheryl [name] DOB: 1971/04/08  Chief Complaint  Patient presents with  . Migraine    She has previously been treated by [facility].  Reports 4-6 migraines per month that resolve with Relpax.  She is not on a daily preventive medication.  She has tried the following in the past: Atenolol, Topamax, amitriptyline, nortriptyline, Verapamil and gabapentin.  Marland Kitchen PCP    [name], MD     HISTORICAL  Sheryl [name] is a 46 years old female, seen in refer by  her primary care doctor [name], for evaluation of frequent migraine headache initial evaluation was June 15 2017.  I have reviewed the record, she has a long history of migraine headaches since teenager, gradually getting worse, began to see [name] in 2008, at that time she reported almost daily headaches,  She was treated with preventive medication atenolol 50 mg daily, which is helpful, previously also tried amitriptyline, nortriptyline, verapamil, which all helped some, but gradually loses benefit,  She was able to identify triggers such as cheese, onion, stress, caffeine, chocolate, heat,  Her headache overall has much improved, she is now taking Relpax as needed, that is the only triptan she has tried, at its best, it would take away her headache within one hour, but sometimes her headache would last for half days, occasionally she received Dilaudid, Phenergan and Demerol as abortive treatment  Her typical migraine are right lateralized severe pounding headache with associated light noise sensitivity, nauseous, vomiting, sometimes involving left side,  She was admitted to the hospital in September 2011 for 1 prolonged episode of headache with associated right side weakness and blurry vision, she had extensive evaluations, MRI of the brain showed no acute abnormality. Echocardiogram showed ejection fraction 65%, transcranial Doppler study was normal,   REVIEW OF SYSTEMS: Full 14 system review of  systems performed and notable only for headaches  ALLERGIES: Allergies  Allergen Reactions  . Advil [Ibuprofen] Anaphylaxis and Hives  . Salicylates Anaphylaxis and Hives    Unknown reaction Sulfur meds    HOME MEDICATIONS: Current Outpatient Prescriptions  Medication Sig Dispense Refill  . acetaminophen (TYLENOL) 500 MG tablet Take 500-1,000 mg by mouth every 6 (six) hours as needed (for pain/headaches.).    Marland Kitchen cetirizine (ZYRTEC) 10 MG tablet Take 10 mg by mouth daily as needed for allergies ((hay fever/pollen)).     Marland Kitchen eletriptan (RELPAX) 40 MG tablet Take 40 mg by mouth every 2 (two) hours as needed for migraine or headache. May repeat in 2 hours if headache persists or recurs.    . iron polysaccharides (NIFEREX) 150 MG capsule Take 150 mg by mouth daily.    . Multiple Vitamin (MULTIVITAMIN WITH MINERALS) TABS tablet Take 1 tablet by mouth daily.    Marland Kitchen oxyCODONE-acetaminophen (PERCOCET/ROXICET) 5-325 MG tablet Take 1-2 tablets by mouth every 4 (four) hours as needed for severe pain (moderate to severe pain (when tolerating fluids)). 30 tablet 0  . vitamin B-12 (CYANOCOBALAMIN) 1000 MCG tablet Take 1,000 mcg by mouth daily.    . Vitamin D, Cholecalciferol, 400 units TABS Take 400 Units by mouth daily.     No current facility-administered medications for this visit.     PAST MEDICAL HISTORY: Past Medical History:  Diagnosis Date  . Allergic rhinitis   . Anemia   . Chronic migraine   . Migraines   . Miscarriage 08/1996, 12/1996   x2  . PONV (postoperative nausea and vomiting)   . SVD (spontaneous  vaginal delivery)    x 2  . Vitamin D deficiency     PAST SURGICAL HISTORY: Past Surgical History:  Procedure Laterality Date  . ABDOMINAL HYSTERECTOMY    . BREAST SURGERY     cyst removed from left breast; benign  . CESAREAN SECTION N/A 03/20/2014   Procedure: CESAREAN SECTION;  Surgeon: Loney Laurence, MD;  Location: [facility];  Service: Obstetrics;  Laterality: N/A;  . cyst  removed from L breast  10/2006   benign  . CYSTOSCOPY N/A 06/01/2017   Procedure: CYSTOSCOPY;  Surgeon: Carrington Clamp, MD;  Location: [facility];  Service: Gynecology;  Laterality: N/A;  . DILATION AND CURETTAGE OF UTERUS  08/1996, 12/1996   x 2 MAB  . DILATION AND CURETTAGE OF UTERUS    . KNEE ARTHROSCOPY Right 01/2016  . KNEE ARTHROSCOPY Right 08/2016  . live births  10/07/97, 02/08/2000   x2  . ROBOTIC ASSISTED TOTAL HYSTERECTOMY WITH SALPINGECTOMY Bilateral 06/01/2017   Procedure: ROBOTIC ASSISTED TOTAL HYSTERECTOMY WITH SALPINGECTOMY WITH PARTIAL REMOVAL OF HERNIA SAC;  Surgeon: Carrington Clamp, MD;  Location: [facility];  Service: Gynecology;  Laterality: Bilateral;  . TUBAL LIGATION Bilateral 03/20/2014   Procedure: BILATERAL TUBAL LIGATION;  Surgeon: Loney Laurence, MD;  Location: [facility];  Service: Obstetrics;  Laterality: Bilateral;  . UNILATERAL SALPINGECTOMY Right 03/20/2014   Procedure: UNILATERAL SALPINGECTOMY;  Surgeon: Loney Laurence, MD;  Location: [facility];  Service: Obstetrics;  Laterality: Right;    FAMILY HISTORY: Family History  Problem Relation Age of Onset  . Heart attack Father   . Hypertension Father   . Hyperlipidemia Father   . Asthma Son   . Heart disease Maternal Grandfather   . Cancer Paternal Grandmother        unsure what kind    SOCIAL HISTORY:  Social History   Social History  . Marital status: Married    Spouse name: N/A  . Number of children: 3  . Years of education: 2.5 yrs college   Occupational History  . Production designer, theatre/television/film. Rep [employer]   Social History Main Topics  . Smoking status: Never Smoker  . Smokeless tobacco: Never Used  . Alcohol use No  . Drug use: No  . Sexual activity: Yes    Birth control/ protection: Surgical   Other Topics Concern  . Not on file   Social History Narrative   Lives at home with husband and three children.   Left-handed.   No caffeine use.         PHYSICAL EXAM     Vitals:   06/15/17 0804  BP: 117/81  Pulse: 100  Weight: 279 lb (126.6 kg)  Height:  (1.702 m)    Not recorded      Body mass index is 43.7 kg/m.  PHYSICAL EXAMNIATION:  Gen: NAD, conversant, well nourised, obese, well groomed                     Cardiovascular: Regular rate rhythm, no peripheral edema, warm, nontender. Eyes: Conjunctivae clear without exudates or hemorrhage Neck: Supple, no carotid bruits. Pulmonary: Clear to auscultation bilaterally   NEUROLOGICAL EXAM:  MENTAL STATUS: Speech:    Speech is normal; fluent and spontaneous with normal comprehension.  Cognition:     Orientation to time, place and person     Normal recent and remote memory     Normal Attention span and concentration     Normal Language, naming, repeating,spontaneous speech  Fund of knowledge   CRANIAL NERVES: CN II: Visual fields are full to confrontation. Fundoscopic exam is normal with sharp discs and no vascular changes. Pupils are round equal and briskly reactive to light. CN III, IV, VI: extraocular movement are normal. No ptosis. CN V: Facial sensation is intact to pinprick in all 3 divisions bilaterally. Corneal responses are intact.  CN VII: Face is symmetric with normal eye closure and smile. CN VIII: Hearing is normal to rubbing fingers CN IX, X: Palate elevates symmetrically. Phonation is normal. CN XI: Head turning and shoulder shrug are intact CN XII: Tongue is midline with normal movements and no atrophy.  MOTOR: There is no pronator drift of out-stretched arms. Muscle bulk and tone are normal. Muscle strength is normal.  REFLEXES: Reflexes are 2+ and symmetric at the biceps, triceps, knees, and ankles. Plantar responses are flexor.  SENSORY: Intact to light touch, pinprick, positional sensation and vibratory sensation are intact in fingers and toes.  COORDINATION: Rapid alternating movements and fine finger movements are intact. There is no dysmetria on  finger-to-nose and heel-knee-shin.    GAIT/STANCE: Posture is normal. Gait is steady with normal steps, base, arm swing, and turning. Heel and toe walking are normal. Tandem gait is normal.  Romberg is absent.   DIAGNOSTIC DATA (LABS, IMAGING, TESTING) - I reviewed patient records, labs, notes, testing and imaging myself where available.   ASSESSMENT AND PLAN  Sheryl [name] is a 46 y.o. female   Chronic migraine with aura  I have suggested preventive medications magnesium oxide 400 twice a day, riboflavin 100 mg twice a day  Maxalt plus Zofran as needed   Levert Feinstein, M.D. Ph.D.  [facility] [address] [place], [place] [address] Ph: [phone] Fax: [phone]  CC: [name], MD

## 2017-06-28 ENCOUNTER — Telehealth: Payer: Self-pay | Admitting: Neurology

## 2017-06-28 ENCOUNTER — Encounter: Payer: Self-pay | Admitting: Neurology

## 2017-06-28 NOTE — Telephone Encounter (Signed)
Pt calling to inform that in the last 10 days she has had 7 migraines, pt states her insurance would only allow her 4 of the migraine pills prescribed.  Pt states meds have done her no good and she doesn't know what to do.  Pt note sure if she needs to be seen or not but she would like a call back as soon as possible.Pt inquired about a possible injection

## 2017-06-28 NOTE — Telephone Encounter (Signed)
Per vo by Dr. Terrace ArabiaYan, offer appt for trigger point injections or IV with the following medications: 1) Depakote 1gram 2) Toradol 30mg  3) Compazine 10mg  Spoke to the patient and she would like to try the infusions.  She will be here at 11am with a driver.  [place] signed MD order.

## 2017-09-22 ENCOUNTER — Ambulatory Visit: Payer: 59 | Admitting: Neurology

## 2017-09-22 ENCOUNTER — Telehealth: Payer: Self-pay | Admitting: *Deleted

## 2017-09-22 NOTE — Telephone Encounter (Signed)
Message from Islip TerraceLisa in phone room:  Pt called and due to issue with insurance.  Pt will not be able to keep appointment today.  Pt made aware of No Show policy.

## 2017-10-27 ENCOUNTER — Ambulatory Visit: Payer: 59 | Admitting: Neurology

## 2017-10-27 ENCOUNTER — Encounter: Payer: Self-pay | Admitting: Neurology

## 2017-10-27 VITALS — BP 124/85 | HR 78 | Ht 67.0 in | Wt 276.0 lb

## 2017-10-27 DIAGNOSIS — IMO0002 Reserved for concepts with insufficient information to code with codable children: Secondary | ICD-10-CM

## 2017-10-27 DIAGNOSIS — G43709 Chronic migraine without aura, not intractable, without status migrainosus: Secondary | ICD-10-CM

## 2017-10-27 MED ORDER — TOPIRAMATE 100 MG PO TABS: 100.0000 mg | ORAL_TABLET | Freq: Two times a day (BID) | ORAL | 11 refills | Status: DC

## 2017-10-27 MED ORDER — TIZANIDINE HCL 4 MG PO TABS: 4.0000 mg | ORAL_TABLET | Freq: Four times a day (QID) | ORAL | 6 refills | Status: DC | PRN

## 2017-10-27 MED ORDER — RIZATRIPTAN BENZOATE 10 MG PO TABS: 10.0000 mg | ORAL_TABLET | ORAL | 11 refills | Status: DC | PRN

## 2017-10-27 MED ORDER — RIZATRIPTAN BENZOATE 10 MG PO TBDP: 10.0000 mg | ORAL_TABLET | ORAL | 11 refills | Status: DC | PRN

## 2017-10-27 NOTE — Progress Notes (Signed)
PATIENT: Sheryl [name] DOB: Dec 15, 1970  Chief Complaint  Patient presents with  . Migraine    Her father passed away 07/26/2017.  Since this time, her migraines have increased in frequency.  She has also noticed swelling above her left ear with her migraines.  The swelling subsides along with the headache. Over the last month, she reports having at least 8 migraines.  She is not on a daily preventive.  Her insurance only allows #4 per month.     HISTORICAL  Sheryl [name] is a 47 years old female, seen in refer by  her primary care doctor [name], for evaluation of frequent migraine headache initial evaluation was June 15 2017.  I have reviewed the record, she has a long history of migraine headaches since teenager, gradually getting worse, began to see [name] in 2008, at that time she reported almost daily headaches,  She was treated with preventive medication atenolol 50 mg daily, which is helpful, previously also tried amitriptyline, nortriptyline, verapamil, which all helped some, but gradually loses benefit,  She was able to identify triggers such as cheese, onion, stress, caffeine, chocolate, heat,  Her headache overall has much improved, she is now taking Relpax as needed, that is the only triptan she has tried, at its best, it would take away her headache within one hour, but sometimes her headache would last for half days, occasionally she received Dilaudid, Phenergan and Demerol as abortive treatment  Her typical migraine are right lateralized severe pounding headache with associated light noise sensitivity, nauseous, vomiting, sometimes involving left side,  She was admitted to the hospital in September 2011 for 1 prolonged episode of headache with associated right side weakness and blurry vision, she had extensive evaluations, MRI of the brain showed no acute abnormality. Echocardiogram showed ejection fraction 65%, transcranial Doppler study was normal,  UPDATE  11-17-17: Her father passed away on July 27, 2017, she complains of increased headaches, 8-10 migraines each month,Maxalt does help her migraine, she felt wiped out afterwards     REVIEW OF SYSTEMS: Full 14 system review of systems performed and notable only for headaches  ALLERGIES: Allergies  Allergen Reactions  . Advil [Ibuprofen] Anaphylaxis and Hives  . Salicylates Anaphylaxis and Hives    Unknown reaction Sulfur meds    HOME MEDICATIONS: Current Outpatient Medications  Medication Sig Dispense Refill  . acetaminophen (TYLENOL) 500 MG tablet Take 500-1,000 mg by mouth every 6 (six) hours as needed (for pain/headaches.).    Marland Kitchen cetirizine (ZYRTEC) 10 MG tablet Take 10 mg by mouth daily as needed for allergies ((hay fever/pollen)).     . Multiple Vitamin (MULTIVITAMIN WITH MINERALS) TABS tablet Take 1 tablet by mouth daily.    . ondansetron (ZOFRAN ODT) 4 MG disintegrating tablet Take 1 tablet (4 mg total) by mouth every 8 (eight) hours as needed. 20 tablet 11  . rizatriptan (MAXALT-MLT) 10 MG disintegrating tablet Take 1 tablet (10 mg total) by mouth as needed. May repeat in 2 hours if needed 12 tablet 11  . Vitamin D, Cholecalciferol, 400 units TABS Take 400 Units by mouth daily.     No current facility-administered medications for this visit.     PAST MEDICAL HISTORY: Past Medical History:  Diagnosis Date  . Allergic rhinitis   . Anemia   . Chronic migraine   . Migraines   . Miscarriage 08/1996, 12/1996   x2  . PONV (postoperative nausea and vomiting)   . SVD (spontaneous  vaginal delivery)    x 2  . Vitamin D deficiency     PAST SURGICAL HISTORY: Past Surgical History:  Procedure Laterality Date  . ABDOMINAL HYSTERECTOMY    . BREAST SURGERY     cyst removed from left breast; benign  . CESAREAN SECTION N/A 03/20/2014   Procedure: CESAREAN SECTION;  Surgeon: Loney LaurenceMichelle A Horvath, MD;  Location: [facility];  Service: Obstetrics;  Laterality: N/A;  . cyst removed from L  breast  10/2006   benign  . CYSTOSCOPY N/A 06/01/2017   Procedure: CYSTOSCOPY;  Surgeon: Carrington ClampHorvath, Michelle, MD;  Location: [facility];  Service: Gynecology;  Laterality: N/A;  . DILATION AND CURETTAGE OF UTERUS  08/1996, 12/1996   x 2 MAB  . DILATION AND CURETTAGE OF UTERUS    . KNEE ARTHROSCOPY Right 01/2016  . KNEE ARTHROSCOPY Right 08/2016  . live births  10/07/97, 02/08/2000   x2  . ROBOTIC ASSISTED TOTAL HYSTERECTOMY WITH SALPINGECTOMY Bilateral 06/01/2017   Procedure: ROBOTIC ASSISTED TOTAL HYSTERECTOMY WITH SALPINGECTOMY WITH PARTIAL REMOVAL OF HERNIA SAC;  Surgeon: Carrington ClampHorvath, Michelle, MD;  Location: [facility];  Service: Gynecology;  Laterality: Bilateral;  . TUBAL LIGATION Bilateral 03/20/2014   Procedure: BILATERAL TUBAL LIGATION;  Surgeon: Loney LaurenceMichelle A Horvath, MD;  Location: [facility];  Service: Obstetrics;  Laterality: Bilateral;  . UNILATERAL SALPINGECTOMY Right 03/20/2014   Procedure: UNILATERAL SALPINGECTOMY;  Surgeon: Loney LaurenceMichelle A Horvath, MD;  Location: [facility];  Service: Obstetrics;  Laterality: Right;    FAMILY HISTORY: Family History  Problem Relation Age of Onset  . Heart attack Father   . Hypertension Father   . Hyperlipidemia Father   . Asthma Son   . Heart disease Maternal Grandfather   . Cancer Paternal Grandmother        unsure what kind    SOCIAL HISTORY:  Social History   Socioeconomic History  . Marital status: Married    Spouse name: Not on file  . Number of children: 3  . Years of education: 2.5 yrs college  . Highest education level: Not on file  Social Needs  . Financial resource strain: Not on file  . Food insecurity - worry: Not on file  . Food insecurity - inability: Not on file  . Transportation needs - medical: Not on file  . Transportation needs - non-medical: Not on file  Occupational History  . Occupation: Production designer, theatre/television/filmMedical Insurance Specialist-Sr. Rep    Employer: [employer]  Tobacco Use  . Smoking status: Never Smoker  . Smokeless tobacco: Never Used    Substance and Sexual Activity  . Alcohol use: No  . Drug use: No  . Sexual activity: Yes    Birth control/protection: Surgical  Other Topics Concern  . Not on file  Social History Narrative   Lives at home with husband and three children.   Left-handed.   No caffeine use.         PHYSICAL EXAM   Vitals:   10/27/17 1453  BP: 124/85  Pulse: 78  Weight: 276 lb (125.2 kg)  Height: 5\' 7"  (1.702 m)    Not recorded      Body mass index is 43.23 kg/m.  PHYSICAL EXAMNIATION:  Gen: NAD, conversant, well nourised, obese, well groomed                     Cardiovascular: Regular rate rhythm, no peripheral edema, warm, nontender. Eyes: Conjunctivae clear without exudates or hemorrhage Neck: Supple, no carotid bruits. Pulmonary: Clear to auscultation bilaterally  NEUROLOGICAL EXAM:  MENTAL STATUS: Speech:    Speech is normal; fluent and spontaneous with normal comprehension.  Cognition:     Orientation to time, place and person     Normal recent and remote memory     Normal Attention span and concentration     Normal Language, naming, repeating,spontaneous speech     Fund of knowledge   CRANIAL NERVES: CN II: Visual fields are full to confrontation. Fundoscopic exam is normal with sharp discs and no vascular changes. Pupils are round equal and briskly reactive to light. CN III, IV, VI: extraocular movement are normal. No ptosis. CN V: Facial sensation is intact to pinprick in all 3 divisions bilaterally. Corneal responses are intact.  CN VII: Face is symmetric with normal eye closure and smile. CN VIII: Hearing is normal to rubbing fingers CN IX, X: Palate elevates symmetrically. Phonation is normal. CN XI: Head turning and shoulder shrug are intact CN XII: Tongue is midline with normal movements and no atrophy.  MOTOR: There is no pronator drift of out-stretched arms. Muscle bulk and tone are normal. Muscle strength is normal.  REFLEXES: Reflexes are 2+ and  symmetric at the biceps, triceps, knees, and ankles. Plantar responses are flexor.  SENSORY: Intact to light touch, pinprick, positional sensation and vibratory sensation are intact in fingers and toes.  COORDINATION: Rapid alternating movements and fine finger movements are intact. There is no dysmetria on finger-to-nose and heel-knee-shin.    GAIT/STANCE: Posture is normal. Gait is steady with normal steps, base, arm swing, and turning. Heel and toe walking are normal. Tandem gait is normal.  Romberg is absent.   DIAGNOSTIC DATA (LABS, IMAGING, TESTING) - I reviewed patient records, labs, notes, testing and imaging myself where available.   ASSESSMENT AND PLAN  Sheryl [name] is a 47 y.o. female   Chronic migraine with aura  She has tried magnesium oxide, and riboflavin without helping her headache,  Topamax 100 mg twice a day as migraine prevention  Maxalt as needed, may take it together with tizanidine, Zofran, NSAIDs, for abortive treatment   Levert Feinstein, M.D. Ph.D.  [facility] [address], [address] [place], [place] [address] Ph: [phone] Fax: [phone]  CC: [name], MD

## 2017-11-01 ENCOUNTER — Telehealth: Payer: Self-pay | Admitting: Neurology

## 2017-11-01 MED ORDER — ATENOLOL 25 MG PO TABS: 25.0000 mg | ORAL_TABLET | Freq: Every day | ORAL | 11 refills | Status: DC

## 2017-11-01 NOTE — Telephone Encounter (Signed)
Spoke to patient - states she was previously on Atenolol 25mg  once daily and it worked well for her migraines.  She would like to see if Dr. Terrace ArabiaYan feels this medication would be appropriate.  Dr. Terrace ArabiaYan has reviewed chart.  Per vo by Dr. Terrace ArabiaYan, okay to provide the prescription.  Patient aware it has been sent to her pharmacy.

## 2017-11-01 NOTE — Telephone Encounter (Signed)
Patient started taking topiramate (TOPAMAX) 100 MG tablet on 10-28-17 and started having blurred vision and tingling down her arms and legs. She discontinued medication after taking that 1 pill. Today all symptoms are gone.  Is there another medication she take take. Please call and discuss. Patient uses [facility].

## 2017-11-01 NOTE — Addendum Note (Signed)
Addended by: Lilla ShookKIRKMAN, MICHELLE C on: 11/01/2017 03:14 PM   Modules accepted: Orders

## 2017-11-21 ENCOUNTER — Encounter (HOSPITAL_COMMUNITY): Payer: Self-pay

## 2017-11-21 ENCOUNTER — Other Ambulatory Visit: Payer: Self-pay

## 2017-11-21 ENCOUNTER — Emergency Department (HOSPITAL_COMMUNITY)
Admission: EM | Admit: 2017-11-21 | Discharge: 2017-11-21 | Disposition: A | Discharge status: Home / Self Care | Payer: 59 | Attending: Emergency Medicine | Admitting: Emergency Medicine

## 2017-11-21 ENCOUNTER — Emergency Department (HOSPITAL_COMMUNITY): Payer: 59

## 2017-11-21 DIAGNOSIS — R202 Paresthesia of skin: Secondary | ICD-10-CM | POA: Insufficient documentation

## 2017-11-21 DIAGNOSIS — M79605 Pain in left leg: Secondary | ICD-10-CM | POA: Insufficient documentation

## 2017-11-21 DIAGNOSIS — Z79899 Other long term (current) drug therapy: Secondary | ICD-10-CM | POA: Diagnosis not present

## 2017-11-21 DIAGNOSIS — R200 Anesthesia of skin: Secondary | ICD-10-CM | POA: Diagnosis present

## 2017-11-21 LAB — CBC
HCT: 39.3 % (ref 36.0–46.0)
Hemoglobin: 12.8 g/dL (ref 12.0–15.0)
MCH: 29.4 pg (ref 26.0–34.0)
MCHC: 32.6 g/dL (ref 30.0–36.0)
MCV: 90.3 fL (ref 78.0–100.0)
Platelets: 397 K/uL (ref 150–400)
RBC: 4.35 MIL/uL (ref 3.87–5.11)
RDW: 14.3 % (ref 11.5–15.5)
WBC: 10.7 K/uL — ABNORMAL HIGH (ref 4.0–10.5)

## 2017-11-21 LAB — COMPREHENSIVE METABOLIC PANEL WITH GFR
ALT: 17 U/L (ref 14–54)
AST: 23 U/L (ref 15–41)
Albumin: 3.3 g/dL — ABNORMAL LOW (ref 3.5–5.0)
Alkaline Phosphatase: 60 U/L (ref 38–126)
Anion gap: 11 (ref 5–15)
BUN: 9 mg/dL (ref 6–20)
CO2: 23 mmol/L (ref 22–32)
Calcium: 8.8 mg/dL — ABNORMAL LOW (ref 8.9–10.3)
Chloride: 103 mmol/L (ref 101–111)
Creatinine, Ser: 0.72 mg/dL (ref 0.44–1.00)
GFR calc Af Amer: >60 mL/min (ref >60)
GFR calc non Af Amer: >60 mL/min (ref >60)
Glucose, Bld: 105 mg/dL — ABNORMAL HIGH (ref 65–99)
Potassium: 4.2 mmol/L (ref 3.5–5.1)
Sodium: 137 mmol/L (ref 135–145)
Total Bilirubin: 0.4 mg/dL (ref 0.3–1.2)
Total Protein: 6.1 g/dL — ABNORMAL LOW (ref 6.5–8.1)

## 2017-11-21 LAB — PROTIME-INR
INR: 1.03
Prothrombin Time: 13.4 seconds (ref 11.4–15.2)

## 2017-11-21 LAB — I-STAT CHEM 8, ED
BUN: 9 mg/dL (ref 6–20)
CREATININE: 0.7 mg/dL (ref 0.44–1.00)
Calcium, Ion: 1.04 mmol/L — ABNORMAL LOW (ref 1.15–1.40)
Chloride: 102 mmol/L (ref 101–111)
Glucose, Bld: 105 mg/dL — ABNORMAL HIGH (ref 65–99)
HCT: 38 % (ref 36.0–46.0)
HEMOGLOBIN: 12.9 g/dL (ref 12.0–15.0)
Potassium: 4.1 mmol/L (ref 3.5–5.1)
SODIUM: 139 mmol/L (ref 135–145)
TCO2: 26 mmol/L (ref 22–32)

## 2017-11-21 LAB — DIFFERENTIAL
Basophils Absolute: 0 K/uL (ref 0.0–0.1)
Basophils Relative: 0 %
Eosinophils Absolute: 0.2 K/uL (ref 0.0–0.7)
Eosinophils Relative: 2 %
Lymphocytes Relative: 25 %
Lymphs Abs: 2.6 K/uL (ref 0.7–4.0)
Monocytes Absolute: 0.8 K/uL (ref 0.1–1.0)
Monocytes Relative: 7 %
Neutro Abs: 7.1 K/uL (ref 1.7–7.7)
Neutrophils Relative %: 66 %

## 2017-11-21 LAB — I-STAT TROPONIN, ED: Troponin i, poc: 0 ng/mL (ref 0.00–0.08)

## 2017-11-21 LAB — I-STAT BETA HCG BLOOD, ED (MC, WL, AP ONLY): I-stat hCG, quantitative: <5 m[IU]/mL (ref <5)

## 2017-11-21 LAB — APTT: aPTT: 29 s (ref 24–36)

## 2017-11-21 NOTE — ED Triage Notes (Signed)
Patient from home with recent diagnosis of pinched nerve in lower left leg.  MRI scheduled for Monday.  Numbness progressed to bilateral arms and to posterior of the skull. Still ambulatory.  Denies any lightheaded, chest pain, or dizziness.  Does have pain where the numbness resides.  Vitals stable, Negative VAN.

## 2017-11-21 NOTE — ED Triage Notes (Signed)
Left leg numbess chronic, bilateral arm numbness began 0030 and has not resolved.

## 2017-11-21 NOTE — ED Notes (Signed)
Pt verbalizes understanding of d/c instructions. Pt taken to lobby in wheelchair at d/c with all belongings.   

## 2017-11-21 NOTE — ED Provider Notes (Signed)
[facility] EMERGENCY DEPARTMENT Provider Note   CSN: [id number] Arrival date & time: 11/21/17  0227     History   Chief Complaint Chief Complaint  Patient presents with  . Numbness    bilateral arms, left leg    HPI Sheryl [name] is a 47 y.o. female.  Patient presents with sharp painful tingling sensation in LE's and UE's bilaterally tonight. She denies neck pain, weakness, swelling. Symptoms improved and are now worsening again. She is being treated for a lumbar radiculopathy that causes pain in her left LE but no new medications started in the last 24-48 hours.    The history is provided by the patient and the spouse. No language interpreter was used.    Past Medical History:  Diagnosis Date  . Allergic rhinitis   . Anemia   . Chronic migraine   . Migraines   . Miscarriage 08/1996, 12/1996   x2  . PONV (postoperative nausea and vomiting)   . SVD (spontaneous vaginal delivery)    x 2  . Vitamin D deficiency     Patient Active Problem List   Diagnosis Date Noted  . Chronic migraine 06/15/2017  . Postoperative state 03/20/2014  . Normal labor 03/19/2014  . Atypical chest pain 01/07/2012    Past Surgical History:  Procedure Laterality Date  . ABDOMINAL HYSTERECTOMY    . BREAST SURGERY     cyst removed from left breast; benign  . CESAREAN SECTION N/A 03/20/2014   Procedure: CESAREAN SECTION;  Surgeon: Loney Laurence, MD;  Location: [facility];  Service: Obstetrics;  Laterality: N/A;  . cyst removed from L breast  10/2006   benign  . CYSTOSCOPY N/A 06/01/2017   Procedure: CYSTOSCOPY;  Surgeon: Carrington Clamp, MD;  Location: [facility];  Service: Gynecology;  Laterality: N/A;  . DILATION AND CURETTAGE OF UTERUS  08/1996, 12/1996   x 2 MAB  . DILATION AND CURETTAGE OF UTERUS    . KNEE ARTHROSCOPY Right 01/2016  . KNEE ARTHROSCOPY Right 08/2016  . live births  10/07/97, 02/08/2000   x2  . ROBOTIC ASSISTED TOTAL HYSTERECTOMY WITH SALPINGECTOMY  Bilateral 06/01/2017   Procedure: ROBOTIC ASSISTED TOTAL HYSTERECTOMY WITH SALPINGECTOMY WITH PARTIAL REMOVAL OF HERNIA SAC;  Surgeon: Carrington Clamp, MD;  Location: [facility];  Service: Gynecology;  Laterality: Bilateral;  . TUBAL LIGATION Bilateral 03/20/2014   Procedure: BILATERAL TUBAL LIGATION;  Surgeon: Loney Laurence, MD;  Location: [facility];  Service: Obstetrics;  Laterality: Bilateral;  . UNILATERAL SALPINGECTOMY Right 03/20/2014   Procedure: UNILATERAL SALPINGECTOMY;  Surgeon: Loney Laurence, MD;  Location: [facility];  Service: Obstetrics;  Laterality: Right;    OB History    Gravida Para Term Preterm AB Living   5 3 1 2 2 3    SAB TAB Ectopic Multiple Live Births   2 0 0   1       Home Medications    Prior to Admission medications   Medication Sig Start Date End Date Taking? Authorizing Provider  acetaminophen (TYLENOL) 500 MG tablet Take 500-1,000 mg by mouth every 6 (six) hours as needed (for pain/headaches.).   Yes [provider]  atenolol (TENORMIN) 25 MG tablet Take 1 tablet (25 mg total) by mouth daily. 11/01/17  Yes Levert Feinstein, MD  cetirizine (ZYRTEC) 10 MG tablet Take 10 mg by mouth daily as needed for allergies ((hay fever/pollen)).    Yes [provider]  HYDROcodone-acetaminophen (NORCO/VICODIN) 5-325 MG tablet Take 1 tablet by mouth  every 6 (six) hours as needed for pain. 11/07/17  Yes [provider]  Multiple Vitamin (MULTIVITAMIN WITH MINERALS) TABS tablet Take 1 tablet by mouth daily.   Yes [provider]  ondansetron (ZOFRAN ODT) 4 MG disintegrating tablet Take 1 tablet (4 mg total) by mouth every 8 (eight) hours as needed. 06/15/17  Yes Levert Feinstein, MD  rizatriptan (MAXALT) 10 MG tablet Take 1 tablet (10 mg total) by mouth as needed for migraine. May repeat in 2 hours if needed 10/27/17  Yes Levert Feinstein, MD  tiZANidine (ZANAFLEX) 4 MG tablet Take 1 tablet (4 mg total) by mouth every 6 (six) hours as needed for muscle spasms.  10/27/17  Yes Levert Feinstein, MD    Family History Family History  Problem Relation Age of Onset  . Heart attack Father   . Hypertension Father   . Hyperlipidemia Father   . Asthma Son   . Heart disease Maternal Grandfather   . Cancer Paternal Grandmother        unsure what kind    Social History Social History   Tobacco Use  . Smoking status: Never Smoker  . Smokeless tobacco: Never Used  Substance Use Topics  . Alcohol use: No  . Drug use: No     Allergies   Advil [ibuprofen] and Salicylates   Review of Systems Review of Systems  Constitutional: Negative for chills and fever.  Respiratory: Negative.  Negative for shortness of breath.   Cardiovascular: Negative.  Negative for chest pain.  Gastrointestinal: Negative.  Negative for nausea and vomiting.  Musculoskeletal: Positive for back pain and neck pain.  Skin: Negative.  Negative for color change.  Neurological: Negative.  Negative for weakness and numbness.       See HPI.     Physical Exam Updated Vital Signs BP 120/77 (BP Location: Right Arm)   Pulse 81   Temp 98.2 F (36.8 C) (Oral)   Resp 18   LMP 05/26/2017 (Exact Date)   SpO2 98%   Physical Exam  Constitutional: She is oriented to person, place, and time. She appears well-developed and well-nourished.  HENT:  Head: Normocephalic.  Neck: Normal range of motion. Neck supple.  Cardiovascular: Normal rate and regular rhythm.  Pulmonary/Chest: Effort normal and breath sounds normal. She has no wheezes. She has no rales.  Abdominal: Soft. Bowel sounds are normal. There is no tenderness. There is no rebound and no guarding.  Musculoskeletal: Normal range of motion.  Neurological: She is alert and oriented to person, place, and time. She displays normal reflexes. No sensory deficit.  CN's 3-12 grossly intact. Speech is clear and focused. No facial asymmetry. No lateralizing weakness. Reflexes are equal. No deficits of gross or fine motor coordination.  Ambulatory without imbalance.    Skin: Skin is warm and dry. No rash noted.  Psychiatric: She has a normal mood and affect.     ED Treatments / Results  Labs (all labs ordered are listed, but only abnormal results are displayed) Labs Reviewed  CBC - Abnormal; Notable for the following components:      Result Value   WBC 10.7 (*)    All other components within normal limits  COMPREHENSIVE METABOLIC PANEL - Abnormal; Notable for the following components:   Glucose, Bld 105 (*)    Calcium 8.8 (*)    Total Protein 6.1 (*)    Albumin 3.3 (*)    All other components within normal limits  I-STAT CHEM 8, ED - Abnormal; Notable  for the following components:   Glucose, Bld 105 (*)    Calcium, Ion 1.04 (*)    All other components within normal limits  PROTIME-INR  APTT  DIFFERENTIAL  I-STAT TROPONIN, ED  I-STAT BETA HCG BLOOD, ED ([facility], [facility], [facility] ONLY)  CBG MONITORING, ED    EKG  EKG Interpretation None       Radiology Ct Head Wo Contrast  Result Date: 11/21/2017 CLINICAL DATA:  Focal neuro deficit, > 6 hrs, stroke suspected; Numbness or tingling, paresthesia. EXAM: CT HEAD WITHOUT CONTRAST CT CERVICAL SPINE WITHOUT CONTRAST TECHNIQUE: Multidetector CT imaging of the head and cervical spine was performed following the standard protocol without intravenous contrast. Multiplanar CT image reconstructions of the cervical spine were also generated. COMPARISON:  Head CT 06/02/2010 FINDINGS: CT HEAD FINDINGS Brain: No intracranial hemorrhage, mass effect, or midline shift. No hydrocephalus. The basilar cisterns are patent. No evidence of territorial infarct or acute ischemia. No extra-axial or intracranial fluid collection. Vascular: No hyperdense vessel or unexpected calcification. Skull: Unchanged small osseous protuberance from the left frontal bone. No suspicious lesion. No skull fracture. Sinuses/Orbits: Paranasal sinuses and mastoid air cells are clear. The visualized orbits are  unremarkable. Other: None. CT CERVICAL SPINE FINDINGS Alignment: 2 mm anterolisthesis of C2 on C3. Reverse of normal cervical lordosis. Skull base and vertebrae: No acute fracture. Vertebral body heights are maintained. The dens and skull base are intact. Soft tissues and spinal canal: No prevertebral fluid or swelling. No visible canal hematoma. Disc levels: Disc space narrowing and endplate spurring at C4-C5, C5-C6, and C6-C7. Mild left neural foraminal stenosis at C4-C5, bilateral neural foraminal stenosis at C5-C6 and C6-C7. Mild canal stenosis at C5-C6 secondary to peripherally calcified disc osteophyte complex. Facet hypertrophy at C5-C6 and C6-C7, mild. Upper chest: No acute finding. Other: None. IMPRESSION: 1. Unremarkable noncontrast head CT. 2. 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 Mild multilevel neural foraminal stenosis. Electronically Signed   By: Rubye OaksMelanie  Ehinger M.D.   On: 11/21/2017 04:00   Ct Cervical Spine Wo Contrast  Result Date: 11/21/2017 CLINICAL DATA:  Focal neuro deficit, > 6 hrs, stroke suspected; Numbness or tingling, paresthesia. EXAM: CT HEAD WITHOUT CONTRAST CT CERVICAL SPINE WITHOUT CONTRAST TECHNIQUE: Multidetector CT imaging of the head and cervical spine was performed following the standard protocol without intravenous contrast. Multiplanar CT image reconstructions of the cervical spine were also generated. COMPARISON:  Head CT 06/02/2010 FINDINGS: CT HEAD FINDINGS Brain: No intracranial hemorrhage, mass effect, or midline shift. No hydrocephalus. The basilar cisterns are patent. No evidence of territorial infarct or acute ischemia. No extra-axial or intracranial fluid collection. Vascular: No hyperdense vessel or unexpected calcification. Skull: Unchanged small osseous protuberance from the left frontal bone. No suspicious lesion. No skull fracture. Sinuses/Orbits: Paranasal sinuses and mastoid air cells  are clear. The visualized orbits are unremarkable. Other: None. CT CERVICAL SPINE FINDINGS Alignment: 2 mm anterolisthesis of C2 on C3. Reverse of normal cervical lordosis. Skull base and vertebrae: No acute fracture. Vertebral body heights are maintained. The dens and skull base are intact. Soft tissues and spinal canal: No prevertebral fluid or swelling. No visible canal hematoma. Disc levels: Disc space narrowing and endplate spurring at C4-C5, C5-C6, and C6-C7. Mild left neural foraminal stenosis at C4-C5, bilateral neural foraminal stenosis at C5-C6 and C6-C7. Mild canal stenosis at C5-C6 secondary to peripherally calcified disc osteophyte complex. Facet hypertrophy at C5-C6 and C6-C7, mild. Upper chest: No acute finding.  Other: None. IMPRESSION: 1. Unremarkable noncontrast head CT. 2. 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 Mild multilevel neural foraminal stenosis. Electronically Signed   By: Rubye Oaks M.D.   On: 11/21/2017 04:00    Procedures Procedures (including critical care time)  Medications Ordered in ED Medications - No data to display   Initial Impression / Assessment and Plan / ED Course  I have reviewed the triage vital signs and the nursing notes.  Pertinent labs & imaging results that were available during my care of the patient were reviewed by me and considered in my medical decision making (see chart for details).     Patient presents with complaint of being woken up tonight by a painful tingling in her extremities. She denies weakness.   Head/neck  CT are negative. No neurologic deficits on exam. No sensory or strength discrepancies. No vascular compromise.   She is felt appropriate for discharge home with PCP follow up for further management. She is scheduled for a lumbar MRI currently. She is encouraged to talk to her doctor if symptoms continue to consider adding a neck MRI for complete  evaluation.   Final Clinical Impressions(s) / ED Diagnoses   Final diagnoses:  None   1. Paresthesias   ED Discharge Orders    None       Elpidio Anis, Cordelia Poche 11/21/17 0981    Ward, Layla Maw, DO 11/21/17 [phone]

## 2017-11-21 NOTE — ED Notes (Signed)
ED Provider at bedside. 

## 2018-01-04 ENCOUNTER — Ambulatory Visit (HOSPITAL_COMMUNITY)
Admission: EM | Admit: 2018-01-04 | Discharge: 2018-01-04 | Disposition: A | Discharge status: Home / Self Care | Payer: 59 | Attending: Family Medicine | Admitting: Family Medicine

## 2018-01-04 ENCOUNTER — Encounter (HOSPITAL_COMMUNITY): Payer: Self-pay | Admitting: Emergency Medicine

## 2018-01-04 DIAGNOSIS — J069 Acute upper respiratory infection, unspecified: Secondary | ICD-10-CM

## 2018-01-04 DIAGNOSIS — B9789 Other viral agents as the cause of diseases classified elsewhere: Secondary | ICD-10-CM

## 2018-01-04 DIAGNOSIS — J40 Bronchitis, not specified as acute or chronic: Secondary | ICD-10-CM

## 2018-01-04 MED ORDER — AZITHROMYCIN 250 MG PO TABS: ORAL_TABLET | ORAL | 0 refills | Status: DC

## 2018-01-04 MED ORDER — PREDNISONE 20 MG PO TABS: 20.0000 mg | ORAL_TABLET | Freq: Two times a day (BID) | ORAL | 0 refills | Status: DC

## 2018-01-04 MED ORDER — BENZONATATE 200 MG PO CAPS: 200.0000 mg | ORAL_CAPSULE | Freq: Three times a day (TID) | ORAL | 0 refills | Status: DC | PRN

## 2018-01-04 NOTE — ED Triage Notes (Signed)
Pt sts URI sx with cough 

## 2018-01-04 NOTE — Discharge Instructions (Signed)
Push fluidt ake the antibiotic as directed Take 2 pills today Take the prednisone as directed Tessalon as needed Return as needed

## 2018-01-04 NOTE — ED Provider Notes (Signed)
[facility]    CSN: [id number] Arrival date & time: 01/04/18  1617     History   Chief Complaint Chief Complaint  Patient presents with  . URI    HPI Sheryl [name] is a 47 y.o. female.   HPI  Patient had an upper respiratory infection with cough.  The upper respiratory infection improved but the cough has persisted.  She has had a cough now for 4 almost 5 weeks.  She states that she coughs day and night.  She does not have very much sputum.  The car seems harsh.  At times when she coughs it causes some chest wall pain.  No nausea or vomiting.  No fever chills.  She states she is very tired from all the coughing.  She is taken a variety of over-the-counter medicines.  Nothing seems to make it go away.  She does not have underlying lung disease, asthma, COPD, or cigarette smoking.  Past Medical History:  Diagnosis Date  . Allergic rhinitis   . Anemia   . Chronic migraine   . Migraines   . Miscarriage 08/1996, 12/1996   x2  . PONV (postoperative nausea and vomiting)   . SVD (spontaneous vaginal delivery)    x 2  . Vitamin D deficiency     Patient Active Problem List   Diagnosis Date Noted  . Chronic migraine 06/15/2017  . Postoperative state 03/20/2014  . Normal labor 03/19/2014  . Atypical chest pain 01/07/2012    Past Surgical History:  Procedure Laterality Date  . ABDOMINAL HYSTERECTOMY    . BREAST SURGERY     cyst removed from left breast; benign  . CESAREAN SECTION N/A 03/20/2014   Procedure: CESAREAN SECTION;  Surgeon: Loney Laurence, MD;  Location: [facility];  Service: Obstetrics;  Laterality: N/A;  . cyst removed from L breast  10/2006   benign  . CYSTOSCOPY N/A 06/01/2017   Procedure: CYSTOSCOPY;  Surgeon: Carrington Clamp, MD;  Location: [facility];  Service: Gynecology;  Laterality: N/A;  . DILATION AND CURETTAGE OF UTERUS  08/1996, 12/1996   x 2 MAB  . DILATION AND CURETTAGE OF UTERUS    . KNEE ARTHROSCOPY Right 01/2016  . KNEE ARTHROSCOPY Right  08/2016  . live births  10/07/97, 02/08/2000   x2  . ROBOTIC ASSISTED TOTAL HYSTERECTOMY WITH SALPINGECTOMY Bilateral 06/01/2017   Procedure: ROBOTIC ASSISTED TOTAL HYSTERECTOMY WITH SALPINGECTOMY WITH PARTIAL REMOVAL OF HERNIA SAC;  Surgeon: Carrington Clamp, MD;  Location: [facility];  Service: Gynecology;  Laterality: Bilateral;  . TUBAL LIGATION Bilateral 03/20/2014   Procedure: BILATERAL TUBAL LIGATION;  Surgeon: Loney Laurence, MD;  Location: [facility];  Service: Obstetrics;  Laterality: Bilateral;  . UNILATERAL SALPINGECTOMY Right 03/20/2014   Procedure: UNILATERAL SALPINGECTOMY;  Surgeon: Loney Laurence, MD;  Location: [facility];  Service: Obstetrics;  Laterality: Right;    OB History    Gravida  5   Para  3   Term  1   Preterm  2   AB  2   Living  3     SAB  2   TAB  0   Ectopic  0   Multiple      Live Births  1            Home Medications    Prior to Admission medications   Medication Sig Start Date End Date Taking? Authorizing Provider  acetaminophen (TYLENOL) 500 MG tablet Take 500-1,000 mg by mouth every 6 (six)  hours as needed (for pain/headaches.).    [provider]  atenolol (TENORMIN) 25 MG tablet Take 1 tablet (25 mg total) by mouth daily. 11/01/17   Levert Feinstein, MD  azithromycin (ZITHROMAX) 250 MG tablet tad 01/04/18   Eustace Moore, MD  benzonatate (TESSALON) 200 MG capsule Take 1 capsule (200 mg total) by mouth 3 (three) times daily as needed for cough. 01/04/18   Eustace Moore, MD  cetirizine (ZYRTEC) 10 MG tablet Take 10 mg by mouth daily as needed for allergies ((hay fever/pollen)).     [provider]  HYDROcodone-acetaminophen (NORCO/VICODIN) 5-325 MG tablet Take 1 tablet by mouth every 6 (six) hours as needed for pain. 11/07/17   [provider]  Multiple Vitamin (MULTIVITAMIN WITH MINERALS) TABS tablet Take 1 tablet by mouth daily.    [provider]  ondansetron (ZOFRAN ODT) 4 MG disintegrating tablet Take  1 tablet (4 mg total) by mouth every 8 (eight) hours as needed. 06/15/17   Levert Feinstein, MD  predniSONE (DELTASONE) 20 MG tablet Take 1 tablet (20 mg total) by mouth 2 (two) times daily with a meal. 01/04/18   Eustace Moore, MD  rizatriptan (MAXALT) 10 MG tablet Take 1 tablet (10 mg total) by mouth as needed for migraine. May repeat in 2 hours if needed 10/27/17   Levert Feinstein, MD  tiZANidine (ZANAFLEX) 4 MG tablet Take 1 tablet (4 mg total) by mouth every 6 (six) hours as needed for muscle spasms. 10/27/17   Levert Feinstein, MD    Family History Family History  Problem Relation Age of Onset  . Heart attack Father   . Hypertension Father   . Hyperlipidemia Father   . Asthma Son   . Heart disease Maternal Grandfather   . Cancer Paternal Grandmother        unsure what kind    Social History Social History   Tobacco Use  . Smoking status: Never Smoker  . Smokeless tobacco: Never Used  Substance Use Topics  . Alcohol use: No  . Drug use: No     Allergies   Advil [ibuprofen] and Salicylates   Review of Systems Review of Systems  Constitutional: Positive for fatigue. Negative for chills and fever.  HENT: Negative for congestion, ear pain, postnasal drip and sore throat.   Eyes: Negative for pain and visual disturbance.  Respiratory: Positive for cough. Negative for shortness of breath.   Cardiovascular: Negative for chest pain and palpitations.  Gastrointestinal: Negative for abdominal pain and vomiting.  Genitourinary: Negative for dysuria and hematuria.       Incontinence with coughing  Musculoskeletal: Negative for arthralgias and back pain.  Skin: Negative for color change and rash.  Neurological: Negative for seizures and syncope.  Psychiatric/Behavioral: Positive for sleep disturbance.  All other systems reviewed and are negative.    Physical Exam Triage Vital Signs ED Triage Vitals [01/04/18 1640]  Enc Vitals Group     BP 121/85     Pulse Rate 83     Resp 18      Temp 99 F (37.2 C)     Temp Source Oral     SpO2 95 %     Weight      Height      Head Circumference      Peak Flow      Pain Score      Pain Loc      Pain Edu?      Excl. in GC?  No data found.  Updated Vital Signs BP 121/85 (BP Location: Left Arm)   Pulse 83   Temp 99 F (37.2 C) (Oral)   Resp 18   LMP 05/26/2017 (Exact Date)   SpO2 95%   :     Physical Exam  Constitutional: She appears well-developed and well-nourished. No distress.  HENT:  Head: Normocephalic and atraumatic.  Right Ear: External ear normal.  Left Ear: External ear normal.  Nose: Nose normal.  Mouth/Throat: Oropharynx is clear and moist.  Eyes: Conjunctivae are normal.  Neck: Neck supple.  Cardiovascular: Normal rate and regular rhythm.  No murmur heard. Pulmonary/Chest: Effort normal and breath sounds normal. No respiratory distress.  Central rhonchi,, few central wheeze  Abdominal: Soft. There is no tenderness.  Musculoskeletal: She exhibits no edema.  Neurological: She is alert.  Skin: Skin is warm and dry.  Psychiatric: She has a normal mood and affect.  Nursing note and vitals reviewed.    UC Treatments / Results  Labs (all labs ordered are listed, but only abnormal results are displayed)   Initial Impression / Assessment and Plan / UC Course  I have reviewed the triage vital signs and the nursing notes.  Pertinent labs & imaging results that were available during my care of the patient were reviewed by me and considered in my medical decision making (see chart for details).     Discussed with patient that the majority of these infections are caused by virus.  Because she is having some wheezing, some persistent symptoms, accompanied by fatigue I think that it is reasonable to give her a course of antibiotics and prednisone.  Follow-up with her PCP Final Clinical Impressions(s) / UC Diagnoses   Final diagnoses:  Viral URI with cough  Bronchitis     Discharge  Instructions     Push fluidt ake the antibiotic as directed Take 2 pills today Take the prednisone as directed Tessalon as needed Return as needed   ED Prescriptions    Medication Sig Dispense Auth. Provider   benzonatate (TESSALON) 200 MG capsule Take 1 capsule (200 mg total) by mouth 3 (three) times daily as needed for cough. 20 capsule Eustace Moore, MD   predniSONE (DELTASONE) 20 MG tablet Take 1 tablet (20 mg total) by mouth 2 (two) times daily with a meal. 10 tablet Eustace Moore, MD   azithromycin [facility]) 250 MG tablet tad 6 each Eustace Moore, MD     Controlled Substance Prescriptions [place] Controlled Substance Registry consulted? Not Applicable   Eustace Moore, MD 01/04/18 2159

## 2018-02-27 NOTE — Progress Notes (Signed)
[facility]  PATIENT: Sheryl [name] DOB: 17-Feb-1971   REASON FOR VISIT: Follow-up for migraine HISTORY FROM: Patient    HISTORY OF PRESENT ILLNESS: Sheryl [name] is a 47 years old female, seen in refer by  her primary care doctor [name], for evaluation of frequent migraine headache initial evaluation was June 15 2017.  I have reviewed the record, she has a long history of migraine headaches since teenager, gradually getting worse, began to see [name] in 2008, at that time she reported almost daily headaches,  She was treated with preventive medication atenolol 50 mg daily, which is helpful, previously also tried amitriptyline, nortriptyline, verapamil, which all helped some, but gradually loses benefit,  She was able to identify triggers such as cheese, onion, stress, caffeine, chocolate, heat,  Her headache overall has much improved, she is now taking Relpax as needed, that is the only triptan she has tried, at its best, it would take away her headache within one hour, but sometimes her headache would last for half days, occasionally she received Dilaudid, Phenergan and Demerol as abortive treatment  Her typical migraine are right lateralized severe pounding headache with associated light noise sensitivity, nauseous, vomiting, sometimes involving left side,  She was admitted to the hospital in September 2011 for 1 prolonged episode of headache with associated right side weakness and blurry vision, she had extensive evaluations, MRI of the brain showed no acute abnormality. Echocardiogram showed ejection fraction 65%, transcranial Doppler study was normal,  UPDATE 2017/11/03:YY Her father passed away on 07/13/2017, she complains of increased headaches, 8-10 migraines each month,Maxalt does help her migraine, she felt wiped out afterwards   UPDATE 6/25/2019CM [name], 47 year old female returns for follow-up of migraines which are doing well  since being placed on atenolol.  She is no longer on Topamax.  She has had one migraine in the last 30 days.  She wants to be switched to Relpax from Maxalt.  Relpax works better and she only gets 4 pills/month.  She returns for reevaluation.  Recent MRI of the lumbar spine showed 2 ruptured disc.  She is due to see neurosurgery.  REVIEW OF SYSTEMS: Full 14 system review of systems performed and notable only for those listed, all others are neg:  Constitutional: neg  Cardiovascular: neg Ear/Nose/Throat: neg  Skin: neg Eyes: neg Respiratory: neg Gastroitestinal: neg  Hematology/Lymphatic: neg  Endocrine: neg Musculoskeletal: Back pain Allergy/Immunology: neg Neurological: History of migraines Psychiatric: neg Sleep : neg   ALLERGIES: Allergies  Allergen Reactions  . Advil [Ibuprofen] Anaphylaxis and Hives  . Salicylates Anaphylaxis and Hives    Unknown reaction Sulfur meds    HOME MEDICATIONS: Outpatient Medications Prior to Visit  Medication Sig Dispense Refill  . acetaminophen (TYLENOL) 500 MG tablet Take 500-1,000 mg by mouth every 6 (six) hours as needed (for pain/headaches.).    Marland Kitchen atenolol (TENORMIN) 25 MG tablet Take 1 tablet (25 mg total) by mouth daily. 30 tablet 11  . cetirizine (ZYRTEC) 10 MG tablet Take 10 mg by mouth daily as needed for allergies ((hay fever/pollen)).     . Multiple Vitamin (MULTIVITAMIN WITH MINERALS) TABS tablet Take 1 tablet by mouth daily.    . ondansetron (ZOFRAN ODT) 4 MG disintegrating tablet Take 1 tablet (4 mg total) by mouth every 8 (eight) hours as needed. 20 tablet 11  . rizatriptan (MAXALT) 10 MG tablet Take 1 tablet (10 mg total) by mouth as needed for migraine. May repeat  in 2 hours if needed 12 tablet 11  . tiZANidine (ZANAFLEX) 4 MG tablet Take 1 tablet (4 mg total) by mouth every 6 (six) hours as needed for muscle spasms. 30 tablet 6  . azithromycin (ZITHROMAX) 250 MG tablet tad 6 each 0  . benzonatate (TESSALON) 200 MG capsule Take  1 capsule (200 mg total) by mouth 3 (three) times daily as needed for cough. 20 capsule 0  . HYDROcodone-acetaminophen (NORCO/VICODIN) 5-325 MG tablet Take 1 tablet by mouth every 6 (six) hours as needed for pain.    . predniSONE (DELTASONE) 20 MG tablet Take 1 tablet (20 mg total) by mouth 2 (two) times daily with a meal. 10 tablet 0   No facility-administered medications prior to visit.     PAST MEDICAL HISTORY: Past Medical History:  Diagnosis Date  . Allergic rhinitis   . Anemia   . Chronic migraine   . Migraines   . Miscarriage 08/1996, 12/1996   x2  . PONV (postoperative nausea and vomiting)   . SVD (spontaneous vaginal delivery)    x 2  . Vitamin D deficiency     PAST SURGICAL HISTORY: Past Surgical History:  Procedure Laterality Date  . ABDOMINAL HYSTERECTOMY    . BREAST SURGERY     cyst removed from left breast; benign  . CESAREAN SECTION N/A 03/20/2014   Procedure: CESAREAN SECTION;  Surgeon: Loney Laurence, MD;  Location: [facility];  Service: Obstetrics;  Laterality: N/A;  . cyst removed from L breast  10/2006   benign  . CYSTOSCOPY N/A 06/01/2017   Procedure: CYSTOSCOPY;  Surgeon: Carrington Clamp, MD;  Location: [facility];  Service: Gynecology;  Laterality: N/A;  . DILATION AND CURETTAGE OF UTERUS  08/1996, 12/1996   x 2 MAB  . DILATION AND CURETTAGE OF UTERUS    . KNEE ARTHROSCOPY Right 01/2016  . KNEE ARTHROSCOPY Right 08/2016  . live births  10/07/97, 02/08/2000   x2  . ROBOTIC ASSISTED TOTAL HYSTERECTOMY WITH SALPINGECTOMY Bilateral 06/01/2017   Procedure: ROBOTIC ASSISTED TOTAL HYSTERECTOMY WITH SALPINGECTOMY WITH PARTIAL REMOVAL OF HERNIA SAC;  Surgeon: Carrington Clamp, MD;  Location: [facility];  Service: Gynecology;  Laterality: Bilateral;  . TUBAL LIGATION Bilateral 03/20/2014   Procedure: BILATERAL TUBAL LIGATION;  Surgeon: Loney Laurence, MD;  Location: [facility];  Service: Obstetrics;  Laterality: Bilateral;  . UNILATERAL SALPINGECTOMY Right 03/20/2014    Procedure: UNILATERAL SALPINGECTOMY;  Surgeon: Loney Laurence, MD;  Location: [facility];  Service: Obstetrics;  Laterality: Right;    FAMILY HISTORY: Family History  Problem Relation Age of Onset  . Heart attack Father   . Hypertension Father   . Hyperlipidemia Father   . Asthma Son   . Heart disease Maternal Grandfather   . Cancer Paternal Grandmother        unsure what kind    SOCIAL HISTORY: Social History   Socioeconomic History  . Marital status: Married    Spouse name: Not on file  . Number of children: 3  . Years of education: 2.5 yrs college  . Highest education level: Not on file  Occupational History  . Occupation: Production designer, theatre/television/film. Rep    Employer: [employer]  Social Needs  . Financial resource strain: Not on file  . Food insecurity:    Worry: Not on file    Inability: Not on file  . Transportation needs:    Medical: Not on file    Non-medical: Not on file  Tobacco Use  .  Smoking status: Never Smoker  . Smokeless tobacco: Never Used  Substance and Sexual Activity  . Alcohol use: No  . Drug use: No  . Sexual activity: Yes    Birth control/protection: Surgical  Lifestyle  . Physical activity:    Days per week: Not on file    Minutes per session: Not on file  . Stress: Not on file  Relationships  . Social connections:    Talks on phone: Not on file    Gets together: Not on file    Attends religious service: Not on file    Active member of club or organization: Not on file    Attends meetings of clubs or organizations: Not on file    Relationship status: Not on file  . Intimate partner violence:    Fear of current or ex partner: Not on file    Emotionally abused: Not on file    Physically abused: Not on file    Forced sexual activity: Not on file  Other Topics Concern  . Not on file  Social History Narrative   Lives at home with husband and three children.   Left-handed.   No caffeine use.         PHYSICAL  EXAM  Vitals:   02/28/18 1453  BP: 125/83  Pulse: 65  Weight: 281 lb 3.2 oz (127.6 kg)  Height: 5\' 7"  (1.702 m)   Body mass index is 44.04 kg/m.  Generalized: Well developed, obese female in no acute distress  Head: normocephalic and atraumatic,. Oropharynx benign  Neck: Supple,   Musculoskeletal: No deformity   Neurological examination   Mentation: Alert oriented to time, place, history taking. Attention span and concentration appropriate. Recent and remote memory intact.  Follows all commands speech and language fluent.   Cranial nerve II-XII: Pupils were equal round reactive to light extraocular movements were full, visual field were full on confrontational test. Facial sensation and strength were normal. hearing was intact to finger rubbing bilaterally. Uvula tongue midline. head turning and shoulder shrug were normal and symmetric.Tongue protrusion into cheek strength was normal. Motor: normal bulk and tone, full strength in the BUE, BLE, Sensory: normal and symmetric to light touch,  Coordination: finger-nose-finger, symmetric upper and lower  no dysmetria Reflexes: Symmetric upper and lower plantar responses were flexor bilaterally. Gait and Station: Rising up from seated position without assistance, normal stance,  moderate stride, good arm swing, smooth turning, able to perform tiptoe, and heel walking without difficulty. Tandem gait is steady  DIAGNOSTIC DATA (LABS, IMAGING, TESTING) - I reviewed patient records, labs, notes, testing and imaging myself where available.  Lab Results  Component Value Date   WBC 10.7 (H) 11/21/2017   HGB 12.9 11/21/2017   HCT 38.0 11/21/2017   MCV 90.3 11/21/2017   PLT 397 11/21/2017      Component Value Date/Time   NA 139 11/21/2017 0303   K 4.1 11/21/2017 0303   CL 102 11/21/2017 0303   CO2 23 11/21/2017 0259   GLUCOSE 105 (H) 11/21/2017 0303   BUN 9 11/21/2017 0303   CREATININE 0.70 11/21/2017 0303   CALCIUM 8.8 (L)  11/21/2017 0259   PROT 6.1 (L) 11/21/2017 0259   ALBUMIN 3.3 (L) 11/21/2017 0259   AST 23 11/21/2017 0259   ALT 17 11/21/2017 0259   ALKPHOS 60 11/21/2017 0259   BILITOT 0.4 11/21/2017 0259   GFRNONAA >60 11/21/2017 0259   GFRAA >60 11/21/2017 0259   Lab Results  Component Value Date  CHOL  06/03/2010    135        ATP III CLASSIFICATION:  <200     mg/dL   Desirable  [id number]  mg/dL   Borderline High  >=956>=240    mg/dL   High          HDL 47 06/03/2010   LDLCALC  06/03/2010    49        Total Cholesterol/HDL:CHD Risk Coronary Heart Disease Risk Table                     Men   Women  1/2 Average Risk   3.4   3.3  Average Risk       5.0   4.4  2 X Average Risk   9.6   7.1  3 X Average Risk  23.4   11.0        Use the calculated Patient Ratio above and the CHD Risk Table to determine the patient's CHD Risk.        ATP III CLASSIFICATION (LDL):  <100     mg/dL   Optimal  [id number]  mg/dL   Near or Above                    Optimal  130-159  mg/dL   Borderline  [id number]  mg/dL   High  >629>190     mg/dL   Very High   TRIG 528194 (H) 06/03/2010   CHOLHDL 2.9 06/03/2010   Lab Results  Component Value Date   HGBA1C  06/03/2010    5.5 (NOTE)                                                                       According to the ADA Clinical Practice Recommendations for 2011, when HbA1c is used as a screening test:   >=6.5%   Diagnostic of Diabetes Mellitus           (if abnormal result  is confirmed)  5.7-6.4%   Increased risk of developing Diabetes Mellitus  References:Diagnosis and Classification of Diabetes Mellitus,Diabetes Care,2011,34(Suppl 1):S62-S69 and Standards of Medical Care in         Diabetes - 2011,Diabetes UXLK,4401,02Care,2011,34  (Suppl 1):S11-S61.    ASSESSMENT AND PLAN   Charlette CaffeyLois H Krogh is a 47 y.o. female with chronic migraine.   She has stopped her Topamax She has tried magnesium oxide, and riboflavin without helping her headache, she has stopped Topamax and is on atenolol  with improvement. tizanidine, Zofran, NSAIDs, for abortive treatment  Stop Maxalt restart Relpax 40mg  acutely Continue Atenolol as ordered.  F/U in 8 months Nilda RiggsNancy Carolyn Savahna Casados, [facility] HospitalGNP, [facility] HospitalBC, APRN  [facility] [address], [address] [place], [place] [address] [phone]

## 2018-02-28 ENCOUNTER — Ambulatory Visit: Payer: 59 | Admitting: Nurse Practitioner

## 2018-02-28 ENCOUNTER — Encounter: Payer: Self-pay | Admitting: Nurse Practitioner

## 2018-02-28 VITALS — BP 125/83 | HR 65 | Ht 67.0 in | Wt 281.2 lb

## 2018-02-28 DIAGNOSIS — G43709 Chronic migraine without aura, not intractable, without status migrainosus: Secondary | ICD-10-CM

## 2018-02-28 DIAGNOSIS — IMO0002 Reserved for concepts with insufficient information to code with codable children: Secondary | ICD-10-CM

## 2018-02-28 MED ORDER — ELETRIPTAN HYDROBROMIDE 40 MG PO TABS: 40.0000 mg | ORAL_TABLET | ORAL | 6 refills | Status: DC | PRN

## 2018-02-28 NOTE — Patient Instructions (Signed)
Relpax 40mg  acutely Continue Atenolol as ordered.  F/U in 8 months

## 2018-03-01 ENCOUNTER — Telehealth: Payer: Self-pay | Admitting: *Deleted

## 2018-03-01 NOTE — Telephone Encounter (Signed)
I called pt back and apologized for not relaying that there would be time delay in seeing her.  She was appreciative of call and stated that this was not necessary.

## 2018-03-01 NOTE — Telephone Encounter (Signed)
I called pt to apologize for placing in room and then not getting back with her to relay that CM was with another pt.  There was a delay on her appt. I LMVM that I would call her back.

## 2018-03-01 NOTE — Telephone Encounter (Signed)
Pt returning RN's call.

## 2018-03-06 NOTE — Progress Notes (Signed)
I have reviewed and agreed above plan. 

## 2018-04-08 ENCOUNTER — Other Ambulatory Visit: Payer: Self-pay

## 2018-04-08 ENCOUNTER — Encounter (HOSPITAL_COMMUNITY): Payer: Self-pay

## 2018-04-08 ENCOUNTER — Emergency Department (HOSPITAL_COMMUNITY)
Admission: EM | Admit: 2018-04-08 | Discharge: 2018-04-08 | Disposition: A | Discharge status: Home / Self Care | Payer: 59 | Attending: Emergency Medicine | Admitting: Emergency Medicine

## 2018-04-08 ENCOUNTER — Emergency Department (HOSPITAL_COMMUNITY): Payer: 59

## 2018-04-08 DIAGNOSIS — R079 Chest pain, unspecified: Secondary | ICD-10-CM | POA: Diagnosis present

## 2018-04-08 DIAGNOSIS — Z79899 Other long term (current) drug therapy: Secondary | ICD-10-CM | POA: Insufficient documentation

## 2018-04-08 LAB — CBC
HCT: 43.9 % (ref 36.0–46.0)
Hemoglobin: 14.3 g/dL (ref 12.0–15.0)
MCH: 29.5 pg (ref 26.0–34.0)
MCHC: 32.6 g/dL (ref 30.0–36.0)
MCV: 90.5 fL (ref 78.0–100.0)
PLATELETS: 408 10*3/uL — AB (ref 150–400)
RBC: 4.85 MIL/uL (ref 3.87–5.11)
RDW: 12.3 % (ref 11.5–15.5)
WBC: 11.3 10*3/uL — ABNORMAL HIGH (ref 4.0–10.5)

## 2018-04-08 LAB — BASIC METABOLIC PANEL
Anion gap: 9 (ref 5–15)
BUN: 11 mg/dL (ref 6–20)
CALCIUM: 10 mg/dL (ref 8.9–10.3)
CHLORIDE: 100 mmol/L (ref 98–111)
CO2: 30 mmol/L (ref 22–32)
CREATININE: 0.75 mg/dL (ref 0.44–1.00)
Glucose, Bld: 100 mg/dL — ABNORMAL HIGH (ref 70–99)
Potassium: 3.9 mmol/L (ref 3.5–5.1)
SODIUM: 139 mmol/L (ref 135–145)

## 2018-04-08 LAB — I-STAT TROPONIN, ED
TROPONIN I, POC: 0 ng/mL (ref 0.00–0.08)
Troponin i, poc: 0 ng/mL (ref 0.00–0.08)

## 2018-04-08 LAB — D-DIMER, QUANTITATIVE: D-Dimer, Quant: <0.27 ug/mL-FEU (ref 0.00–0.50)

## 2018-04-08 MED ORDER — ACETAMINOPHEN 500 MG PO TABS
1000.0000 mg | ORAL_TABLET | Freq: Once | ORAL | Status: AC
  Administered 2018-04-08: 1000 mg via ORAL
  Filled 2018-04-08: qty 2

## 2018-04-08 NOTE — ED Provider Notes (Signed)
[facility] EMERGENCY DEPARTMENT Provider Note   CSN: [id number] Arrival date & time: 04/08/18  1627     History   Chief Complaint Chief Complaint  Patient presents with  . Chest Pain    HPI Sheryl [name] is a 47 y.o. female with a past medical history of migraines who presents today for evaluation of chest pain.  Around 330pm she started having sudden onset left-sided chest pain radiating to her left arm and jaw.  Denies pain radiation into her back.  She was given 324 mg ASA prior to EMS arrival and reports that after about 10 minutes that started relieving her pain.  She was then given 1 nitro with EMS which took her pain from a 5 out of 10 to a 1 out of 10.  She denies any nausea or vomiting.  Does report that she felt chilled when her pain started.  Denies shortness of breath or abdominal pain.  No family history of sudden cardiac arrest before the age of 51.  She does not have a history of hypertension, diabetes, coronary artery disease, or blood clots.  Her husband states that he feels like she is zoned out for a few seconds while she was having pain, however she did not have loss of postural tone.  She does report that her left leg has been more swollen than her right and this is been present for over a year without answers found.  HPI  Past Medical History:  Diagnosis Date  . Allergic rhinitis   . Anemia   . Chronic migraine   . Migraines   . Miscarriage 08/1996, 12/1996   x2  . PONV (postoperative nausea and vomiting)   . SVD (spontaneous vaginal delivery)    x 2  . Vitamin D deficiency     Patient Active Problem List   Diagnosis Date Noted  . Chronic migraine 06/15/2017  . Postoperative state 03/20/2014  . Normal labor 03/19/2014  . Atypical chest pain 01/07/2012    Past Surgical History:  Procedure Laterality Date  . ABDOMINAL HYSTERECTOMY    . BREAST SURGERY     cyst removed from left breast; benign  . CESAREAN SECTION N/A 03/20/2014   Procedure: CESAREAN SECTION;  Surgeon: Loney Laurence, MD;  Location: [facility];  Service: Obstetrics;  Laterality: N/A;  . cyst removed from L breast  10/2006   benign  . CYSTOSCOPY N/A 06/01/2017   Procedure: CYSTOSCOPY;  Surgeon: Carrington Clamp, MD;  Location: [facility];  Service: Gynecology;  Laterality: N/A;  . DILATION AND CURETTAGE OF UTERUS  08/1996, 12/1996   x 2 MAB  . DILATION AND CURETTAGE OF UTERUS    . KNEE ARTHROSCOPY Right 01/2016  . KNEE ARTHROSCOPY Right 08/2016  . live births  10/07/97, 02/08/2000   x2  . ROBOTIC ASSISTED TOTAL HYSTERECTOMY WITH SALPINGECTOMY Bilateral 06/01/2017   Procedure: ROBOTIC ASSISTED TOTAL HYSTERECTOMY WITH SALPINGECTOMY WITH PARTIAL REMOVAL OF HERNIA SAC;  Surgeon: Carrington Clamp, MD;  Location: [facility];  Service: Gynecology;  Laterality: Bilateral;  . TUBAL LIGATION Bilateral 03/20/2014   Procedure: BILATERAL TUBAL LIGATION;  Surgeon: Loney Laurence, MD;  Location: [facility];  Service: Obstetrics;  Laterality: Bilateral;  . UNILATERAL SALPINGECTOMY Right 03/20/2014   Procedure: UNILATERAL SALPINGECTOMY;  Surgeon: Loney Laurence, MD;  Location: [facility];  Service: Obstetrics;  Laterality: Right;     OB History    Gravida  5   Para  3   Term  1  Preterm  2   AB  2   Living  3     SAB  2   TAB  0   Ectopic  0   Multiple      Live Births  1            Home Medications    Prior to Admission medications   Medication Sig Start Date End Date Taking? Authorizing Provider  acetaminophen (TYLENOL) 500 MG tablet Take 500-1,000 mg by mouth every 6 (six) hours as needed (for pain/headaches.).   Yes [provider]  atenolol (TENORMIN) 25 MG tablet Take 1 tablet (25 mg total) by mouth daily. 11/01/17  Yes Levert Feinstein, MD  cetirizine (ZYRTEC) 10 MG tablet Take 10 mg by mouth daily as needed for allergies ((hay fever/pollen)).    Yes [provider]  eletriptan (RELPAX) 40 MG tablet Take 1 tablet (40 mg total) by  mouth as needed for migraine or headache. May repeat in 2 hours if headache persists or recurs. 02/28/18  Yes Nilda Riggs, NP  Multiple Vitamin (MULTIVITAMIN WITH MINERALS) TABS tablet Take 1 tablet by mouth daily.   Yes [provider]  ondansetron (ZOFRAN ODT) 4 MG disintegrating tablet Take 1 tablet (4 mg total) by mouth every 8 (eight) hours as needed. 06/15/17  Yes Levert Feinstein, MD  tiZANidine (ZANAFLEX) 4 MG tablet Take 1 tablet (4 mg total) by mouth every 6 (six) hours as needed for muscle spasms. Patient not taking: Reported on 04/08/2018 10/27/17   Levert Feinstein, MD    Family History Family History  Problem Relation Age of Onset  . Heart attack Father   . Hypertension Father   . Hyperlipidemia Father   . Asthma Son   . Heart disease Maternal Grandfather   . Cancer Paternal Grandmother        unsure what kind    Social History Social History   Tobacco Use  . Smoking status: Never Smoker  . Smokeless tobacco: Never Used  Substance Use Topics  . Alcohol use: No  . Drug use: No     Allergies   Advil [ibuprofen] and Salicylates   Review of Systems Review of Systems  Constitutional: Positive for chills. Negative for fever.  HENT: Negative for congestion.   Eyes: Negative for visual disturbance.  Respiratory: Positive for chest tightness. Negative for shortness of breath.   Cardiovascular: Positive for chest pain and leg swelling. Negative for palpitations.  Gastrointestinal: Negative for abdominal pain.  Neurological: Negative for dizziness, syncope, light-headedness and headaches.  All other systems reviewed and are negative.    Physical Exam Updated Vital Signs BP (!) 124/91   Pulse 63   Temp 97.9 F (36.6 C) (Oral)   Resp 18   Ht 5\' 8"  (1.727 m)   Wt 127.5 kg (281 lb)   LMP 05/26/2017 (Exact Date)   SpO2 98%   BMI 42.73 kg/m   Physical Exam  Constitutional: She is oriented to person, place, and time. She appears well-developed and  well-nourished.  Non-toxic appearance. No distress.  HENT:  Head: Normocephalic and atraumatic.  Mouth/Throat: Oropharynx is clear and moist.  Eyes: Conjunctivae are normal.  Neck: Neck supple. No JVD present. No tracheal deviation present.  Cardiovascular: Normal rate, regular rhythm, normal heart sounds, intact distal pulses and normal pulses.  No murmur heard. Pulses:      Radial pulses are 2+ on the right side, and 2+ on the left side.  Dorsalis pedis pulses are 2+ on the right side, and 2+ on the left side.       Posterior tibial pulses are 2+ on the right side, and 2+ on the left side.  Pulmonary/Chest: Effort normal and breath sounds normal. No respiratory distress. She has no decreased breath sounds. She has no wheezes. She has no rhonchi.  Abdominal: Soft. Bowel sounds are normal. She exhibits no distension. There is no tenderness. There is no guarding.  Musculoskeletal: Normal range of motion. She exhibits no edema.       Right lower leg: Normal. She exhibits no edema.       Left lower leg: Normal. She exhibits no edema.  Neurological: She is alert and oriented to person, place, and time.  Skin: Skin is warm and dry.  Psychiatric: She has a normal mood and affect. Her behavior is normal.  Nursing note and vitals reviewed.    ED Treatments / Results  Labs (all labs ordered are listed, but only abnormal results are displayed) Labs Reviewed  BASIC METABOLIC PANEL - Abnormal; Notable for the following components:      Result Value   Glucose, Bld 100 (*)    All other components within normal limits  CBC - Abnormal; Notable for the following components:   WBC 11.3 (*)    Platelets 408 (*)    All other components within normal limits  D-DIMER, QUANTITATIVE (NOT AT [facility])  I-STAT TROPONIN, ED  I-STAT TROPONIN, ED    EKG EKG Interpretation  Date/Time:  Saturday April 08 2018 16:36:17 EDT Ventricular Rate:  60 PR Interval:    QRS Duration: 96 QT  Interval:  464 QTC Calculation: 464 R Axis:   71 Text Interpretation:  Sinus rhythm no change from previous Confirmed by Arby Barrette [phone]) on 04/08/2018 10:12:01 PM   Radiology Dg Chest 2 View  Result Date: 04/08/2018 CLINICAL DATA:  Pt reports left side chest pain/tightness, jaw pain, left arm pain, nausea, and SOB onset today; non-smoker EXAM: CHEST - 2 VIEW COMPARISON:  None. FINDINGS: The heart size and mediastinal contours are within normal limits. Both lungs are clear. The visualized skeletal structures are unremarkable. IMPRESSION: No active cardiopulmonary disease. Electronically Signed   By: Norva Pavlov M.D.   On: 04/08/2018 16:59    Procedures Procedures (including critical care time)  Medications Ordered in ED Medications  acetaminophen (TYLENOL) tablet 1,000 mg (1,000 mg Oral Given 04/08/18 2019)     Initial Impression / Assessment and Plan / ED Course  I have reviewed the triage vital signs and the nursing notes.  Pertinent labs & imaging results that were available during my care of the patient were reviewed by me and considered in my medical decision making (see chart for details).    Patient is to be discharged with recommendation to follow up with PCP in regards to today's hospital visit. Chest pain is not likely of cardiac or pulmonary etiology d/t presentation, d-dimer negative, VSS, no tracheal deviation, no JVD or new murmur, RRR, breath sounds equal bilaterally, EKG without acute abnormalities, negative troponin x2 , and negative CXR. Pt has been advised to return to the ED if CP returns, she develops shortness of breath, fevers, diaphoresis, nausea, or any other concerning symptoms. Pt appears reliable for follow up and is agreeable to discharge.   Patient's request she will be given cardiology follow-up.  She is currently symptom free.   Case has been discussed with Dr. Clarice Pole who agrees with the above plan  to discharge.   Patient was given return  precautions, states her understanding.  She was given the option to ask questions, all of which were answered to the best my abilities.  She denies any unaddressed complaints or concerns today and states she is ready to go home.   Final Clinical Impressions(s) / ED Diagnoses   Final diagnoses:  Chest pain, unspecified type    ED Discharge Orders    None       Norman ClayHammond, Buckley Bradly W, PA-C 04/08/18 2245    Arby BarrettePfeiffer, Marcy, MD 04/10/18 [id number]

## 2018-04-08 NOTE — ED Notes (Signed)
PT states understanding of care given, follow up care. PT ambulated from ED to car with a steady gait.  

## 2018-04-08 NOTE — Discharge Instructions (Signed)
Today your heart enzymes were normal.  As discussed this indicates that you are most likely not having a heart attack today.  Please return to the emergency room if you have chest pain, shortness of breath, or any concerns.  Please schedule an appointment with cardology.  Please see your primary care doctor on Monday or Tuesday.

## 2018-04-08 NOTE — ED Triage Notes (Signed)
Pt from home with ems for sudden onset L sided chest pain that radiates to back and jaw. Pt took 324 ASA PTA, 1 Nitro with ems. Pain went from 5/10 to 1/10 after nitro. 12 lead shows NSR in 60s, no cardiac hx. VSS

## 2018-04-08 NOTE — ED Notes (Signed)
Attempted to obtain labs X2 without success. Phlebotomy notified of need for them to draw blood.

## 2018-04-10 NOTE — H&P (View-Only) (Signed)
Cardiology Office Note   Date:  04/11/2018   ID:  Sheryl [name], DOB 1971-08-16, MRN [id number]  PCP:  Antony Contras, MD  Cardiologist:   Jenkins Rouge, MD   No chief complaint on file.     History of Present Illness: Sheryl [name] is a 47 y.o. female who presents for consultation regarding chest pain Referred by ER PA Wyn Quaker Reviewed ER note from 04/08/18 Sudden onset of left sided chest pian radiating to left arm and jaw. Some relief with ASA given by EMS then given nitro and pain reduced further No coronary artery risk factors History of migraines  Husband felt she lost attention for a brief moment. Has had some LLE edema last year Negative w/u in ER with CXR NAD, troponin negative x 2 negative D dimer and normal ECG  Had uncomplicated hysterectomy 06/02/17 for menorrhagia under general anesthesia  Continues to have exertional pain. Has not taken nitro at home pain does radiate to arm and jaw. 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 Favor diagnostic cath as gold standard to r/o CAD She is particularly concerned as her father died at [facility] 07-12-23 after multiple MI's and was cared for by Dr Burt Knack  Risks including stroke bleeding contrast reaction need for immediate surgery willing to proceed   Past Medical History:  Diagnosis Date  . Allergic rhinitis   . Anemia   . Atypical chest pain 01/07/2012   Ct chest 10/2011:  No acute process, no PE to midsized pulmonary vessels.    . Chronic migraine   . Migraines   . Miscarriage 08/1996, 12/1996   x2  . Normal labor 03/19/2014  . PONV (postoperative nausea and vomiting)   . Postoperative state 03/20/2014  . SVD (spontaneous vaginal delivery)    x 2  . Vitamin D deficiency     Past Surgical History:  Procedure  Laterality Date  . ABDOMINAL HYSTERECTOMY    . BREAST SURGERY     cyst removed from left breast; benign  . CESAREAN SECTION N/A 03/20/2014   Procedure: CESAREAN SECTION;  Surgeon: Daria Pastures, MD;  Location: [place] [facility];  Service: Obstetrics;  Laterality: N/A;  . cyst removed from L breast  10/2006   benign  . CYSTOSCOPY N/A 06/01/2017   Procedure: CYSTOSCOPY;  Surgeon: Bobbye Charleston, MD;  Location: [place] [facility];  Service: Gynecology;  Laterality: N/A;  . DILATION AND CURETTAGE OF UTERUS  08/1996, 12/1996   x 2 MAB  . DILATION AND CURETTAGE OF UTERUS    . KNEE ARTHROSCOPY Right 01/2016  . KNEE ARTHROSCOPY Right 08/2016  . live births  10/07/97, 02/08/2000   x2  . ROBOTIC ASSISTED TOTAL HYSTERECTOMY WITH SALPINGECTOMY Bilateral 06/01/2017   Procedure: ROBOTIC ASSISTED TOTAL HYSTERECTOMY WITH SALPINGECTOMY WITH PARTIAL REMOVAL OF HERNIA [place];  Surgeon: Bobbye Charleston, MD;  Location: [facility];  Service: Gynecology;  Laterality: Bilateral;  . TUBAL LIGATION Bilateral 03/20/2014   Procedure: BILATERAL TUBAL LIGATION;  Surgeon: Daria Pastures, MD;  Location: [place] [facility];  Service: Obstetrics;  Laterality: Bilateral;  . UNILATERAL SALPINGECTOMY Right 03/20/2014   Procedure: UNILATERAL SALPINGECTOMY;  Surgeon: Daria Pastures, MD;  Location: [place] [facility];  Service: Obstetrics;  Laterality: Right;     Current Outpatient Medications  Medication Sig Dispense  Refill  . acetaminophen (TYLENOL) 500 MG tablet Take 500-1,000 mg by mouth every 6 (six) hours as needed (for pain/headaches.).    Marland Kitchen atenolol (TENORMIN) 25 MG tablet Take 1 tablet (25 mg total) by mouth daily. 30 tablet 11  . cetirizine (ZYRTEC) 10 MG tablet Take 10 mg by mouth daily as needed for allergies ((hay fever/pollen)).     Marland Kitchen eletriptan (RELPAX) 40 MG tablet Take 1 tablet (40 mg total) by mouth as needed for migraine or headache. May repeat in 2 hours if headache persists or recurs. 4 tablet 6  . Multiple Vitamin (MULTIVITAMIN WITH MINERALS) TABS  tablet Take 1 tablet by mouth daily.    . ondansetron (ZOFRAN ODT) 4 MG disintegrating tablet Take 1 tablet (4 mg total) by mouth every 8 (eight) hours as needed. 20 tablet 11  . tiZANidine (ZANAFLEX) 4 MG tablet Take 1 tablet (4 mg total) by mouth every 6 (six) hours as needed for muscle spasms. 30 tablet 6   No current facility-administered medications for this visit.     Allergies:   Advil [ibuprofen] and Salicylates    Social History:  The patient  reports that she has never smoked. She has never used smokeless tobacco. She reports that she does not drink alcohol or use drugs.   Family History:  The patient's family history includes Asthma in her son; Cancer in her paternal grandmother; Heart attack in her father; Heart disease in her maternal grandfather; Hyperlipidemia in her father; Hypertension in her father.    ROS:  Please see the history of present illness.   Otherwise, review of systems are positive for none.   All other systems are reviewed and negative.    PHYSICAL EXAM: VS:  BP 118/80   Pulse 77   Ht _0  (1.727 m)   Wt 280 lb 4 oz (127.1 kg)   LMP 05/26/2017 (Exact Date)   SpO2 97%   BMI 42.61 kg/m  , BMI Body mass index is 42.61 kg/m. Affect appropriate Healthy:  appears stated age 21: normal Neck supple with no adenopathy JVP normal no bruits no thyromegaly Lungs clear with no wheezing and good diaphragmatic motion Heart:  S1/S2 no murmur, no rub, gallop or click PMI normal Abdomen: benighn, BS positve, no tenderness, no AAA no bruit.  No HSM or HJR Distal pulses intact with no bruits No edema Neuro non-focal Skin warm and dry No muscular weakness    EKG:  NSR rate 60 normal 04/09/18    Recent Labs: 11/21/2017: ALT 17 04/08/2018: BUN 11; Creatinine, Ser 0.75; Hemoglobin 14.3; Platelets 408; Potassium 3.9; Sodium 139    Lipid Panel    Component Value Date/Time   CHOL  06/03/2010 0453    135        ATP III CLASSIFICATION:  <200     mg/dL    Desirable  200-239  mg/dL   Borderline High  >=240    mg/dL   High          TRIG 194 (H) 06/03/2010 0453   HDL 47 06/03/2010 0453   CHOLHDL 2.9 06/03/2010 0453   VLDL 39 06/03/2010 0453   LDLCALC  06/03/2010 0453    49        Total Cholesterol/HDL:CHD Risk Coronary Heart Disease Risk Table                     Men   Women  1/2 Average Risk   3.4   3.3  Average  Risk       5.0   4.4  2 X Average Risk   9.6   7.1  3 X Average Risk  23.4   11.0        Use the calculated Patient Ratio above and the CHD Risk Table to determine the patient's CHD Risk.        ATP III CLASSIFICATION (LDL):  <100     mg/dL   Optimal  100-129  mg/dL   Near or Above                    Optimal  130-159  mg/dL   Borderline  160-189  mg/dL   High  >190     mg/dL   Very High      Wt Readings from Last 3 Encounters:  04/11/18 280 lb 4 oz (127.1 kg)  04/08/18 281 lb (127.5 kg)  02/28/18 281 lb 3.2 oz (127.6 kg)      Other studies Reviewed: Additional studies/ records that were reviewed today include: Notes from ER , labs ,ECG and CXR.    ASSESSMENT AND PLAN:  1.  Chest Pain: new onset responsive to nitro diagnostic cath arranged with with Dr Irish Lack this week labs today On atenolol for migraines already SL nitro called in  2.  Hypertriglyceridemia:  Discussed low fat diet may need Rx if found to have CAD 3. Migraines:  Continue beta blocker f/u neurology puts her at higher risk for potential coronary spasm    Current medicines are reviewed at length with the patient today.  The patient does not have concerns regarding medicines.  The following changes have been made:  SL nitro   Labs/ tests ordered today include: Pre Cath  No orders of the defined types were placed in this encounter.    Disposition:   FU with cardiology post cath     Signed, Jenkins Rouge, MD  04/11/2018 3:24 PM    [facility] [address], [place], [place]  [address] Phone: [phone]; Fax:  [phone]

## 2018-04-10 NOTE — Progress Notes (Signed)
Cardiology Office Note   Date:  04/11/2018   ID:  Sheryl [name], DOB 1971-08-16, MRN [id number]  PCP:  Antony Contras, MD  Cardiologist:   Jenkins Rouge, MD   No chief complaint on file.     History of Present Illness: Sheryl [name] is a 47 y.o. female who presents for consultation regarding chest pain Referred by ER PA Wyn Quaker Reviewed ER note from 04/08/18 Sudden onset of left sided chest pian radiating to left arm and jaw. Some relief with ASA given by EMS then given nitro and pain reduced further No coronary artery risk factors History of migraines  Husband felt she lost attention for a brief moment. Has had some LLE edema last year Negative w/u in ER with CXR NAD, troponin negative x 2 negative D dimer and normal ECG  Had uncomplicated hysterectomy 06/02/17 for menorrhagia under general anesthesia  Continues to have exertional pain. Has not taken nitro at home pain does radiate to arm and jaw. 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 Favor diagnostic cath as gold standard to r/o CAD She is particularly concerned as her father died at [facility] 07-12-23 after multiple MI's and was cared for by Dr Burt Knack  Risks including stroke bleeding contrast reaction need for immediate surgery willing to proceed   Past Medical History:  Diagnosis Date  . Allergic rhinitis   . Anemia   . Atypical chest pain 01/07/2012   Ct chest 10/2011:  No acute process, no PE to midsized pulmonary vessels.    . Chronic migraine   . Migraines   . Miscarriage 08/1996, 12/1996   x2  . Normal labor 03/19/2014  . PONV (postoperative nausea and vomiting)   . Postoperative state 03/20/2014  . SVD (spontaneous vaginal delivery)    x 2  . Vitamin D deficiency     Past Surgical History:  Procedure  Laterality Date  . ABDOMINAL HYSTERECTOMY    . BREAST SURGERY     cyst removed from left breast; benign  . CESAREAN SECTION N/A 03/20/2014   Procedure: CESAREAN SECTION;  Surgeon: Daria Pastures, MD;  Location: [facility];  Service: Obstetrics;  Laterality: N/A;  . cyst removed from L breast  10/2006   benign  . CYSTOSCOPY N/A 06/01/2017   Procedure: CYSTOSCOPY;  Surgeon: Bobbye Charleston, MD;  Location: [facility];  Service: Gynecology;  Laterality: N/A;  . DILATION AND CURETTAGE OF UTERUS  08/1996, 12/1996   x 2 MAB  . DILATION AND CURETTAGE OF UTERUS    . KNEE ARTHROSCOPY Right 01/2016  . KNEE ARTHROSCOPY Right 08/2016  . live births  10/07/97, 02/08/2000   x2  . ROBOTIC ASSISTED TOTAL HYSTERECTOMY WITH SALPINGECTOMY Bilateral 06/01/2017   Procedure: ROBOTIC ASSISTED TOTAL HYSTERECTOMY WITH SALPINGECTOMY WITH PARTIAL REMOVAL OF HERNIA [place];  Surgeon: Bobbye Charleston, MD;  Location: [facility];  Service: Gynecology;  Laterality: Bilateral;  . TUBAL LIGATION Bilateral 03/20/2014   Procedure: BILATERAL TUBAL LIGATION;  Surgeon: Daria Pastures, MD;  Location: [facility];  Service: Obstetrics;  Laterality: Bilateral;  . UNILATERAL SALPINGECTOMY Right 03/20/2014   Procedure: UNILATERAL SALPINGECTOMY;  Surgeon: Daria Pastures, MD;  Location: [facility];  Service: Obstetrics;  Laterality: Right;     Current Outpatient Medications  Medication Sig Dispense  Refill  . acetaminophen (TYLENOL) 500 MG tablet Take 500-1,000 mg by mouth every 6 (six) hours as needed (for pain/headaches.).    Marland Kitchen atenolol (TENORMIN) 25 MG tablet Take 1 tablet (25 mg total) by mouth daily. 30 tablet 11  . cetirizine (ZYRTEC) 10 MG tablet Take 10 mg by mouth daily as needed for allergies ((hay fever/pollen)).     Marland Kitchen eletriptan (RELPAX) 40 MG tablet Take 1 tablet (40 mg total) by mouth as needed for migraine or headache. May repeat in 2 hours if headache persists or recurs. 4 tablet 6  . Multiple Vitamin (MULTIVITAMIN WITH MINERALS) TABS  tablet Take 1 tablet by mouth daily.    . ondansetron (ZOFRAN ODT) 4 MG disintegrating tablet Take 1 tablet (4 mg total) by mouth every 8 (eight) hours as needed. 20 tablet 11  . tiZANidine (ZANAFLEX) 4 MG tablet Take 1 tablet (4 mg total) by mouth every 6 (six) hours as needed for muscle spasms. 30 tablet 6   No current facility-administered medications for this visit.     Allergies:   Advil [ibuprofen] and Salicylates    Social History:  The patient  reports that she has never smoked. She has never used smokeless tobacco. She reports that she does not drink alcohol or use drugs.   Family History:  The patient's family history includes Asthma in her son; Cancer in her paternal grandmother; Heart attack in her father; Heart disease in her maternal grandfather; Hyperlipidemia in her father; Hypertension in her father.    ROS:  Please see the history of present illness.   Otherwise, review of systems are positive for none.   All other systems are reviewed and negative.    PHYSICAL EXAM: VS:  BP 118/80   Pulse 77   Ht _0  (1.727 m)   Wt 280 lb 4 oz (127.1 kg)   LMP 05/26/2017 (Exact Date)   SpO2 97%   BMI 42.61 kg/m  , BMI Body mass index is 42.61 kg/m. Affect appropriate Healthy:  appears stated age 21: normal Neck supple with no adenopathy JVP normal no bruits no thyromegaly Lungs clear with no wheezing and good diaphragmatic motion Heart:  S1/S2 no murmur, no rub, gallop or click PMI normal Abdomen: benighn, BS positve, no tenderness, no AAA no bruit.  No HSM or HJR Distal pulses intact with no bruits No edema Neuro non-focal Skin warm and dry No muscular weakness    EKG:  NSR rate 60 normal 04/09/18    Recent Labs: 11/21/2017: ALT 17 04/08/2018: BUN 11; Creatinine, Ser 0.75; Hemoglobin 14.3; Platelets 408; Potassium 3.9; Sodium 139    Lipid Panel    Component Value Date/Time   CHOL  06/03/2010 0453    135        ATP III CLASSIFICATION:  <200     mg/dL    Desirable  200-239  mg/dL   Borderline High  >=240    mg/dL   High          TRIG 194 (H) 06/03/2010 0453   HDL 47 06/03/2010 0453   CHOLHDL 2.9 06/03/2010 0453   VLDL 39 06/03/2010 0453   LDLCALC  06/03/2010 0453    49        Total Cholesterol/HDL:CHD Risk Coronary Heart Disease Risk Table                     Men   Women  1/2 Average Risk   3.4   3.3  Average  Risk       5.0   4.4  2 X Average Risk   9.6   7.1  3 X Average Risk  23.4   11.0        Use the calculated Patient Ratio above and the CHD Risk Table to determine the patient's CHD Risk.        ATP III CLASSIFICATION (LDL):  <100     mg/dL   Optimal  100-129  mg/dL   Near or Above                    Optimal  130-159  mg/dL   Borderline  160-189  mg/dL   High  >190     mg/dL   Very High      Wt Readings from Last 3 Encounters:  04/11/18 280 lb 4 oz (127.1 kg)  04/08/18 281 lb (127.5 kg)  02/28/18 281 lb 3.2 oz (127.6 kg)      Other studies Reviewed: Additional studies/ records that were reviewed today include: Notes from ER , labs ,ECG and CXR.    ASSESSMENT AND PLAN:  1.  Chest Pain: new onset responsive to nitro diagnostic cath arranged with with Dr Irish Lack this week labs today On atenolol for migraines already SL nitro called in  2.  Hypertriglyceridemia:  Discussed low fat diet may need Rx if found to have CAD 3. Migraines:  Continue beta blocker f/u neurology puts her at higher risk for potential coronary spasm    Current medicines are reviewed at length with the patient today.  The patient does not have concerns regarding medicines.  The following changes have been made:  SL nitro   Labs/ tests ordered today include: Pre Cath  No orders of the defined types were placed in this encounter.    Disposition:   FU with cardiology post cath     Signed, Jenkins Rouge, MD  04/11/2018 3:24 PM    [facility] [address], [place], [place]  [address] Phone: [phone]; Fax:  [phone]

## 2018-04-11 ENCOUNTER — Encounter: Payer: Self-pay | Admitting: Cardiovascular Disease

## 2018-04-11 ENCOUNTER — Telehealth: Payer: Self-pay

## 2018-04-11 ENCOUNTER — Ambulatory Visit: Payer: 59 | Admitting: Cardiovascular Disease

## 2018-04-11 VITALS — BP 118/80 | HR 77 | Ht 68.0 in | Wt 280.2 lb

## 2018-04-11 DIAGNOSIS — R079 Chest pain, unspecified: Secondary | ICD-10-CM

## 2018-04-11 DIAGNOSIS — E781 Pure hyperglyceridemia: Secondary | ICD-10-CM | POA: Diagnosis not present

## 2018-04-11 MED ORDER — NITROGLYCERIN 0.4 MG SL SUBL: 0.4000 mg | SUBLINGUAL_TABLET | SUBLINGUAL | 3 refills | Status: DC | PRN

## 2018-04-11 NOTE — Telephone Encounter (Deleted)
   [facility] Pre-operative Risk Assessment    Request for surgical clearance:  1. What type of surgery is being performed? ***   2. When is this surgery scheduled? ***   3. What type of clearance is required (medical clearance vs. Pharmacy clearance to hold med vs. Both)? ***  4. Are there any medications that need to be held prior to surgery and how long?***   5. Practice name and name of physician performing surgery? ***   6. What is your office phone number***    7.   What is your office fax number***  8.   Anesthesia type (None, local, MAC, general) ? ***   Joaquim Lai 04/11/2018, 2:51 PM  _________________________________________________________________   (provider comments below)

## 2018-04-11 NOTE — Patient Instructions (Addendum)
Medication Instructions:  Your physician has recommended you make the following change in your medication:   Take 1 nitroglycerin under your tongue, while sitting for chest pain. If no relief of pain may repeat NTG, one tab every 5 minutes up to 3 tablets total over 15 minutes. If no relief CALL 911. If you have dizziness/lightheadness while taking NTG, stop taking and call 911.  Labwork: Your physician recommends that you have lab work today- BMET and CBC  Testing/Procedures: NONE  Follow-Up: Your physician wants you to follow-up in 2 weeks with PA or NP.   If you need a refill on your cardiac medications before your next appointment, please call your pharmacy.      [facility] [address], [address] [place] [address] Dept: [phone] Loc: [phone]  Sheryl CaffeyLois H [name]  04/11/2018  You are scheduled for a Cardiac Catheterization on Friday, August 9 with Dr. Cristal Deerhristopher End.  1. Please arrive [facility] the [facility]: [address] [place], [place] [address] at 8:00 AM (This time is two hours before your procedure to ensure your preparation). Free valet parking service is available.   Special note: Every effort is made to have your procedure done on time. Please understand that emergencies sometimes delay scheduled procedures.  2. Diet: Do not eat solid foods after midnight.  The patient may have clear liquids until 5am upon the day of the procedure.  3. Labs: You will need to have blood drawn on Tuesday, August 6 [facility] [address], [place]  Open: 7:30am - 5pm    Phone: [phone]. You do not need to be fasting.  4. Medication instructions in preparation for your procedure:   Contrast Allergy: No  On the morning of your procedure, take your Aspirin and any morning medicines NOT listed above.  You  may use sips of water.  5. Plan for one night stay--bring personal belongings. 6. Bring a current list of your medications and current insurance cards. 7. You MUST have a responsible person to drive you home. 8. Someone MUST be with you the first 24 hours after you arrive home or your discharge will be delayed. 9. Please wear clothes that are easy to get on and off and wear slip-on shoes.  Thank you for allowing us to care for you!   -- [name]

## 2018-04-12 ENCOUNTER — Telehealth: Payer: Self-pay

## 2018-04-12 DIAGNOSIS — D72829 Elevated white blood cell count, unspecified: Secondary | ICD-10-CM

## 2018-04-12 LAB — CBC WITH DIFFERENTIAL/PLATELET
BASOS ABS: 0 10*3/uL (ref 0.0–0.2)
Basos: 0 %
EOS (ABSOLUTE): 0.3 10*3/uL (ref 0.0–0.4)
Eos: 2 %
Hematocrit: 40.9 % (ref 34.0–46.6)
Hemoglobin: 13.9 g/dL (ref 11.1–15.9)
IMMATURE GRANS (ABS): 0 10*3/uL (ref 0.0–0.1)
Immature Granulocytes: 0 %
LYMPHS: 19 %
Lymphocytes Absolute: 2.6 10*3/uL (ref 0.7–3.1)
MCH: 30 pg (ref 26.6–33.0)
MCHC: 34 g/dL (ref 31.5–35.7)
MCV: 88 fL (ref 79–97)
Monocytes Absolute: 1.2 10*3/uL — ABNORMAL HIGH (ref 0.1–0.9)
Monocytes: 9 %
NEUTROS ABS: 9.5 10*3/uL — AB (ref 1.4–7.0)
Neutrophils: 70 %
PLATELETS: 426 10*3/uL (ref 150–450)
RBC: 4.63 x10E6/uL (ref 3.77–5.28)
RDW: 13.2 % (ref 12.3–15.4)
WBC: 13.6 10*3/uL — AB (ref 3.4–10.8)

## 2018-04-12 LAB — BASIC METABOLIC PANEL
BUN / CREAT RATIO: 15 (ref 9–23)
BUN: 9 mg/dL (ref 6–24)
CHLORIDE: 101 mmol/L (ref 96–106)
CO2: 26 mmol/L (ref 20–29)
Calcium: 9.3 mg/dL (ref 8.7–10.2)
Creatinine, Ser: 0.61 mg/dL (ref 0.57–1.00)
GFR calc Af Amer: 126 mL/min/{1.73_m2} (ref >59)
GFR calc non Af Amer: 109 mL/min/{1.73_m2} (ref >59)
Glucose: 83 mg/dL (ref 65–99)
Potassium: 4.6 mmol/L (ref 3.5–5.2)
SODIUM: 144 mmol/L (ref 134–144)

## 2018-04-12 NOTE — Telephone Encounter (Signed)
-----   Message from Wendall StadePeter C Nishan, MD sent at 04/12/2018  8:59 AM EDT ----- wBC up check UA repeat in 2 weeks post cath

## 2018-04-12 NOTE — Telephone Encounter (Signed)
Called patient about lab results. Per Dr. Eden EmmsNishan, WBC up, check UA, repeat  In 2 weeks post cath. Patient verbalized understanding. Patient will go to [facility] for [facility] and she will have repeat lab work on her return visit to our office.

## 2018-04-13 ENCOUNTER — Other Ambulatory Visit: Payer: Self-pay | Admitting: Cardiovascular Disease

## 2018-04-13 ENCOUNTER — Telehealth: Payer: Self-pay | Admitting: Cardiovascular Disease

## 2018-04-13 ENCOUNTER — Telehealth: Payer: Self-pay | Admitting: *Deleted

## 2018-04-13 LAB — URINALYSIS
Bilirubin, UA: NEGATIVE
Glucose, UA: NEGATIVE
Ketones, UA: NEGATIVE
LEUKOCYTES UA: NEGATIVE
NITRITE UA: NEGATIVE
Protein, UA: NEGATIVE
Specific Gravity, UA: 1.01 (ref 1.005–1.030)
UUROB: 0.2 mg/dL (ref 0.2–1.0)
pH, UA: 6 (ref 5.0–7.5)

## 2018-04-13 NOTE — Telephone Encounter (Signed)
New message   Patient states that she is having abdomen pain because her white blood cell count is up. We did the Urinalysis. Patient is wanting to know what can be done for her abdominal pain.

## 2018-04-13 NOTE — Telephone Encounter (Signed)
Pt contacted [facility]terization [facility] for: Friday April 14, 2018 10:30 AM Verified [facility] and [facility] at: 8AM  No solid food after midnight prior to cath, clear liquids until 5 AM day of procedure. Verified allergies in Epic Verified no diabetes medications.  AM meds can be  taken pre-cath with sip of water including: ASA 81 mg--pt states she has taken aspirin in the past and tolerated it without any symptoms, including allergic reaction.   Confirmed patient has responsible person to drive home post procedure and for 24 hours after you arrive home: yes  04/11/18-WBC 13.6, 04/12/18 UA done-pt states she has been having some abdominal pain and had some diarrhea yesterday. Pt states she is feeling better today, abdominal pain better, no diarrhea today, she is tolerating liquids, she denies any URI symptoms including cough. Pt encouraged to drink plenty of fluids prior to procedure tomorrow, she verbalized understanding.

## 2018-04-13 NOTE — Telephone Encounter (Signed)
Called patient back about message. Informed patient that she would need to follow up with her PCP about the abdominal pain, since UA did not really show anything and her WBC is up. Patient stated she would call her PCP.

## 2018-04-14 ENCOUNTER — Other Ambulatory Visit: Payer: Self-pay

## 2018-04-14 ENCOUNTER — Ambulatory Visit (HOSPITAL_COMMUNITY)
Admission: RE | Admit: 2018-04-14 | Discharge: 2018-04-14 | Disposition: A | Discharge status: Home / Self Care | Payer: 59 | Source: Ambulatory Visit | Attending: Internal Medicine | Admitting: Internal Medicine

## 2018-04-14 ENCOUNTER — Encounter (HOSPITAL_COMMUNITY)
Admission: RE | Disposition: A | Discharge status: Home / Self Care | Payer: Self-pay | Source: Ambulatory Visit | Attending: Internal Medicine

## 2018-04-14 ENCOUNTER — Emergency Department (HOSPITAL_COMMUNITY)
Admission: EM | Admit: 2018-04-14 | Discharge: 2018-04-14 | Disposition: A | Discharge status: Home / Self Care | Payer: 59 | Source: Home / Self Care | Attending: Emergency Medicine | Admitting: Emergency Medicine

## 2018-04-14 DIAGNOSIS — D649 Anemia, unspecified: Secondary | ICD-10-CM | POA: Diagnosis not present

## 2018-04-14 DIAGNOSIS — E669 Obesity, unspecified: Secondary | ICD-10-CM | POA: Diagnosis not present

## 2018-04-14 DIAGNOSIS — Z79899 Other long term (current) drug therapy: Secondary | ICD-10-CM | POA: Insufficient documentation

## 2018-04-14 DIAGNOSIS — R0789 Other chest pain: Secondary | ICD-10-CM | POA: Insufficient documentation

## 2018-04-14 DIAGNOSIS — Z6841 Body Mass Index (BMI) 40.0 and over, adult: Secondary | ICD-10-CM | POA: Diagnosis not present

## 2018-04-14 DIAGNOSIS — G43009 Migraine without aura, not intractable, without status migrainosus: Secondary | ICD-10-CM | POA: Insufficient documentation

## 2018-04-14 DIAGNOSIS — G43909 Migraine, unspecified, not intractable, without status migrainosus: Secondary | ICD-10-CM | POA: Insufficient documentation

## 2018-04-14 DIAGNOSIS — E559 Vitamin D deficiency, unspecified: Secondary | ICD-10-CM | POA: Diagnosis not present

## 2018-04-14 DIAGNOSIS — E781 Pure hyperglyceridemia: Secondary | ICD-10-CM | POA: Diagnosis not present

## 2018-04-14 HISTORY — PX: LEFT HEART CATH AND CORONARY ANGIOGRAPHY: CATH118249

## 2018-04-14 SURGERY — LEFT HEART CATH AND CORONARY ANGIOGRAPHY
Anesthesia: LOCAL

## 2018-04-14 MED ORDER — IOHEXOL 350 MG/ML SOLN
INTRAVENOUS | Status: DC | PRN
  Administered 2018-04-14: 70 mL via INTRA_ARTERIAL

## 2018-04-14 MED ORDER — HEPARIN SODIUM (PORCINE) 1000 UNIT/ML IJ SOLN
INTRAMUSCULAR | Status: AC
  Filled 2018-04-14: qty 1

## 2018-04-14 MED ORDER — MIDAZOLAM HCL 2 MG/2ML IJ SOLN
INTRAMUSCULAR | Status: AC
  Filled 2018-04-14: qty 2

## 2018-04-14 MED ORDER — SODIUM CHLORIDE 0.9 % WEIGHT BASED INFUSION
3.0000 mL/kg/h | INTRAVENOUS | Status: DC
  Administered 2018-04-14: 3 mL/kg/h via INTRAVENOUS

## 2018-04-14 MED ORDER — SODIUM CHLORIDE 0.9% FLUSH: 3.0000 mL | Freq: Two times a day (BID) | INTRAVENOUS | Status: DC

## 2018-04-14 MED ORDER — ASPIRIN 81 MG PO CHEW: 81.0000 mg | CHEWABLE_TABLET | ORAL | Status: DC

## 2018-04-14 MED ORDER — HEPARIN (PORCINE) IN NACL 1000-0.9 UT/500ML-% IV SOLN
INTRAVENOUS | Status: DC | PRN
  Administered 2018-04-14 (×2): 500 mL

## 2018-04-14 MED ORDER — LIDOCAINE HCL (PF) 1 % IJ SOLN
INTRAMUSCULAR | Status: AC
  Filled 2018-04-14: qty 30

## 2018-04-14 MED ORDER — FENTANYL CITRATE (PF) 100 MCG/2ML IJ SOLN
INTRAMUSCULAR | Status: DC | PRN
  Administered 2018-04-14 (×3): 25 ug via INTRAVENOUS

## 2018-04-14 MED ORDER — SODIUM CHLORIDE 0.9% FLUSH: 3.0000 mL | INTRAVENOUS | Status: DC | PRN

## 2018-04-14 MED ORDER — DEXAMETHASONE SODIUM PHOSPHATE 10 MG/ML IJ SOLN
10.0000 mg | Freq: Once | INTRAMUSCULAR | Status: AC
  Administered 2018-04-14: 10 mg via INTRAVENOUS
  Filled 2018-04-14: qty 1

## 2018-04-14 MED ORDER — ONDANSETRON HCL 4 MG/2ML IJ SOLN
4.0000 mg | Freq: Four times a day (QID) | INTRAMUSCULAR | Status: DC | PRN
  Administered 2018-04-14: 4 mg via INTRAVENOUS
  Filled 2018-04-14: qty 2

## 2018-04-14 MED ORDER — FENTANYL CITRATE (PF) 100 MCG/2ML IJ SOLN
INTRAMUSCULAR | Status: AC
  Filled 2018-04-14: qty 2

## 2018-04-14 MED ORDER — SODIUM CHLORIDE 0.9 % IV SOLN: 250.0000 mL | INTRAVENOUS | Status: DC | PRN

## 2018-04-14 MED ORDER — VERAPAMIL HCL 2.5 MG/ML IV SOLN
INTRAVENOUS | Status: AC
  Filled 2018-04-14: qty 2

## 2018-04-14 MED ORDER — DIPHENHYDRAMINE HCL 50 MG/ML IJ SOLN
25.0000 mg | Freq: Once | INTRAMUSCULAR | Status: AC
  Administered 2018-04-14: 25 mg via INTRAVENOUS
  Filled 2018-04-14: qty 1

## 2018-04-14 MED ORDER — HEPARIN (PORCINE) IN NACL 1000-0.9 UT/500ML-% IV SOLN
INTRAVENOUS | Status: AC
  Filled 2018-04-14: qty 1000

## 2018-04-14 MED ORDER — LIDOCAINE HCL (PF) 1 % IJ SOLN
INTRAMUSCULAR | Status: DC | PRN
  Administered 2018-04-14: 2 mL via INTRADERMAL

## 2018-04-14 MED ORDER — SODIUM CHLORIDE 0.9 % IV BOLUS
1000.0000 mL | Freq: Once | INTRAVENOUS | Status: AC
  Administered 2018-04-14: 1000 mL via INTRAVENOUS

## 2018-04-14 MED ORDER — ACETAMINOPHEN 325 MG PO TABS: 650.0000 mg | ORAL_TABLET | ORAL | Status: DC | PRN

## 2018-04-14 MED ORDER — HEPARIN SODIUM (PORCINE) 1000 UNIT/ML IJ SOLN
INTRAMUSCULAR | Status: DC | PRN
  Administered 2018-04-14: 5000 [IU] via INTRAVENOUS

## 2018-04-14 MED ORDER — SODIUM CHLORIDE 0.9 % WEIGHT BASED INFUSION
1.0000 mL/kg/h | INTRAVENOUS | Status: DC
  Administered 2018-04-14: 11:00:00 via INTRAVENOUS

## 2018-04-14 MED ORDER — SODIUM CHLORIDE 0.9 % IV SOLN: INTRAVENOUS | Status: DC

## 2018-04-14 MED ORDER — PROCHLORPERAZINE EDISYLATE 10 MG/2ML IJ SOLN
10.0000 mg | Freq: Once | INTRAMUSCULAR | Status: AC
  Administered 2018-04-14: 10 mg via INTRAVENOUS
  Filled 2018-04-14: qty 2

## 2018-04-14 MED ORDER — MIDAZOLAM HCL 2 MG/2ML IJ SOLN
INTRAMUSCULAR | Status: DC | PRN
  Administered 2018-04-14 (×2): 1 mg via INTRAVENOUS

## 2018-04-14 MED ORDER — VERAPAMIL HCL 2.5 MG/ML IV SOLN
INTRAVENOUS | Status: DC | PRN
  Administered 2018-04-14 (×2): 10 mL via INTRA_ARTERIAL

## 2018-04-14 SURGICAL SUPPLY — 12 items
CATH 5FR JL3.5 JR4 ANG PIG MP (CATHETERS) ×2 IMPLANT
CATH INFINITI 5 FR AR1 MOD (CATHETERS) ×2 IMPLANT
DEVICE RAD COMP TR BAND LRG (VASCULAR PRODUCTS) ×2 IMPLANT
GLIDESHEATH SLEND SS 6F .021 (SHEATH) ×2 IMPLANT
GUIDEWIRE INQWIRE 1.5J.035X260 (WIRE) ×2 IMPLANT
INQWIRE 1.5J .035X260CM (WIRE) ×4
KIT HEART LEFT (KITS) ×2 IMPLANT
PACK CARDIAC CATHETERIZATION (CUSTOM PROCEDURE TRAY) ×2 IMPLANT
SHEATH PROBE COVER 6X72 (BAG) ×2 IMPLANT
SYR MEDRAD MARK V 150ML (SYRINGE) ×2 IMPLANT
TRANSDUCER W/STOPCOCK (MISCELLANEOUS) ×2 IMPLANT
TUBING CIL FLEX 10 FLL-RA (TUBING) ×2 IMPLANT

## 2018-04-14 NOTE — ED Notes (Signed)
IV Team at bedside 

## 2018-04-14 NOTE — Discharge Instructions (Signed)
**Note Ethyle Tiedt-identified via Obfuscation** Radial Site Care °Refer to this sheet in the next few weeks. These instructions provide you with information about caring for yourself after your procedure. Your health care provider may also give you more specific instructions. Your treatment has been planned according to current medical practices, but problems sometimes occur. Call your health care provider if you have any problems or questions after your procedure. °What can I expect after the procedure? °After your procedure, it is typical to have the following: °· Bruising at the radial site that usually fades within 1-2 weeks. °· Blood collecting in the tissue (hematoma) that may be painful to the touch. It should usually decrease in size and tenderness within 1-2 weeks. ° °Follow these instructions at home: °· Take medicines only as directed by your health care provider. °· You may shower 24-48 hours after the procedure or as directed by your health care provider. Remove the bandage (dressing) and gently wash the site with plain soap and water. Pat the area dry with a clean towel. Do not rub the site, because this may cause bleeding. °· Do not take baths, swim, or use a hot tub until your health care provider approves. °· Check your insertion site every day for redness, swelling, or drainage. °· Do not apply powder or lotion to the site. °· Do not flex or bend the affected arm for 24 hours or as directed by your health care provider. °· Do not push or pull heavy objects with the affected arm for 24 hours or as directed by your health care provider. °· Do not lift over 10 lb (4.5 kg) for 5 days after your procedure or as directed by your health care provider. °· Ask your health care provider when it is okay to: °? Return to work or school. °? Resume usual physical activities or sports. °? Resume sexual activity. °· Do not drive home if you are discharged the same day as the procedure. Have someone else drive you. °· You may drive 24 hours after the procedure  unless otherwise instructed by your health care provider. °· Do not operate machinery or power tools for 24 hours after the procedure. °· If your procedure was done as an outpatient procedure, which means that you went home the same day as your procedure, a responsible adult should be with you for the first 24 hours after you arrive home. °· Keep all follow-up visits as directed by your health care provider. This is important. °Contact a health care provider if: °· You have a fever. °· You have chills. °· You have increased bleeding from the radial site. Hold pressure on the site. °Get help right away if: °· You have unusual pain at the radial site. °· You have redness, warmth, or swelling at the radial site. °· You have drainage (other than a small amount of blood on the dressing) from the radial site. °· The radial site is bleeding, and the bleeding does not stop after 30 minutes of holding steady pressure on the site. °· Your arm or hand becomes pale, cool, tingly, or numb. °This information is not intended to replace advice given to you by your health care provider. Make sure you discuss any questions you have with your health care provider. °Document Released: 09/25/2010 Document Revised: 01/29/2016 Document Reviewed: 03/11/2014 °Elsevier Interactive Patient Education © 2018 [employer] ° °

## 2018-04-14 NOTE — ED Notes (Signed)
EDP [place] at bedside 

## 2018-04-14 NOTE — Interval H&P Note (Signed)
History and Physical Interval Note:  04/14/2018 9:44 AM  Sheryl [name]  has presented today for cardiac catheterization, with the diagnosis of atypical chest pain and syncope. The various methods of treatment have been discussed with the patient and family. After consideration of risks, benefits and other options for treatment, the patient has consented to  Procedure(s): LEFT HEART CATH AND CORONARY ANGIOGRAPHY (N/A) as a surgical intervention .  The patient's history has been reviewed, patient examined, no change in status, stable for surgery.  I have reviewed the patient's chart and labs.  Questions were answered to the patient's satisfaction.    Cath Lab Visit (complete for each Cath Lab visit)  Clinical Evaluation Leading to the Procedure:   ACS: No.  Non-ACS:    Anginal Classification: CCS IV  Anti-ischemic medical therapy: No Therapy  Non-Invasive Test Results: No non-invasive testing performed  Prior CABG: No previous CABG  Deby Adger

## 2018-04-14 NOTE — ED Provider Notes (Signed)
[facility] EMERGENCY DEPARTMENT Provider Note   CSN: [id number] Arrival date & time: 04/14/18  1748     History   Chief Complaint Chief Complaint  Patient presents with  . Headache    HPI Sheryl [name] is a 47 y.o. female.  HPI  47 year old female with a history of chronic migraines presents with a recurrent migraine.  She was getting a heart catheterization today and when they gave her IV fentanyl she feels like this flared her migraines.  Since being in the recovery room she has had a progressively worsening headache.  Is mostly left-sided but is also global.  No neck stiffness or pain.  Has had some vomiting.  She is quite photophobic and phonophobic.  She states in the past Demerol has been very helpful.  Headache is severe.  Past Medical History:  Diagnosis Date  . Allergic rhinitis   . Anemia   . Atypical chest pain 01/07/2012   Ct chest 10/2011:  No acute process, no PE to midsized pulmonary vessels.    . Chronic migraine   . Migraines   . Miscarriage 08/1996, 12/1996   x2  . Normal labor 03/19/2014  . PONV (postoperative nausea and vomiting)   . Postoperative state 03/20/2014  . SVD (spontaneous vaginal delivery)    x 2  . Vitamin D deficiency     Patient Active Problem List   Diagnosis Date Noted  . Chronic migraine 06/15/2017  . Postoperative state 03/20/2014  . Normal labor 03/19/2014  . Atypical chest pain 01/07/2012    Past Surgical History:  Procedure Laterality Date  . ABDOMINAL HYSTERECTOMY    . BREAST SURGERY     cyst removed from left breast; benign  . CESAREAN SECTION N/A 03/20/2014   Procedure: CESAREAN SECTION;  Surgeon: Loney Laurence, MD;  Location: [facility];  Service: Obstetrics;  Laterality: N/A;  . cyst removed from L breast  10/2006   benign  . CYSTOSCOPY N/A 06/01/2017   Procedure: CYSTOSCOPY;  Surgeon: Carrington Clamp, MD;  Location: [facility];  Service: Gynecology;  Laterality: N/A;  . DILATION AND CURETTAGE OF UTERUS   08/1996, 12/1996   x 2 MAB  . DILATION AND CURETTAGE OF UTERUS    . KNEE ARTHROSCOPY Right 01/2016  . KNEE ARTHROSCOPY Right 08/2016  . live births  10/07/97, 02/08/2000   x2  . ROBOTIC ASSISTED TOTAL HYSTERECTOMY WITH SALPINGECTOMY Bilateral 06/01/2017   Procedure: ROBOTIC ASSISTED TOTAL HYSTERECTOMY WITH SALPINGECTOMY WITH PARTIAL REMOVAL OF HERNIA SAC;  Surgeon: Carrington Clamp, MD;  Location: [facility];  Service: Gynecology;  Laterality: Bilateral;  . TUBAL LIGATION Bilateral 03/20/2014   Procedure: BILATERAL TUBAL LIGATION;  Surgeon: Loney Laurence, MD;  Location: [facility];  Service: Obstetrics;  Laterality: Bilateral;  . UNILATERAL SALPINGECTOMY Right 03/20/2014   Procedure: UNILATERAL SALPINGECTOMY;  Surgeon: Loney Laurence, MD;  Location: [facility];  Service: Obstetrics;  Laterality: Right;     OB History    Gravida  5   Para  3   Term  1   Preterm  2   AB  2   Living  3     SAB  2   TAB  0   Ectopic  0   Multiple      Live Births  1            Home Medications    Prior to Admission medications   Medication Sig Start Date End Date Taking? Authorizing Provider  acetaminophen (  TYLENOL) 500 MG tablet Take 500-1,000 mg by mouth every 6 (six) hours as needed (for pain/headaches.).    [provider]  atenolol (TENORMIN) 25 MG tablet Take 1 tablet (25 mg total) by mouth daily. 11/01/17   Levert Feinstein, MD  cetirizine (ZYRTEC) 10 MG tablet Take 10 mg by mouth daily as needed for allergies ((hay fever/pollen)).     [provider]  eletriptan (RELPAX) 40 MG tablet Take 1 tablet (40 mg total) by mouth as needed for migraine or headache. May repeat in 2 hours if headache persists or recurs. 02/28/18   Nilda Riggs, NP  Multiple Vitamin (MULTIVITAMIN WITH MINERALS) TABS tablet Take 1 tablet by mouth daily.    [provider]  nitroGLYCERIN (NITROSTAT) 0.4 MG SL tablet Place 1 tablet (0.4 mg total) under the tongue every 5 (five) minutes as  needed for chest pain. 04/11/18 07/10/18  Wendall Stade, MD  ondansetron (ZOFRAN ODT) 4 MG disintegrating tablet Take 1 tablet (4 mg total) by mouth every 8 (eight) hours as needed. 06/15/17   Levert Feinstein, MD  tiZANidine (ZANAFLEX) 4 MG tablet Take 1 tablet (4 mg total) by mouth every 6 (six) hours as needed for muscle spasms. 10/27/17   Levert Feinstein, MD    Family History Family History  Problem Relation Age of Onset  . Heart attack Father   . Hypertension Father   . Hyperlipidemia Father   . Asthma Son   . Heart disease Maternal Grandfather   . Cancer Paternal Grandmother        unsure what kind    Social History Social History   Tobacco Use  . Smoking status: Never Smoker  . Smokeless tobacco: Never Used  Substance Use Topics  . Alcohol use: No  . Drug use: No     Allergies   Advil [ibuprofen] and Salicylates   Review of Systems Review of Systems  Constitutional: Negative for fever.  Eyes: Positive for photophobia. Negative for visual disturbance.  Gastrointestinal: Positive for nausea and vomiting. Negative for abdominal pain.  Musculoskeletal: Negative for neck pain.  Neurological: Positive for headaches. Negative for weakness and numbness.  All other systems reviewed and are negative.    Physical Exam Updated Vital Signs BP 117/74   Pulse 76   Temp (!) 96.3 F (35.7 C) (Axillary)   Resp 16   Ht 5\' 7"  (1.702 m)   Wt 127 kg   LMP 05/26/2017 (Exact Date)   SpO2 99%   BMI 43.85 kg/m   Physical Exam  Constitutional: She is oriented to person, place, and time. She appears well-developed and well-nourished.  Non-toxic appearance. She does not appear ill. She appears distressed.  Morbidly obese Wearing sunglasses Photophobic and phonophobic  HENT:  Head: Normocephalic and atraumatic.  Right Ear: External ear normal.  Left Ear: External ear normal.  Nose: Nose normal.  Eyes: Pupils are equal, round, and reactive to light. EOM are normal. Right eye exhibits  no discharge. Left eye exhibits no discharge.  Neck: Normal range of motion. Neck supple.  Cardiovascular: Normal rate, regular rhythm and normal heart sounds.  Pulmonary/Chest: Effort normal and breath sounds normal.  Abdominal: Soft. There is no tenderness.  Neurological: She is alert and oriented to person, place, and time.  CN 3-12 grossly intact. 5/5 strength in all 4 extremities. Grossly normal sensation. Normal finger to nose.   Skin: Skin is warm and dry.  Nursing note and vitals reviewed.    ED Treatments / Results  Labs (all labs ordered are listed, but only abnormal results are displayed) Labs Reviewed - No data to display  EKG None  Radiology No results found.  Procedures Procedures (including critical care time)  Medications Ordered in ED Medications  sodium chloride 0.9 % bolus 1,000 mL (0 mLs Intravenous Stopped 04/14/18 1958)  dexamethasone (DECADRON) injection 10 mg (10 mg Intravenous Given 04/14/18 1857)  prochlorperazine (COMPAZINE) injection 10 mg (10 mg Intravenous Given 04/14/18 1857)  diphenhydrAMINE (BENADRYL) injection 25 mg (25 mg Intravenous Given 04/14/18 1857)     Initial Impression / Assessment and Plan / ED Course  I have reviewed the triage vital signs and the nursing notes.  Pertinent labs & imaging results that were available during my care of the patient were reviewed by me and considered in my medical decision making (see chart for details).     Patient's headache resolved after treatment in the emergency department.  Vital signs are unremarkable.  Of note, her cardiac catheterization was unremarkable with no coronary disease.  As for her headache, I highly doubt an acute CNS emergency such as meningitis, subarachnoid hemorrhage, bleed, or stroke.  She is feeling significantly better and would like to go home.  Discussed return precautions.  Final Clinical Impressions(s) / ED Diagnoses   Final diagnoses:  Migraine without status migrainosus,  not intractable, unspecified migraine type    ED Discharge Orders    None       Pricilla LovelessGoldston, Maite Burlison, MD 04/14/18 2026

## 2018-04-14 NOTE — ED Triage Notes (Signed)
Pt c/o migraine that started after cardiac cath this am- husband states that she received fentanyl this am and that caused it - states normally needs demerol to treat.

## 2018-04-14 NOTE — Discharge Instructions (Addendum)
If your headache worsens or you develop fevers, vomiting, weakness or numbness in the extremities or any other new/concerning symptoms then return to the ER for evaluation.

## 2018-04-14 NOTE — ED Triage Notes (Signed)
Patient arrived via POV. Complaints of headache post cath placed.Fentanyl received after placement. Pain reported at forehead. Nausea and vomiting X5. Pain reported as stabbing.

## 2018-04-17 ENCOUNTER — Encounter (HOSPITAL_COMMUNITY): Payer: Self-pay | Admitting: Internal Medicine

## 2018-04-18 ENCOUNTER — Telehealth: Payer: Self-pay

## 2018-04-18 NOTE — Telephone Encounter (Signed)
Call received from [facility] requesting additional information to approve Patient's cardiac catheterization.  Will need EKG's, notes from ED, any lab work, any testing prior to cath.

## 2018-04-26 ENCOUNTER — Other Ambulatory Visit: Payer: 59

## 2018-04-26 ENCOUNTER — Ambulatory Visit: Payer: 59 | Admitting: Cardiology

## 2018-04-26 ENCOUNTER — Encounter: Payer: Self-pay | Admitting: Cardiology

## 2018-04-26 VITALS — BP 100/70 | HR 67 | Ht 67.0 in | Wt 274.8 lb

## 2018-04-26 DIAGNOSIS — D72829 Elevated white blood cell count, unspecified: Secondary | ICD-10-CM | POA: Diagnosis not present

## 2018-04-26 DIAGNOSIS — R079 Chest pain, unspecified: Secondary | ICD-10-CM | POA: Diagnosis not present

## 2018-04-26 LAB — LIPID PANEL
CHOL/HDL RATIO: 4.2 ratio (ref 0.0–4.4)
Cholesterol, Total: 168 mg/dL (ref 100–199)
HDL: 40 mg/dL (ref >39)
LDL Calculated: 87 mg/dL (ref 0–99)
Triglycerides: 205 mg/dL — ABNORMAL HIGH (ref 0–149)
VLDL Cholesterol Cal: 41 mg/dL — ABNORMAL HIGH (ref 5–40)

## 2018-04-26 LAB — BASIC METABOLIC PANEL
BUN/Creatinine Ratio: 14 (ref 9–23)
BUN: 10 mg/dL (ref 6–24)
CO2: 25 mmol/L (ref 20–29)
Calcium: 9.7 mg/dL (ref 8.7–10.2)
Chloride: 97 mmol/L (ref 96–106)
Creatinine, Ser: 0.72 mg/dL (ref 0.57–1.00)
GFR calc Af Amer: 116 mL/min/{1.73_m2} (ref >59)
GFR, EST NON AFRICAN AMERICAN: 101 mL/min/{1.73_m2} (ref >59)
Glucose: 91 mg/dL (ref 65–99)
POTASSIUM: 4.2 mmol/L (ref 3.5–5.2)
SODIUM: 138 mmol/L (ref 134–144)

## 2018-04-26 LAB — CBC
HEMATOCRIT: 41.2 % (ref 34.0–46.6)
Hemoglobin: 14.2 g/dL (ref 11.1–15.9)
MCH: 30.1 pg (ref 26.6–33.0)
MCHC: 34.5 g/dL (ref 31.5–35.7)
MCV: 88 fL (ref 79–97)
Platelets: 458 10*3/uL — ABNORMAL HIGH (ref 150–450)
RBC: 4.71 x10E6/uL (ref 3.77–5.28)
RDW: 13.3 % (ref 12.3–15.4)
WBC: 11.6 10*3/uL — ABNORMAL HIGH (ref 3.4–10.8)

## 2018-04-26 NOTE — Progress Notes (Signed)
Cardiology Office Note   Date:  04/26/2018   ID:  Sheryl [name], DOB 09/23/1970, MRN [id number]  PCP:  Tally JoeSwayne, David, MD  Cardiologist: Dr. Charlton HawsPeter Nishan, MD    Chief Complaint  Patient presents with  . Follow-up    post cath for Dr. Eden EmmsNishan    History of Present Illness: Sheryl [name] is a 47 y.o. female who presents for post-cath follow up, seen for Dr. Eden EmmsNishan.   Sheryl [name] is a 47yo F with a hx of migraines and obesity who was initally seen by Dr. Eden EmmsNishan 04/11/18 for consultation regarding exertional chest pain from ED MD on 04/08/18. Per chart review, she had left sided chest pain with raditaion to her left arm and jaw. Given her symptoms, she called EMS for transport to the ED for further evaluation. EMS gave ASA and SL NTG in which she had chest pain relief. She has no known coronary risk factors. In the ED, CXR was negative for cardiopulmonary abnormalitites, troponin negtative x2, negative D-dimer and unremarkable EKG. Plans were initally for coronary CT, however given her obesity and large breast attenuation, this was thought to give a false positive result. Therefore, she was scheduled for an elective diagnostic cardiac cath for definitive evalution. It was noted that she was already taking atenolol for her migraines.   On 04/14/18 the patient was taken to the cath lab in which there was no angiographically significant CAD found with normal LV function. Recommendations were made to focus on primary prevention of CAD. Per cath note, if chest pain recurs, consider empiric treatment for coronary spasm and/or evalution for noncardiac etiologies of chest pain.   Unfortunately, post-cath while in the recovery room, she expereinced a severe migraine secondary to fentanyl administration during the procedure. She was then sent to the ED and was treated and released from the ED in stable condition with folllow up scheduled for 04/26/18 in the office.   Today she presents for post cath follow  up and is doing well from a cardiac perspective. Her cath site is unremarkable and she has had no recurrent chest pain or associated symptoms. Given that her symptoms are clearly not from cardiac etiology, we discussed lifestyle modifications during her visit. She is down approximately 6 pounds. She states that her migraines are unfortunately triggered by heat and exercise, limiting her ability to perform most activities. She discusses her frustration with this. She takes atenolol daily and eletriptan PRN for her migraines. With her chest pain symptoms, she reports that she was in a migraine "cycle' and had taken several PRN doses of eletriptan. She is unable to associate if her chest pain symptoms were selectively associated with taking her eletriptan, however I have asked her to monitor this in the future, as this could be triggering mild coronary spasm. She had leukocytosis per lab draw prior to her cath, but denies recent illness or fevers. Otherwise, she is doing well and feels back to her usual self.   Past Medical History:  Diagnosis Date  . Allergic rhinitis   . Anemia   . Atypical chest pain 01/07/2012   Ct chest 10/2011:  No acute process, no PE to midsized pulmonary vessels.    . Chronic migraine   . Migraines   . Miscarriage 08/1996, 12/1996   x2  . Normal labor 03/19/2014  . PONV (postoperative nausea and vomiting)   . Postoperative state 03/20/2014  . SVD (spontaneous vaginal delivery)    x 2  . Vitamin  D deficiency     Past Surgical History:  Procedure Laterality Date  . ABDOMINAL HYSTERECTOMY    . BREAST SURGERY     cyst removed from left breast; benign  . CESAREAN SECTION N/A 03/20/2014   Procedure: CESAREAN SECTION;  Surgeon: Loney Laurence, MD;  Location: [facility];  Service: Obstetrics;  Laterality: N/A;  . cyst removed from L breast  10/2006   benign  . CYSTOSCOPY N/A 06/01/2017   Procedure: CYSTOSCOPY;  Surgeon: Carrington Clamp, MD;  Location: [facility];  Service:  Gynecology;  Laterality: N/A;  . DILATION AND CURETTAGE OF UTERUS  08/1996, 12/1996   x 2 MAB  . DILATION AND CURETTAGE OF UTERUS    . KNEE ARTHROSCOPY Right 01/2016  . KNEE ARTHROSCOPY Right 08/2016  . LEFT HEART CATH AND CORONARY ANGIOGRAPHY N/A 04/14/2018   Procedure: LEFT HEART CATH AND CORONARY ANGIOGRAPHY;  Surgeon: Yvonne Kendall, MD;  Location: [facility];  Service: Cardiovascular;  Laterality: N/A;  . live births  10/07/97, 02/08/2000   x2  . ROBOTIC ASSISTED TOTAL HYSTERECTOMY WITH SALPINGECTOMY Bilateral 06/01/2017   Procedure: ROBOTIC ASSISTED TOTAL HYSTERECTOMY WITH SALPINGECTOMY WITH PARTIAL REMOVAL OF HERNIA SAC;  Surgeon: Carrington Clamp, MD;  Location: [facility];  Service: Gynecology;  Laterality: Bilateral;  . TUBAL LIGATION Bilateral 03/20/2014   Procedure: BILATERAL TUBAL LIGATION;  Surgeon: Loney Laurence, MD;  Location: [facility];  Service: Obstetrics;  Laterality: Bilateral;  . UNILATERAL SALPINGECTOMY Right 03/20/2014   Procedure: UNILATERAL SALPINGECTOMY;  Surgeon: Loney Laurence, MD;  Location: [facility];  Service: Obstetrics;  Laterality: Right;    Current Outpatient Medications  Medication Sig Dispense Refill  . acetaminophen (TYLENOL) 500 MG tablet Take 500-1,000 mg by mouth every 6 (six) hours as needed (for pain/headaches.).    Marland Kitchen atenolol (TENORMIN) 25 MG tablet Take 1 tablet (25 mg total) by mouth daily. 30 tablet 11  . cetirizine (ZYRTEC) 10 MG tablet Take 10 mg by mouth daily as needed for allergies ((hay fever/pollen)).     Marland Kitchen eletriptan (RELPAX) 40 MG tablet Take 1 tablet (40 mg total) by mouth as needed for migraine or headache. May repeat in 2 hours if headache persists or recurs. 4 tablet 6  . Multiple Vitamin (MULTIVITAMIN WITH MINERALS) TABS tablet Take 1 tablet by mouth daily.    . nitroGLYCERIN (NITROSTAT) 0.4 MG SL tablet Place 1 tablet (0.4 mg total) under the tongue every 5 (five) minutes as needed for chest pain. 25 tablet 3  . ondansetron  (ZOFRAN ODT) 4 MG disintegrating tablet Take 1 tablet (4 mg total) by mouth every 8 (eight) hours as needed. 20 tablet 11  . tiZANidine (ZANAFLEX) 4 MG tablet Take 1 tablet (4 mg total) by mouth every 6 (six) hours as needed for muscle spasms. 30 tablet 6   No current facility-administered medications for this visit.     Allergies:   Advil [ibuprofen]; Salicylates; and Fentanyl   Social History:  The patient  reports that she has never smoked. She has never used smokeless tobacco. She reports that she does not drink alcohol or use drugs.   Family History:  The patient's family history includes Asthma in her son; Cancer in her paternal grandmother; Heart attack in her father; Heart disease in her maternal grandfather; Hyperlipidemia in her father; Hypertension in her father.    ROS:  Please see the history of present illness.  Otherwise, review of systems are positive for none.   All other systems are reviewed  and negative.   PHYSICAL EXAM: VS:  BP 100/70 (BP Location: Left Arm, Patient Position: Sitting, Cuff Size: Large)   Pulse 67   Ht 5\' 7"  (1.702 m)   Wt 274 lb 12.8 oz (124.6 kg)   LMP 05/26/2017 (Exact Date)   SpO2 97% Comment: At rest  BMI 43.04 kg/m  , BMI Body mass index is 43.04 kg/m.  General: Obese, well developed, well nourished, NAD Skin: Warm, dry, intact  Head: Normocephalic, atraumatic, clear, moist mucus membranes. Lungs:Clear to ausculation bilaterally. No wheezes, rales, or rhonchi. Breathing is unlabored. Cardiovascular: RRR with S1 S2. No murmurs, rubs, gallops, or LV heave appreciated. MSK: Strength and tone appear normal for age. 5/5 in all extremities Extremities: No edema. No clubbing or cyanosis. DP/PT pulses 2+ bilaterally Neuro: Alert and oriented. No focal deficits. No facial asymmetry. MAE spontaneously. Psych: Responds to questions appropriately with normal affect.    EKG:  EKG is not ordered today.  Recent Labs: 11/21/2017: ALT 17 04/11/2018: BUN  9; Creatinine, Ser 0.61; Hemoglobin 13.9; Platelets 426; Potassium 4.6; Sodium 144   Lipid Panel    Component Value Date/Time   CHOL  06/03/2010 0453    135        ATP III CLASSIFICATION:  <200     mg/dL   Desirable  [id number]  mg/dL   Borderline High  >=045>=240    mg/dL   High          TRIG 409194 (H) 06/03/2010 0453   HDL 47 06/03/2010 0453   CHOLHDL 2.9 06/03/2010 0453   VLDL 39 06/03/2010 0453   LDLCALC  06/03/2010 0453    49        Total Cholesterol/HDL:CHD Risk Coronary Heart Disease Risk Table                     Men   Women  1/2 Average Risk   3.4   3.3  Average Risk       5.0   4.4  2 X Average Risk   9.6   7.1  3 X Average Risk  23.4   11.0        Use the calculated Patient Ratio above and the CHD Risk Table to determine the patient's CHD Risk.        ATP III CLASSIFICATION (LDL):  <100     mg/dL   Optimal  [id number]  mg/dL   Near or Above                    Optimal  130-159  mg/dL   Borderline  [id number]  mg/dL   High  >213>190     mg/dL   Very High   Wt Readings from Last 3 Encounters:  04/26/18 274 lb 12.8 oz (124.6 kg)  04/14/18 280 lb (127 kg)  04/14/18 280 lb (127 kg)     Other studies Reviewed: Additional studies/ records that were reviewed today include:  Cardiac catheterization 04/14/18: Conclusions: 1. No angiographically significant coronary artery disease. 2. Normal left ventricular systolic function. 3. Upper normal LVEDP.  Recommendations: 1. Primary prevention of coronary artery disease. 2. If chest pain recurs, consider empiric treatment for coronary vasospasm and/or evaluation for noncardiac etiologies of chest pain.  No indication for antiplatelet therapy at this time.    ASSESSMENT AND PLAN:  1. Atypical chest pain: -As stated above, cardiac cath was negative for CAD with recommendations to focus on primary prevention of CAD including lifestyle modifications  -  Recommendations today are to focus on weight loss strategies including diet  interventions (low salt, low fat, high in complex carbohydrates) and increasing excersise including at least 3-4 days of at least 20 minutes of continual aerobic exercise per day. Pt reports that any more than 30 minutes of activity will trigger a severe migraine. I have suggested that she start with 20 minutes of light walking and increase as tolerated in hopes that she will withstand greater than 30 minutes at a time. I have encouraged increasing her water intake and limiting fried or processed foods from her diet.  -Will check lipid panel today and consider adding statin if elevated>>last LDL on 05/20/2016 noted to be 114 -Encouraged seeking greater migraine control from neuro and PCP teams -If recurrent chest pain, could consider adding Imdur or Renexa  -Will have her follow cardiology on PRN basis   2. Obesity: -BMI, 43.84 -See plan above   3. Hx of migraines: -Stable, no recurrence since post-cath episode -Continue atenolol per primary    Current medicines are reviewed at length with the patient today.  The patient does not have concerns regarding medicines.  The following changes have been made:  no change  Labs/ tests ordered today include:   Orders Placed This Encounter  Procedures  . CBC  . Basic Metabolic Panel (BMET)  . Lipid Profile  . Urinalysis    Disposition:   FU PRN for recurrent symptoms   Signed, Georgie Chard, NP  04/26/2018 11:52 AM    [facility] [address] [place], [place], [place]  [address] Phone: [phone]; Fax: [phone]

## 2018-04-26 NOTE — Patient Instructions (Signed)
We will be checking the following labs today - CBC, BMET, UA and Lipid panel today    Medication Instructions:    Continue with your current medicines.     Testing/Procedures To Be Arranged:  N/A  Follow-Up:   See us back on an as needed basis if recurrent pain     Other Special Instructions:   N/A    If you need a refill on your cardiac medications before your next appointment, please call your pharmacy.   Call the [facility] office at [phone] if you have any questions, problems or concerns.

## 2018-07-20 ENCOUNTER — Other Ambulatory Visit: Payer: Self-pay | Admitting: Neurology

## 2018-10-11 ENCOUNTER — Other Ambulatory Visit: Payer: Self-pay | Admitting: Obstetrics and Gynecology

## 2018-10-11 DIAGNOSIS — N6310 Unspecified lump in the right breast, unspecified quadrant: Secondary | ICD-10-CM

## 2018-10-31 ENCOUNTER — Ambulatory Visit: Payer: 59 | Admitting: Nurse Practitioner

## 2018-10-31 ENCOUNTER — Ambulatory Visit (INDEPENDENT_AMBULATORY_CARE_PROVIDER_SITE_OTHER): Payer: 59 | Admitting: Neurology

## 2018-10-31 ENCOUNTER — Encounter: Payer: Self-pay | Admitting: Neurology

## 2018-10-31 VITALS — BP 105/74 | HR 65 | Ht 67.0 in | Wt 283.5 lb

## 2018-10-31 DIAGNOSIS — G43709 Chronic migraine without aura, not intractable, without status migrainosus: Secondary | ICD-10-CM

## 2018-10-31 DIAGNOSIS — IMO0002 Reserved for concepts with insufficient information to code with codable children: Secondary | ICD-10-CM

## 2018-10-31 MED ORDER — ATENOLOL 25 MG PO TABS: 25.0000 mg | ORAL_TABLET | Freq: Every day | ORAL | 4 refills | Status: DC

## 2018-10-31 MED ORDER — ELETRIPTAN HYDROBROMIDE 40 MG PO TABS: 40.0000 mg | ORAL_TABLET | ORAL | 11 refills | Status: DC | PRN

## 2018-10-31 NOTE — Progress Notes (Signed)
[facility]  PATIENT: Sheryl [name] DOB: [birth year]    HISTORY OF PRESENT ILLNESS: Sheryl [name] is a 48 years old female, seen in refer by  her primary care doctor [name], for evaluation of frequent migraine headache initial evaluation was June 15 2017.  I have reviewed the record, she has a long history of migraine headaches since teenager, gradually getting worse, began to see [name] in 2008, at that time she reported almost daily headaches,  She was treated with preventive medication atenolol 50 mg daily, which is helpful, previously also tried amitriptyline, nortriptyline, verapamil, which all helped some, but gradually loses benefit,  She was able to identify triggers such as cheese, onion, stress, caffeine, chocolate, heat,  Her headache overall has much improved, she is now taking Relpax as needed, that is the only triptan she has tried, at its best, it would take away her headache within one hour, but sometimes her headache would last for half days, occasionally she received Dilaudid, Phenergan and Demerol as abortive treatment  Her typical migraine are right lateralized severe pounding headache with associated light noise sensitivity, nauseous, vomiting, sometimes involving left side,  She was admitted to the hospital in September 2011 for 1 prolonged episode of headache with associated right side weakness and blurry vision, she had extensive evaluations, MRI of the brain showed no acute abnormality. Echocardiogram showed ejection fraction 65%, transcranial Doppler study was normal,  UPDATE 11-05-17:YY Her father passed away on 2017/07/15, she complains of increased headaches, 8-10 migraines each month,Maxalt does help her migraine, she felt wiped out afterwards     UPDATE Oct 31 2018: She is doing very well with atenolol 25 mg every day as headache prevention, on he has 1-2 headaches each month, responding well to Relpax,  REVIEW  OF SYSTEMS: Full 14 system review of systems performed and notable only for those listed, all others are neg:     ALLERGIES: Allergies  Allergen Reactions  . Advil [Ibuprofen] Anaphylaxis and Hives  . Salicylates Anaphylaxis and Hives    Unknown reaction Sulfur meds  . Fentanyl Nausea And Vomiting    Migraine, vomiting    HOME MEDICATIONS: Outpatient Medications Prior to Visit  Medication Sig Dispense Refill  . acetaminophen (TYLENOL) 500 MG tablet Take 500-1,000 mg by mouth every 6 (six) hours as needed (for pain/headaches.).    Marland Kitchen atenolol (TENORMIN) 25 MG tablet Take 1 tablet (25 mg total) by mouth daily. 30 tablet 11  . cetirizine (ZYRTEC) 10 MG tablet Take 10 mg by mouth daily as needed for allergies ((hay fever/pollen)).     Marland Kitchen eletriptan (RELPAX) 40 MG tablet Take 1 tablet (40 mg total) by mouth as needed for migraine or headache. May repeat in 2 hours if headache persists or recurs. 4 tablet 6  . Multiple Vitamin (MULTIVITAMIN WITH MINERALS) TABS tablet Take 1 tablet by mouth daily.    . ondansetron (ZOFRAN ODT) 4 MG disintegrating tablet Take 1 tablet (4 mg total) by mouth every 8 (eight) hours as needed. 20 tablet 11  . tiZANidine (ZANAFLEX) 4 MG tablet Take 1 tablet (4 mg total) by mouth every 6 (six) hours as needed for muscle spasms. 30 tablet 6  . nitroGLYCERIN (NITROSTAT) 0.4 MG SL tablet Place 1 tablet (0.4 mg total) under the tongue every 5 (five) minutes as needed for chest pain. 25 tablet 3   No facility-administered medications prior to visit.     PAST MEDICAL HISTORY: Past  Medical History:  Diagnosis Date  . Allergic rhinitis   . Anemia   . Atypical chest pain 01/07/2012   Ct chest 10/2011:  No acute process, no PE to midsized pulmonary vessels.    . Chronic migraine   . Migraines   . Miscarriage 08/1996, 12/1996   x2  . Normal labor 03/19/2014  . PONV (postoperative nausea and vomiting)   . Postoperative state 03/20/2014  . SVD (spontaneous vaginal delivery)      x 2  . Vitamin D deficiency     PAST SURGICAL HISTORY: Past Surgical History:  Procedure Laterality Date  . ABDOMINAL HYSTERECTOMY    . BREAST SURGERY     cyst removed from left breast; benign  . CESAREAN SECTION N/A 03/20/2014   Procedure: CESAREAN SECTION;  Surgeon: Loney Laurence, MD;  Location: [facility];  Service: Obstetrics;  Laterality: N/A;  . cyst removed from L breast  10/2006   benign  . CYSTOSCOPY N/A 06/01/2017   Procedure: CYSTOSCOPY;  Surgeon: Carrington Clamp, MD;  Location: [facility];  Service: Gynecology;  Laterality: N/A;  . DILATION AND CURETTAGE OF UTERUS  08/1996, 12/1996   x 2 MAB  . DILATION AND CURETTAGE OF UTERUS    . KNEE ARTHROSCOPY Right 01/2016  . KNEE ARTHROSCOPY Right 08/2016  . LEFT HEART CATH AND CORONARY ANGIOGRAPHY N/A 04/14/2018   Procedure: LEFT HEART CATH AND CORONARY ANGIOGRAPHY;  Surgeon: Yvonne Kendall, MD;  Location: [facility];  Service: Cardiovascular;  Laterality: N/A;  . live births  10/07/97, 02/08/2000   x2  . ROBOTIC ASSISTED TOTAL HYSTERECTOMY WITH SALPINGECTOMY Bilateral 06/01/2017   Procedure: ROBOTIC ASSISTED TOTAL HYSTERECTOMY WITH SALPINGECTOMY WITH PARTIAL REMOVAL OF HERNIA SAC;  Surgeon: Carrington Clamp, MD;  Location: [facility];  Service: Gynecology;  Laterality: Bilateral;  . TUBAL LIGATION Bilateral 03/20/2014   Procedure: BILATERAL TUBAL LIGATION;  Surgeon: Loney Laurence, MD;  Location: [facility];  Service: Obstetrics;  Laterality: Bilateral;  . UNILATERAL SALPINGECTOMY Right 03/20/2014   Procedure: UNILATERAL SALPINGECTOMY;  Surgeon: Loney Laurence, MD;  Location: [facility];  Service: Obstetrics;  Laterality: Right;    FAMILY HISTORY: Family History  Problem Relation Age of Onset  . Heart attack Father   . Hypertension Father   . Hyperlipidemia Father   . Asthma Son   . Heart disease Maternal Grandfather   . Cancer Paternal Grandmother        unsure what kind    SOCIAL HISTORY: Social History    Socioeconomic History  . Marital status: Married    Spouse name: Not on file  . Number of children: 3  . Years of education: 2.5 yrs college  . Highest education level: Not on file  Occupational History  . Occupation: Production designer, theatre/television/film. Rep    Employer: [employer]  Social Needs  . Financial resource strain: Not on file  . Food insecurity:    Worry: Not on file    Inability: Not on file  . Transportation needs:    Medical: Not on file    Non-medical: Not on file  Tobacco Use  . Smoking status: Never Smoker  . Smokeless tobacco: Never Used  Substance and Sexual Activity  . Alcohol use: No  . Drug use: No  . Sexual activity: Yes    Birth control/protection: Surgical  Lifestyle  . Physical activity:    Days per week: Not on file    Minutes per session: Not on file  . Stress: Not on  file  Relationships  . Social connections:    Talks on phone: Not on file    Gets together: Not on file    Attends religious service: Not on file    Active member of club or organization: Not on file    Attends meetings of clubs or organizations: Not on file    Relationship status: Not on file  . Intimate partner violence:    Fear of current or ex partner: Not on file    Emotionally abused: Not on file    Physically abused: Not on file    Forced sexual activity: Not on file  Other Topics Concern  . Not on file  Social History Narrative   Lives at home with husband and three children.   Left-handed.   No caffeine use.         PHYSICAL EXAM  Vitals:   10/31/18 0934  BP: 105/74  Pulse: 65  Weight: 283 lb 8 oz (128.6 kg)  Height:  (1.702 m)   Body mass index is 44.4 kg/m.  Generalized: Well developed, obese female in no acute distress  Head: normocephalic and atraumatic,. Oropharynx benign  Neck: Supple,   Musculoskeletal: No deformity   Neurological examination   Mentation: Alert oriented to time, place, history taking. Attention span and  concentration appropriate. Recent and remote memory intact.  Follows all commands speech and language fluent.   Cranial nerve II-XII: Pupils were equal round reactive to light extraocular movements were full, funduscopy examinations were normal, visual field were full on confrontational test. Facial sensation and strength were normal. hearing was intact to finger rubbing bilaterally. Uvula tongue midline. head turning and shoulder shrug were normal and symmetric.Tongue protrusion into cheek strength was normal. Motor: normal bulk and tone, full strength in the BUE, BLE, Sensory: normal and symmetric to light touch,  Coordination: finger-nose-finger, symmetric upper and lower  no dysmetria Reflexes: Symmetric upper and lower plantar responses were flexor bilaterally. Gait and Station: Rising up from seated position without assistance, normal stance, DIAGNOSTIC DATA (LABS, IMAGING, TESTING) - I reviewed patient records, labs, notes, testing and imaging myself where available.  Lab Results  Component Value Date   WBC 11.6 (H) 04/26/2018   HGB 14.2 04/26/2018   HCT 41.2 04/26/2018   MCV 88 04/26/2018   PLT 458 (H) 04/26/2018      Component Value Date/Time   NA 138 04/26/2018 1204   K 4.2 04/26/2018 1204   CL 97 04/26/2018 1204   CO2 25 04/26/2018 1204   GLUCOSE 91 04/26/2018 1204   GLUCOSE 100 (H) 04/08/2018 1723   BUN 10 04/26/2018 1204   CREATININE 0.72 04/26/2018 1204   CALCIUM 9.7 04/26/2018 1204   PROT 6.1 (L) 11/21/2017 0259   ALBUMIN 3.3 (L) 11/21/2017 0259   AST 23 11/21/2017 0259   ALT 17 11/21/2017 0259   ALKPHOS 60 11/21/2017 0259   BILITOT 0.4 11/21/2017 0259   GFRNONAA 101 04/26/2018 1204   GFRAA 116 04/26/2018 1204   Lab Results  Component Value Date   CHOL 168 04/26/2018   HDL 40 04/26/2018   LDLCALC 87 04/26/2018   TRIG 205 (H) 04/26/2018   CHOLHDL 4.2 04/26/2018   Lab Results  Component Value Date   HGBA1C  06/03/2010    5.5 (NOTE)  According to the ADA Clinical Practice Recommendations for 2011, when HbA1c is used as a screening test:   >=6.5%   Diagnostic of Diabetes Mellitus           (if abnormal result  is confirmed)  5.7-6.4%   Increased risk of developing Diabetes Mellitus  References:Diagnosis and Classification of Diabetes Mellitus,Diabetes Care,2011,34(Suppl 1):S62-S69 and Standards of Medical Care in         Diabetes - 2011,Diabetes Care,2011,34  (Suppl 1):S11-S61.    ASSESSMENT AND PLAN  Chronic migraine headaches  Previously Topamax, magnesium oxide, and riboflavin did not help her headache  Atenolol 25 mg for migraine prevention  Relpax 40 mg as needed was helpful  Levert Feinstein, M.D. Ph.D.  [facility] [address] [place], [place] [address] Phone: [phone] Fax:      [phone]

## 2018-12-14 ENCOUNTER — Other Ambulatory Visit: Payer: Self-pay | Admitting: Neurology

## 2019-10-02 ENCOUNTER — Telehealth: Payer: Self-pay | Admitting: *Deleted

## 2019-10-02 NOTE — Telephone Encounter (Signed)
PA for Relpax started on covermymeds (key: [id number]). Pt has pharmacy coverage through [employer] [phone]). Pt RC#[id number]. Decision pending.

## 2019-10-03 ENCOUNTER — Telehealth: Payer: Self-pay | Admitting: *Deleted

## 2019-10-03 MED ORDER — ZOLMITRIPTAN 5 MG PO TABS: ORAL_TABLET | ORAL | 1 refills | Status: DC

## 2019-10-03 NOTE — Telephone Encounter (Signed)
The patient has only taken eletriptan 40mg  in the past. She has never tried any other triptan.  She has pharmacy coverage through [employer] [phone]). Pt ([phone].  Her plan for 2021 will no longer cover eletriptan without her trying ALL the formulary options which are: 1) naratriptan 2) sumatriptan 3) zolmitriptan  I have spoken with the patient and she is agreeable to whichever medication Dr. 2022 feels is best.  Per vo by Dr. Terrace Arabia, provide rx for zolmitriptan 5mg . The rx will be sent in for #12 but the patient's plan has previously only allowed #4 per month in the past.  The patient is also aware of the quantity limitations.

## 2019-11-05 ENCOUNTER — Other Ambulatory Visit: Payer: Self-pay | Admitting: Neurology

## 2019-11-12 ENCOUNTER — Encounter: Payer: Self-pay | Admitting: Neurology

## 2019-11-12 ENCOUNTER — Telehealth (INDEPENDENT_AMBULATORY_CARE_PROVIDER_SITE_OTHER): Payer: 59 | Admitting: Neurology

## 2019-11-12 DIAGNOSIS — IMO0002 Reserved for concepts with insufficient information to code with codable children: Secondary | ICD-10-CM

## 2019-11-12 DIAGNOSIS — G43709 Chronic migraine without aura, not intractable, without status migrainosus: Secondary | ICD-10-CM | POA: Diagnosis not present

## 2019-11-12 MED ORDER — ATENOLOL 25 MG PO TABS: 50.0000 mg | ORAL_TABLET | Freq: Every day | ORAL | 4 refills | Status: DC

## 2019-11-12 MED ORDER — ZOLMITRIPTAN 5 MG PO TABS: ORAL_TABLET | ORAL | 11 refills | Status: DC

## 2019-11-12 MED ORDER — ONDANSETRON 4 MG PO TBDP: 4.0000 mg | ORAL_TABLET | Freq: Three times a day (TID) | ORAL | 11 refills | Status: DC | PRN

## 2019-11-12 MED ORDER — TIZANIDINE HCL 4 MG PO TABS: ORAL_TABLET | ORAL | 6 refills | Status: DC

## 2019-11-12 NOTE — Progress Notes (Signed)
Virtual Visit via Video Note  I connected with Sheryl [name] on 11/12/19 at  3:45 PM EST by a video enabled telemedicine application and verified that I am speaking with the correct person using two identifiers.  Location: Patient: at her home Provider: in the office    I discussed the limitations of evaluation and management by telemedicine and the availability of in person appointments. The patient expressed understanding and agreed to proceed.  History of Present Illness: Sheryl Edds Bowdenis a 49 years old female, seen in refer by her primary care doctor [name], for evaluation of frequent migraine headache initial evaluation was June 15 2017.  I have reviewed the record, she has a long history of migraine headaches since teenager, gradually getting worse, began to see [name] in 2008, at that time she reported almost daily headaches,  She was treated with preventive medication atenolol 50 mg daily, which is helpful, previously also tried amitriptyline, nortriptyline, verapamil, which all helped some, but gradually loses benefit,  She was able to identify triggers such as cheese, onion, stress, caffeine, chocolate, heat,  Her headache overall has much improved, she is now taking Relpax as needed, that is the only triptan she has tried, at its best, it would take away her headache within one hour, but sometimes her headache would last for half days, occasionally she received Dilaudid, Phenergan and Demerol as abortive treatment  Her typical migraine are right lateralized severe pounding headache with associated light noise sensitivity, nauseous, vomiting, sometimes involving left side,  She was admitted to the hospital in September 2011 for 1 prolonged episode of headache with associated right side weakness and blurry vision, she had extensive evaluations, MRI of the brain showed no acute abnormality. Echocardiogram showed ejection fraction 65%, transcranial Doppler  study was normal,  UPDATE 10-Nov-2017:YY Her father passed away on 07/20/17,she complains of increased headaches, 8-10 migraines each month,Maxalt does help her migraine,she felt wiped out afterwards    UPDATE Oct 31 2018: She is doing very well with atenolol 25 mg every day as headache prevention, on he has 1-2 headaches each month, responding well to Relpax,  Update November 12, 2019 SS: Via virtual visit, reports headaches had been doing really well, for the last 30 days, has had 6 migraines.  Usually, she only has 2 a month.  Attributed to increased stress, her son is having a baby unexpectedly.  Currently, Zomig works well for her, but she only gets 3 tablets a month per insurance.  She remains on atenolol.   Observations/Objective: Via virtual visit, is alert and oriented, speech is clear and concise, facial symmetry noted, no arm drift, gait is intact, excellent historian, says heart rate usually runs in the 70s  Assessment and Plan: 1.  Chronic migraine headache -Headaches have been under excellent control, last month or so, increased stress, increased headaches 6 for the last 30 days  -Increase atenolol 50 mg daily, monitor heart rate and blood pressure -Continue Zomig 5 mg as needed, may combine with tizanidine, Zofran as needed -She has anaphylaxis to Advil, deferred giving diclofenac potassium -Follow-up in 3 months or sooner if needed  Follow Up Instructions: 3 months 02/14/2020 3:15   I discussed the assessment and treatment plan with the patient. The patient was provided an opportunity to ask questions and all were answered. The patient agreed with the plan and demonstrated an understanding of the instructions.   The patient was advised to call back or  seek an in-person evaluation if the symptoms worsen or if the condition fails to improve as anticipated.  I provided 15 minutes of non-face-to-face time during this encounter.  Evangeline Dakin, DNP  [facility] [address], [place], [place] [address] [phone]

## 2019-11-12 NOTE — Progress Notes (Signed)
I have reviewed and agreed above plan. 

## 2019-12-05 ENCOUNTER — Other Ambulatory Visit: Payer: Self-pay | Admitting: Obstetrics and Gynecology

## 2019-12-05 DIAGNOSIS — N6310 Unspecified lump in the right breast, unspecified quadrant: Secondary | ICD-10-CM

## 2020-02-14 ENCOUNTER — Ambulatory Visit (INDEPENDENT_AMBULATORY_CARE_PROVIDER_SITE_OTHER): Payer: 59 | Admitting: Neurology

## 2020-02-14 ENCOUNTER — Other Ambulatory Visit: Payer: Self-pay

## 2020-02-14 ENCOUNTER — Encounter: Payer: Self-pay | Admitting: Neurology

## 2020-02-14 VITALS — BP 120/82 | HR 60 | Ht 67.5 in | Wt 286.0 lb

## 2020-02-14 DIAGNOSIS — IMO0002 Reserved for concepts with insufficient information to code with codable children: Secondary | ICD-10-CM

## 2020-02-14 DIAGNOSIS — G43709 Chronic migraine without aura, not intractable, without status migrainosus: Secondary | ICD-10-CM | POA: Diagnosis not present

## 2020-02-14 MED ORDER — ATENOLOL 25 MG PO TABS: 25.0000 mg | ORAL_TABLET | Freq: Every day | ORAL | 4 refills | Status: DC

## 2020-02-14 NOTE — Patient Instructions (Signed)
Continue current medications  See you back in 1 year or sooner if needed 

## 2020-02-14 NOTE — Progress Notes (Signed)
PATIENT: Sheryl [name] DOB: 1970-12-09  REASON FOR VISIT: follow up HISTORY FROM: patient  HISTORY OF PRESENT ILLNESS: Today 02/14/20  HISTORY [name] a 49 years old female, seen in refer by her primary care doctor [name], for evaluation of frequent migraine headache initial evaluation was June 15 2017.  I have reviewed the record, she has a long history of migraine headaches since teenager, gradually getting worse, began to see [name] in 2008, at that time she reported almost daily headaches,  She was treated with preventive medication atenolol 50 mg daily, which is helpful, previously also tried amitriptyline, nortriptyline, verapamil, which all helped some, but gradually loses benefit,  She was able to identify triggers such as cheese, onion, stress, caffeine, chocolate, heat,  Her headache overall has much improved, she is now taking Relpax as needed, that is the only triptan she has tried, at its best, it would take away her headache within one hour, but sometimes her headache would last for half days, occasionally she received Dilaudid, Phenergan and Demerol as abortive treatment  Her typical migraine are right lateralized severe pounding headache with associated light noise sensitivity, nauseous, vomiting, sometimes involving left side,  She was admitted to the hospital in September 2011 for 1 prolonged episode of headache with associated right side weakness and blurry vision, she had extensive evaluations, MRI of the brain showed no acute abnormality. Echocardiogram showed ejection fraction 65%, transcranial Doppler study was normal,  UPDATE 11-02-2017:YY Her father passed away on July 12, 2017,she complains of increased headaches, 8-10 migraines each month,Maxalt does help her migraine,she felt wiped out afterwards  UPDATE Oct 31 2018: She is doing very well with atenolol 25 mg every day as headache prevention, on he has 1-2 headaches each  month, responding well to Relpax,  Update November 12, 2019 SS: Via virtual visit, reports headaches had been doing really well, for the last 30 days, has had 6 migraines.  Usually, she only has 2 a month.  Attributed to increased stress, her son is having a baby unexpectedly.  Currently, Zomig works well for her, but she only gets 3 tablets a month per insurance.  She remains on atenolol.   Update February 14, 2020 SS: After last visit, atenolol was increased to 50 mg for migraine prevention due to worsening headaches associated with increased stress.  She only took the higher dose for 1 month, went back down to 25 mg daily.  She is doing well.  On average 1-2 headaches a month. She has Zomig, Zanaflex, Zofran for acute headache-these work well for her. She only gets 3 Zomig tablets a month.  She is now exercising, trying to stay out of the heat which can be a trigger for migraines.  Presents today for evaluation unaccompanied. She is expecting her first grandchild.  REVIEW OF SYSTEMS: Out of a complete 14 system review of symptoms, the patient complains only of the following symptoms, and all other reviewed systems are negative.  Headache  ALLERGIES: Allergies  Allergen Reactions  . Advil [Ibuprofen] Anaphylaxis and Hives  . Salicylates Anaphylaxis and Hives    Unknown reaction Sulfur meds  . Fentanyl Nausea And Vomiting    Migraine, vomiting    HOME MEDICATIONS: Outpatient Medications Prior to Visit  Medication Sig Dispense Refill  . acetaminophen (TYLENOL) 500 MG tablet Take 500-1,000 mg by mouth every 6 (six) hours as needed (for pain/headaches.).    Marland Kitchen cetirizine (ZYRTEC) 10 MG tablet Take 10  mg by mouth daily as needed for allergies ((hay fever/pollen)).     . Cyanocobalamin 1000 MCG CAPS 1,000 mcg.    . Multiple Vitamin (MULTIVITAMIN WITH MINERALS) TABS tablet Take 1 tablet by mouth daily.    . ondansetron (ZOFRAN ODT) 4 MG disintegrating tablet Take 1 tablet (4 mg total) by mouth every 8  (eight) hours as needed. 20 tablet 11  . tiZANidine (ZANAFLEX) 4 MG tablet TAKE 1 TABLET BY MOUTH EVERY 6 HOURS AS NEEDED FOR MUSCLE SPASMS 30 tablet 6  . zolmitriptan (ZOMIG) 5 MG tablet Take 1 tab at onset of migraine.  May repeat in 2 hrs, if needed.  Max dose: 2 tabs/day. This is a 30 day prescription. 12 tablet 11  . atenolol (TENORMIN) 25 MG tablet Take 2 tablets (50 mg total) by mouth daily. (Patient taking differently: Take 25 mg by mouth daily. 1 tab daily) 180 tablet 4  . nitroGLYCERIN (NITROSTAT) 0.4 MG SL tablet Place 1 tablet (0.4 mg total) under the tongue every 5 (five) minutes as needed for chest pain. 25 tablet 3   No facility-administered medications prior to visit.    PAST MEDICAL HISTORY: Past Medical History:  Diagnosis Date  . Allergic rhinitis   . Anemia   . Atypical chest pain 01/07/2012   Ct chest 10/2011:  No acute process, no PE to midsized pulmonary vessels.    . Chronic migraine   . Migraines   . Miscarriage 08/1996, 12/1996   x2  . Normal labor 03/19/2014  . PONV (postoperative nausea and vomiting)   . Postoperative state 03/20/2014  . SVD (spontaneous vaginal delivery)    x 2  . Vitamin D deficiency     PAST SURGICAL HISTORY: Past Surgical History:  Procedure Laterality Date  . ABDOMINAL HYSTERECTOMY    . BREAST SURGERY     cyst removed from left breast; benign  . CESAREAN SECTION N/A 03/20/2014   Procedure: CESAREAN SECTION;  Surgeon: Daria Pastures, MD;  Location: [facility];  Service: Obstetrics;  Laterality: N/A;  . cyst removed from L breast  10/2006   benign  . CYSTOSCOPY N/A 06/01/2017   Procedure: CYSTOSCOPY;  Surgeon: Bobbye Charleston, MD;  Location: [facility];  Service: Gynecology;  Laterality: N/A;  . DILATION AND CURETTAGE OF UTERUS  08/1996, 12/1996   x 2 MAB  . DILATION AND CURETTAGE OF UTERUS    . KNEE ARTHROSCOPY Right 01/2016  . KNEE ARTHROSCOPY Right 08/2016  . LEFT HEART CATH AND CORONARY ANGIOGRAPHY N/A 04/14/2018   Procedure: LEFT  HEART CATH AND CORONARY ANGIOGRAPHY;  Surgeon: Nelva Bush, MD;  Location: [facility];  Service: Cardiovascular;  Laterality: N/A;  . live births  10/07/97, 02/08/2000   x2  . ROBOTIC ASSISTED TOTAL HYSTERECTOMY WITH SALPINGECTOMY Bilateral 06/01/2017   Procedure: ROBOTIC ASSISTED TOTAL HYSTERECTOMY WITH SALPINGECTOMY WITH PARTIAL REMOVAL OF HERNIA [place];  Surgeon: Bobbye Charleston, MD;  Location: [facility];  Service: Gynecology;  Laterality: Bilateral;  . TUBAL LIGATION Bilateral 03/20/2014   Procedure: BILATERAL TUBAL LIGATION;  Surgeon: Daria Pastures, MD;  Location: [facility];  Service: Obstetrics;  Laterality: Bilateral;  . UNILATERAL SALPINGECTOMY Right 03/20/2014   Procedure: UNILATERAL SALPINGECTOMY;  Surgeon: Daria Pastures, MD;  Location: [facility];  Service: Obstetrics;  Laterality: Right;    FAMILY HISTORY: Family History  Problem Relation Age of Onset  . Heart attack Father   . Hypertension Father   . Hyperlipidemia Father   . Asthma Son   .  Heart disease Maternal Grandfather   . Cancer Paternal Grandmother        unsure what kind    SOCIAL HISTORY: Social History   Socioeconomic History  . Marital status: Married    Spouse name: Not on file  . Number of children: 3  . Years of education: 2.5 yrs college  . Highest education level: Not on file  Occupational History  . Occupation: Production designer, theatre/television/film. Rep    Employer: [employer]  Tobacco Use  . Smoking status: Never Smoker  . Smokeless tobacco: Never Used  Vaping Use  . Vaping Use: Never used  Substance and Sexual Activity  . Alcohol use: No  . Drug use: No  . Sexual activity: Yes    Birth control/protection: Surgical  Other Topics Concern  . Not on file  Social History Narrative   Lives at home with husband and three children.   Left-handed.   No caffeine use.       Social Determinants of Health   Financial Resource Strain:   . Difficulty of Paying Living Expenses:   Food  Insecurity:   . Worried About Programme researcher, broadcasting/film/video in the Last Year:   . Barista in the Last Year:   Transportation Needs:   . Freight forwarder (Medical):   Marland Kitchen Lack of Transportation (Non-Medical):   Physical Activity:   . Days of Exercise per Week:   . Minutes of Exercise per Session:   Stress:   . Feeling of Stress :   Social Connections:   . Frequency of Communication with Friends and Family:   . Frequency of Social Gatherings with Friends and Family:   . Attends Religious Services:   . Active Member of Clubs or Organizations:   . Attends Banker Meetings:   Marland Kitchen Marital Status:   Intimate Partner Violence:   . Fear of Current or Ex-Partner:   . Emotionally Abused:   Marland Kitchen Physically Abused:   . Sexually Abused:    PHYSICAL EXAM  Vitals:   02/14/20 1523  BP: 120/82  Pulse: 60  Weight: 286 lb (129.7 kg)  Height: 5' 7.5" (1.715 m)   Body mass index is 44.13 kg/m.  Generalized: Well developed, in no acute distress   Neurological examination  Mentation: Alert oriented to time, place, history taking. Follows all commands speech and language fluent Cranial nerve II-XII: Pupils were equal round reactive to light. Extraocular movements were full, visual field were full on confrontational test. Facial sensation and strength were normal.  Head turning and shoulder shrug  were normal and symmetric. Motor: The motor testing reveals 5 over 5 strength of all 4 extremities. Good symmetric motor tone is noted throughout.  Sensory: Sensory testing is intact to soft touch on all 4 extremities. No evidence of extinction is noted.  Coordination: Cerebellar testing reveals good finger-nose-finger and heel-to-shin bilaterally.  Gait and station: Gait is normal.  Reflexes: Deep tendon reflexes are symmetric and normal bilaterally.   DIAGNOSTIC DATA (LABS, IMAGING, TESTING) - I reviewed patient records, labs, notes, testing and imaging myself where available.  Lab  Results  Component Value Date   WBC 11.6 (H) 04/26/2018   HGB 14.2 04/26/2018   HCT 41.2 04/26/2018   MCV 88 04/26/2018   PLT 458 (H) 04/26/2018      Component Value Date/Time   NA 138 04/26/2018 1204   K 4.2 04/26/2018 1204   CL 97 04/26/2018 1204   CO2 25 04/26/2018 1204  GLUCOSE 91 04/26/2018 1204   GLUCOSE 100 (H) 04/08/2018 1723   BUN 10 04/26/2018 1204   CREATININE 0.72 04/26/2018 1204   CALCIUM 9.7 04/26/2018 1204   PROT 6.1 (L) 11/21/2017 0259   ALBUMIN 3.3 (L) 11/21/2017 0259   AST 23 11/21/2017 0259   ALT 17 11/21/2017 0259   ALKPHOS 60 11/21/2017 0259   BILITOT 0.4 11/21/2017 0259   GFRNONAA 101 04/26/2018 1204   GFRAA 116 04/26/2018 1204   Lab Results  Component Value Date   CHOL 168 04/26/2018   HDL 40 04/26/2018   LDLCALC 87 04/26/2018   TRIG 205 (H) 04/26/2018   CHOLHDL 4.2 04/26/2018   Lab Results  Component Value Date   HGBA1C  06/03/2010    5.5 (NOTE)                                                                       According to the ADA Clinical Practice Recommendations for 2011, when HbA1c is used as a screening test:   >=6.5%   Diagnostic of Diabetes Mellitus           (if abnormal result  is confirmed)  5.7-6.4%   Increased risk of developing Diabetes Mellitus  References:Diagnosis and Classification of Diabetes Mellitus,Diabetes Care,2011,34(Suppl 1):S62-S69 and Standards of Medical Care in         Diabetes - 2011,Diabetes Care,2011,34  (Suppl 1):S11-S61.   No results found for: VITAMINB12 No results found for: TSH  ASSESSMENT AND PLAN 49 y.o. year old female  has a past medical history of Allergic rhinitis, Anemia, Atypical chest pain (01/07/2012), Chronic migraine, Migraines, Miscarriage (08/1996, 12/1996), Normal labor (03/19/2014), PONV (postoperative nausea and vomiting), Postoperative state (03/20/2014), SVD (spontaneous vaginal delivery), and Vitamin D deficiency. here with:  1.  Chronic migraine headache -Overall, well controlled, on  average 1-2 headaches a month -Continue atenolol 25 mg daily -Continue Zomig 5 mg as needed, may combine with tizanidine, Zofran for acute headache -Follow-up in 1 year or sooner if needed  I spent 20 minutes of face-to-face and non-face-to-face time with patient.  This included previsit chart review, lab review, study review, order entry, electronic health record documentation, patient education.  Margie Ege, AGNP-C, DNP 02/14/2020, 3:39 PM [facility] [address] [place], [place] [address] [phone]

## 2020-03-30 ENCOUNTER — Other Ambulatory Visit: Payer: Self-pay

## 2020-03-30 ENCOUNTER — Ambulatory Visit (HOSPITAL_COMMUNITY)
Admission: EM | Admit: 2020-03-30 | Discharge: 2020-03-30 | Disposition: A | Discharge status: Home / Self Care | Payer: 59 | Attending: Emergency Medicine | Admitting: Emergency Medicine

## 2020-03-30 ENCOUNTER — Ambulatory Visit (HOSPITAL_BASED_OUTPATIENT_CLINIC_OR_DEPARTMENT_OTHER): Admit: 2020-03-30 | Discharge: 2020-03-30 | Disposition: A | Discharge status: Home / Self Care | Payer: 59

## 2020-03-30 ENCOUNTER — Ambulatory Visit (INDEPENDENT_AMBULATORY_CARE_PROVIDER_SITE_OTHER): Payer: 59

## 2020-03-30 ENCOUNTER — Encounter (HOSPITAL_COMMUNITY): Payer: Self-pay

## 2020-03-30 DIAGNOSIS — Z79899 Other long term (current) drug therapy: Secondary | ICD-10-CM | POA: Diagnosis not present

## 2020-03-30 DIAGNOSIS — R0602 Shortness of breath: Secondary | ICD-10-CM

## 2020-03-30 DIAGNOSIS — M7989 Other specified soft tissue disorders: Secondary | ICD-10-CM | POA: Insufficient documentation

## 2020-03-30 DIAGNOSIS — G43909 Migraine, unspecified, not intractable, without status migrainosus: Secondary | ICD-10-CM | POA: Diagnosis not present

## 2020-03-30 DIAGNOSIS — J4521 Mild intermittent asthma with (acute) exacerbation: Secondary | ICD-10-CM | POA: Diagnosis not present

## 2020-03-30 DIAGNOSIS — Z20822 Contact with and (suspected) exposure to covid-19: Secondary | ICD-10-CM | POA: Diagnosis not present

## 2020-03-30 DIAGNOSIS — M79609 Pain in unspecified limb: Secondary | ICD-10-CM

## 2020-03-30 LAB — SARS CORONAVIRUS 2 (TAT 6-24 HRS): SARS Coronavirus 2: NEGATIVE

## 2020-03-30 MED ORDER — PREDNISONE 20 MG PO TABS: 40.0000 mg | ORAL_TABLET | Freq: Every day | ORAL | 0 refills | Status: AC

## 2020-03-30 MED ORDER — AZITHROMYCIN 250 MG PO TABS: ORAL_TABLET | ORAL | 0 refills | Status: AC

## 2020-03-30 NOTE — ED Notes (Signed)
Collected covid swab.  Verified liquid and secured cap.  Placed in lab

## 2020-03-30 NOTE — Discharge Instructions (Signed)
I will call you after your ultrasound with a final plan. If a blood clot is present we will have you go to the ER.  If not we will determine treatment after I see your xray as well-  likely steroids and potentially an antibiotic as well.  Please follow up with your pcp for recheck in the next two weeks.  Start your daily zyrtec or allegra d regularly.  Push fluids to ensure adequate hydration and keep secretions thin.  Mucinex as an expectorant may help.  If worsening- increased shortness of breath , chest pain , fevers, swelling, or otherwise worsening please go to the ER

## 2020-03-30 NOTE — Progress Notes (Signed)
VASCULAR LAB PRELIMINARY  PRELIMINARY  PRELIMINARY  PRELIMINARY  Left lower extremity venous duplex completed.    Preliminary report:  See CV proc for preliminary results.  Called [facility] with results  Patryck Kilgore, RVT 03/30/2020, 12:13 PM

## 2020-03-30 NOTE — ED Triage Notes (Signed)
Pt presents to UC for new onset shortness of breath. Pt has treated with albuterol inhaler with out relief. Pt also complaining of left lower leg swelling. Pt denies areas of redness or warmth. Pt denies recent travel. Pt denies chest pain. Pt resting on treatment table breathing even with moderate accessory muscle use. Able to speak in short sentences.

## 2020-03-31 NOTE — ED Provider Notes (Signed)
[facility]    CSN: [id number] Arrival date & time: 03/30/20  1000      History   Chief Complaint Chief Complaint  Patient presents with  . Leg Swelling  . Shortness of Breath    HPI Sheryl [name] is a 49 y.o. female.   Sheryl [name] presents with complaints of left lower leg swelling, primarily left foot. Started 4 days ago, has been worse this morning. Tight feeling. She has also had shortness of breath  And wheezing. This has actually been ongoing for the past 7 weeks, she has been given two courses of prednisone and azithromycin which have helped briefly before symptoms return. No redness to left lower leg, no injury, no numbness or tingling. She works at home, sits at [employer]. Doesn't smoke. No chest pain . No previous blood clots. No recent surgery. No cardiac history. Has had issues with wheezing, as well as with leg swelling in the past. No CHF history. No loss of taste or smell. No gi symptoms. She used her sons nebulizer which did seem to help with her wheezing. She is concerned about DVT today.    ROS per HPI, negative if not otherwise mentioned.      Past Medical History:  Diagnosis Date  . Allergic rhinitis   . Anemia   . Atypical chest pain 01/07/2012   Ct chest 10/2011:  No acute process, no PE to midsized pulmonary vessels.    . Chronic migraine   . Migraines   . Miscarriage 08/1996, 12/1996   x2  . Normal labor 03/19/2014  . PONV (postoperative nausea and vomiting)   . Postoperative state 03/20/2014  . SVD (spontaneous vaginal delivery)    x 2  . Vitamin D deficiency     Patient Active Problem List   Diagnosis Date Noted  . Chronic migraine 06/15/2017  . Postoperative state 03/20/2014  . Normal labor 03/19/2014  . Atypical chest pain 01/07/2012    Past Surgical History:  Procedure Laterality Date  . ABDOMINAL HYSTERECTOMY    . BREAST SURGERY     cyst removed from left breast; benign  . CESAREAN SECTION N/A 03/20/2014   Procedure:  CESAREAN SECTION;  Surgeon: Loney Laurence, MD;  Location: [facility];  Service: Obstetrics;  Laterality: N/A;  . cyst removed from L breast  10/2006   benign  . CYSTOSCOPY N/A 06/01/2017   Procedure: CYSTOSCOPY;  Surgeon: Carrington Clamp, MD;  Location: [facility];  Service: Gynecology;  Laterality: N/A;  . DILATION AND CURETTAGE OF UTERUS  08/1996, 12/1996   x 2 MAB  . DILATION AND CURETTAGE OF UTERUS    . KNEE ARTHROSCOPY Right 01/2016  . KNEE ARTHROSCOPY Right 08/2016  . LEFT HEART CATH AND CORONARY ANGIOGRAPHY N/A 04/14/2018   Procedure: LEFT HEART CATH AND CORONARY ANGIOGRAPHY;  Surgeon: Yvonne Kendall, MD;  Location: [facility];  Service: Cardiovascular;  Laterality: N/A;  . live births  10/07/97, 02/08/2000   x2  . ROBOTIC ASSISTED TOTAL HYSTERECTOMY WITH SALPINGECTOMY Bilateral 06/01/2017   Procedure: ROBOTIC ASSISTED TOTAL HYSTERECTOMY WITH SALPINGECTOMY WITH PARTIAL REMOVAL OF HERNIA SAC;  Surgeon: Carrington Clamp, MD;  Location: [facility];  Service: Gynecology;  Laterality: Bilateral;  . TUBAL LIGATION Bilateral 03/20/2014   Procedure: BILATERAL TUBAL LIGATION;  Surgeon: Loney Laurence, MD;  Location: [facility];  Service: Obstetrics;  Laterality: Bilateral;  . UNILATERAL SALPINGECTOMY Right 03/20/2014   Procedure: UNILATERAL SALPINGECTOMY;  Surgeon: Loney Laurence, MD;  Location: [facility];  Service: Obstetrics;  Laterality: Right;    OB History    Gravida  5   Para  3   Term  1   Preterm  2   AB  2   Living  3     SAB  2   TAB  0   Ectopic  0   Multiple      Live Births  1            Home Medications    Prior to Admission medications   Medication Sig Start Date End Date Taking? Authorizing Provider  acetaminophen (TYLENOL) 500 MG tablet Take 500-1,000 mg by mouth every 6 (six) hours as needed (for pain/headaches.).    [provider]  atenolol (TENORMIN) 25 MG tablet Take 1 tablet (25 mg total) by mouth daily. 02/14/20   Glean Salvo, NP    azithromycin (ZITHROMAX) 250 MG tablet Take 2 tablets (500 mg total) by mouth daily for 1 day, THEN 1 tablet (250 mg total) daily for 4 days. 03/30/20 04/04/20  Georgetta Haber, NP  cetirizine (ZYRTEC) 10 MG tablet Take 10 mg by mouth daily as needed for allergies ((hay fever/pollen)).     [provider]  Cyanocobalamin 1000 MCG CAPS 1,000 mcg.    [provider]  Multiple Vitamin (MULTIVITAMIN WITH MINERALS) TABS tablet Take 1 tablet by mouth daily.    [provider]  nitroGLYCERIN (NITROSTAT) 0.4 MG SL tablet Place 1 tablet (0.4 mg total) under the tongue every 5 (five) minutes as needed for chest pain. 04/11/18 07/10/18  Wendall Stade, MD  ondansetron (ZOFRAN ODT) 4 MG disintegrating tablet Take 1 tablet (4 mg total) by mouth every 8 (eight) hours as needed. 11/12/19   Glean Salvo, NP  predniSONE (DELTASONE) 20 MG tablet Take 2 tablets (40 mg total) by mouth daily with breakfast for 5 days. 03/30/20 04/04/20  Georgetta Haber, NP  tiZANidine (ZANAFLEX) 4 MG tablet TAKE 1 TABLET BY MOUTH EVERY 6 HOURS AS NEEDED FOR MUSCLE SPASMS 11/12/19   Glean Salvo, NP  zolmitriptan (ZOMIG) 5 MG tablet Take 1 tab at onset of migraine.  May repeat in 2 hrs, if needed.  Max dose: 2 tabs/day. This is a 30 day prescription. 11/12/19   Glean Salvo, NP    Family History Family History  Problem Relation Age of Onset  . Heart attack Father   . Hypertension Father   . Hyperlipidemia Father   . Asthma Son   . Heart disease Maternal Grandfather   . Cancer Paternal Grandmother        unsure what kind    Social History Social History   Tobacco Use  . Smoking status: Never Smoker  . Smokeless tobacco: Never Used  Vaping Use  . Vaping Use: Never used  Substance Use Topics  . Alcohol use: No  . Drug use: No     Allergies   Advil [ibuprofen], Salicylates, and Fentanyl   Review of Systems Review of Systems   Physical Exam Triage Vital Signs ED Triage Vitals  Enc Vitals  Group     BP 03/30/20 1022 112/72     Pulse Rate 03/30/20 1014 83     Resp 03/30/20 1014 18     Temp 03/30/20 1014 98.2 F (36.8 C)     Temp Source 03/30/20 1014 Oral     SpO2 03/30/20 1014 99 %     Weight --  Height --      Head Circumference --      Peak Flow --      Pain Score 03/30/20 1016 0     Pain Loc --      Pain Edu? --      Excl. in GC? --    No data found.  Updated Vital Signs BP 112/72 (BP Location: Right Arm)   Pulse 83   Temp 98.2 F (36.8 C) (Oral)   Resp 18   LMP 05/26/2017 (Exact Date)   SpO2 99%   Visual Acuity Right Eye Distance:   Left Eye Distance:   Bilateral Distance:    Right Eye Near:   Left Eye Near:    Bilateral Near:     Physical Exam Constitutional:      General: She is not in acute distress.    Appearance: She is well-developed.  Cardiovascular:     Rate and Rhythm: Normal rate.  Pulmonary:     Effort: Pulmonary effort is normal.     Breath sounds: Wheezing present.     Comments: A few scattered wheezes noted  Lymphadenopathy:     Comments: +1 edema to left foot and lower leg, no redness or warmth; no calf pain or firmness; non tender; ambulatory without difficulty   Skin:    General: Skin is warm and dry.  Neurological:     Mental Status: She is alert and oriented to person, place, and time.      UC Treatments / Results  Labs (all labs ordered are listed, but only abnormal results are displayed) Labs Reviewed  SARS CORONAVIRUS 2 (TAT 6-24 HRS)    EKG   Radiology DG Chest 2 View  Result Date: 03/30/2020 CLINICAL DATA:  Short of breath with leg swelling EXAM: CHEST - 2 VIEW COMPARISON:  04/08/2018 FINDINGS: The heart size and mediastinal contours are within normal limits. Both lungs are clear. The visualized skeletal structures are unremarkable. IMPRESSION: No active cardiopulmonary disease. Electronically Signed   By: Signa Kell M.D.   On: 03/30/2020 11:15   VAS [place] LOWER EXTREMITY VENOUS (DVT)  Result  Date: 03/30/2020  Lower Venous DVTStudy Indications: Pain.  Comparison Study: No prior study on file for comparison Performing Technologist: Sherren Kerns RVS  Examination Guidelines: A complete evaluation includes B-mode imaging, spectral Doppler, color Doppler, and power Doppler as needed of all accessible portions of each vessel. Bilateral testing is considered an integral part of a complete examination. Limited examinations for reoccurring indications may be performed as noted. The reflux portion of the exam is performed with the patient in reverse Trendelenburg.  +-----+---------------+---------+-----------+----------+--------------+ RIGHTCompressibilityPhasicitySpontaneityPropertiesThrombus Aging +-----+---------------+---------+-----------+----------+--------------+ CFV  Full           Yes      Yes                                 +-----+---------------+---------+-----------+----------+--------------+   +---------+---------------+---------+-----------+----------+--------------+ LEFT     CompressibilityPhasicitySpontaneityPropertiesThrombus Aging +---------+---------------+---------+-----------+----------+--------------+ CFV      Full           Yes      Yes                                 +---------+---------------+---------+-----------+----------+--------------+ SFJ      Full                                                        +---------+---------------+---------+-----------+----------+--------------+  FV Prox  Full                                                        +---------+---------------+---------+-----------+----------+--------------+ FV Mid   Full                                                        +---------+---------------+---------+-----------+----------+--------------+ FV DistalFull                                                        +---------+---------------+---------+-----------+----------+--------------+ PFV      Full                                                         +---------+---------------+---------+-----------+----------+--------------+ POP      Full           Yes      Yes                                 +---------+---------------+---------+-----------+----------+--------------+ PTV      Full                                                        +---------+---------------+---------+-----------+----------+--------------+ PERO     Full                                                        +---------+---------------+---------+-----------+----------+--------------+ SSV      Full                                                        +---------+---------------+---------+-----------+----------+--------------+     Summary: RIGHT: - No evidence of common femoral vein obstruction.  LEFT: - There is no evidence of deep vein thrombosis in the lower extremity.  *See table(s) above for measurements and observations. Electronically signed by Sherald Hesshristopher Clark MD on 03/30/2020 at 3:43:29 PM.    Final     Procedures Procedures (including critical care time)  Medications Ordered in UC Medications - No data to display  Initial Impression / Assessment and Plan / UC Course  I have reviewed the triage vital signs and the nursing notes.  Pertinent labs & imaging results that were available during my care of the patient were reviewed  by me and considered in my medical decision making (see chart for details).     Chest xray clear. Negative lower extremity doppler without any risk factors for PE. Has been dealing with wheezing for some time now, steroids have been helpful. Not necessarily new for her unfortunately. Suspect this is still asthma flare. Return precautions provided. Patient verbalized understanding and agreeable to plan.   Final Clinical Impressions(s) / UC Diagnoses   Final diagnoses:  Shortness of breath  Left leg swelling  Mild intermittent asthma with exacerbation      Discharge Instructions     I will call you after your ultrasound with a final plan. If a blood clot is present we will have you go to the ER.  If not we will determine treatment after I see your xray as well-  likely steroids and potentially an antibiotic as well.  Please follow up with your pcp for recheck in the next two weeks.  Start your daily zyrtec or allegra d regularly.  Push fluids to ensure adequate hydration and keep secretions thin.  Mucinex as an expectorant may help.  If worsening- increased shortness of breath , chest pain , fevers, swelling, or otherwise worsening please go to the ER    ED Prescriptions    Medication Sig Dispense Auth. Provider   azithromycin (ZITHROMAX) 250 MG tablet Take 2 tablets (500 mg total) by mouth daily for 1 day, THEN 1 tablet (250 mg total) daily for 4 days. 6 tablet Linus Mako B, NP   predniSONE (DELTASONE) 20 MG tablet Take 2 tablets (40 mg total) by mouth daily with breakfast for 5 days. 10 tablet Georgetta Haber, NP     PDMP not reviewed this encounter.   Georgetta Haber, NP 03/31/20 1042

## 2020-05-28 NOTE — Progress Notes (Signed)
I have reviewed and agreed above plan. 

## 2020-07-14 ENCOUNTER — Telehealth: Payer: Self-pay | Admitting: Neurology

## 2020-07-14 NOTE — Telephone Encounter (Signed)
Received a PA request for zolmitriptan 5mg . PA was started on . Key is [email or web address]. Per [email or web address], a determination will be made within the next 72 hours.   Will check back later for updates.

## 2020-07-23 ENCOUNTER — Other Ambulatory Visit: Payer: Self-pay | Admitting: Orthopedic Surgery

## 2020-07-23 DIAGNOSIS — M25562 Pain in left knee: Secondary | ICD-10-CM

## 2020-08-11 ENCOUNTER — Other Ambulatory Visit: Payer: Self-pay | Admitting: Family Medicine

## 2020-08-11 DIAGNOSIS — R600 Localized edema: Secondary | ICD-10-CM

## 2020-08-14 ENCOUNTER — Ambulatory Visit
Admission: RE | Admit: 2020-08-14 | Discharge: 2020-08-14 | Disposition: A | Discharge status: Home / Self Care | Payer: 59 | Source: Ambulatory Visit | Attending: Orthopedic Surgery | Admitting: Orthopedic Surgery

## 2020-08-14 ENCOUNTER — Other Ambulatory Visit: Payer: Self-pay

## 2020-08-14 DIAGNOSIS — M25562 Pain in left knee: Secondary | ICD-10-CM

## 2020-08-26 ENCOUNTER — Other Ambulatory Visit: Payer: 59

## 2020-11-21 ENCOUNTER — Other Ambulatory Visit: Payer: Self-pay | Admitting: Obstetrics and Gynecology

## 2020-11-21 DIAGNOSIS — N63 Unspecified lump in unspecified breast: Secondary | ICD-10-CM

## 2020-12-04 ENCOUNTER — Other Ambulatory Visit: Payer: Self-pay

## 2020-12-04 ENCOUNTER — Ambulatory Visit: Admission: RE | Admit: 2020-12-04 | Payer: 59 | Source: Ambulatory Visit

## 2020-12-04 ENCOUNTER — Ambulatory Visit
Admission: RE | Admit: 2020-12-04 | Discharge: 2020-12-04 | Disposition: A | Discharge status: Home / Self Care | Payer: 59 | Source: Ambulatory Visit | Attending: Obstetrics and Gynecology | Admitting: Obstetrics and Gynecology

## 2020-12-04 DIAGNOSIS — N63 Unspecified lump in unspecified breast: Secondary | ICD-10-CM

## 2020-12-16 ENCOUNTER — Other Ambulatory Visit: Payer: 59

## 2020-12-18 ENCOUNTER — Other Ambulatory Visit: Payer: Self-pay | Admitting: Neurology

## 2021-02-19 ENCOUNTER — Telehealth: Payer: Self-pay | Admitting: *Deleted

## 2021-02-19 ENCOUNTER — Ambulatory Visit (INDEPENDENT_AMBULATORY_CARE_PROVIDER_SITE_OTHER): Payer: 59 | Admitting: Neurology

## 2021-02-19 ENCOUNTER — Encounter: Payer: Self-pay | Admitting: Neurology

## 2021-02-19 VITALS — BP 106/71 | HR 68 | Ht 67.0 in | Wt 288.0 lb

## 2021-02-19 DIAGNOSIS — G43709 Chronic migraine without aura, not intractable, without status migrainosus: Secondary | ICD-10-CM

## 2021-02-19 MED ORDER — ONDANSETRON 4 MG PO TBDP: 4.0000 mg | ORAL_TABLET | Freq: Three times a day (TID) | ORAL | 11 refills | Status: DC | PRN

## 2021-02-19 MED ORDER — TIZANIDINE HCL 4 MG PO TABS: ORAL_TABLET | ORAL | 6 refills | Status: DC

## 2021-02-19 MED ORDER — NURTEC 75 MG PO TBDP: 75.0000 mg | ORAL_TABLET | ORAL | 11 refills | Status: DC | PRN

## 2021-02-19 MED ORDER — ATENOLOL 25 MG PO TABS: 25.0000 mg | ORAL_TABLET | Freq: Every day | ORAL | 4 refills | Status: DC

## 2021-02-19 NOTE — Progress Notes (Signed)
PATIENT: Sheryl [name] DOB: 06/18/1971  REASON FOR VISIT: follow up HISTORY FROM: patient  HISTORY OF PRESENT ILLNESS: Today 02/19/21  HISTORY Sheryl [name] is a 50 years old female, seen in refer by  her primary care doctor Johny BlamerHarris, William, for evaluation of frequent migraine headache initial evaluation was June 15 2017.   I have reviewed the record, she has a long history of migraine headaches since teenager, gradually getting worse, began to see Dr. Vela ProseLewitt in 2008, at that time she reported almost daily headaches,   She was treated with preventive medication atenolol 50 mg daily, which is helpful, previously also tried amitriptyline, nortriptyline, verapamil, which all helped some, but gradually loses benefit,   She was able to identify triggers such as cheese, onion, stress, caffeine, chocolate, heat,   Her headache overall has much improved, she is now taking Relpax as needed, that is the only triptan she has tried, at its best, it would take away her headache within one hour, but sometimes her headache would last for half days, occasionally she received Dilaudid, Phenergan and Demerol as abortive treatment   Her typical migraine are right lateralized severe pounding headache with associated light noise sensitivity, nauseous, vomiting, sometimes involving left side,   She was admitted to the hospital in September 2011 for 1 prolonged episode of headache with associated right side weakness and blurry vision, she had extensive evaluations, MRI of the brain showed no acute abnormality. Echocardiogram showed ejection fraction 65%, transcranial Doppler study was normal,   UPDATE Oct 27 2017:YY Her father passed away on Jul 06 2017, she complains of increased headaches, 8-10 migraines each month,Maxalt does help her migraine, she felt wiped out afterwards     UPDATE Oct 31 2018: She is doing very well with atenolol 25 mg every day as headache prevention, on he has 1-2 headaches each  month, responding well to Relpax,   Update November 12, 2019 SS: Via virtual visit, reports headaches had been doing really well, for the last 30 days, has had 6 migraines.  Usually, she only has 2 a month.  Attributed to increased stress, her son is having a baby unexpectedly.  Currently, Zomig works well for her, but she only gets 3 tablets a month per insurance.  She remains on atenolol.   Update February 14, 2020 SS: After last visit, atenolol was increased to 50 mg for migraine prevention due to worsening headaches associated with increased stress.  She only took the higher dose for 1 month, went back down to 25 mg daily.  She is doing well.  On average 1-2 headaches a month. She has Zomig, Zanaflex, Zofran for acute headache-these work well for her. She only gets 3 Zomig tablets a month.  She is now exercising, trying to stay out of the heat which can be a trigger for migraines.  Presents today for evaluation unaccompanied. She is expecting her first grandchild.  Update February 19, 2021 SS: On average 1-2 migraines a month, April had none. This month has had 2, heat related. Insurance won't pay for Relpax this works best. For rescue now: Zomig, Zofran, tizanidine. Has slight headache today, came home from beach after 1 week. Didn't feel higher dose of atenolol was helpful.   REVIEW OF SYSTEMS: Out of a complete 14 system review of symptoms, the patient complains only of the following symptoms, and all other reviewed systems are negative.  Headache  ALLERGIES: Allergies  Allergen Reactions   Advil [Ibuprofen] Anaphylaxis and  Hives   Salicylates Anaphylaxis and Hives    Unknown reaction Sulfur meds   Fentanyl Nausea And Vomiting    Migraine, vomiting    HOME MEDICATIONS: Outpatient Medications Prior to Visit  Medication Sig Dispense Refill   acetaminophen (TYLENOL) 500 MG tablet Take 500-1,000 mg by mouth every 6 (six) hours as needed (for pain/headaches.).     albuterol (VENTOLIN HFA) 108 (90  Base) MCG/ACT inhaler 2 puffs     budesonide-formoterol (SYMBICORT) 80-4.5 MCG/ACT inhaler Symbicort 80 mcg-4.5 mcg/actuation HFA aerosol inhaler  Inhale 2 puffs twice a day by inhalation route.     cetirizine (ZYRTEC) 10 MG tablet Take 10 mg by mouth daily as needed for allergies ((hay fever/pollen)).      Multiple Vitamin (MULTIVITAMIN WITH MINERALS) TABS tablet Take 1 tablet by mouth daily.     zolmitriptan (ZOMIG) 5 MG tablet TAKE 1 TABLET BY MOUTH AT ONSET OF MIGRAINE. MAY REPEAT IN 2 HOURS IF NEEDED. MAX 2 IN 24 HOURS 12 tablet 11   atenolol (TENORMIN) 25 MG tablet Take 1 tablet (25 mg total) by mouth daily. 90 tablet 4   ondansetron (ZOFRAN ODT) 4 MG disintegrating tablet Take 1 tablet (4 mg total) by mouth every 8 (eight) hours as needed. 20 tablet 11   tiZANidine (ZANAFLEX) 4 MG tablet TAKE 1 TABLET BY MOUTH EVERY 6 HOURS AS NEEDED FOR MUSCLE SPASMS 30 tablet 6   Cyanocobalamin 1000 MCG CAPS 1,000 mcg.     nitroGLYCERIN (NITROSTAT) 0.4 MG SL tablet Place 1 tablet (0.4 mg total) under the tongue every 5 (five) minutes as needed for chest pain. 25 tablet 3   No facility-administered medications prior to visit.    PAST MEDICAL HISTORY: Past Medical History:  Diagnosis Date   Allergic rhinitis    Anemia    Atypical chest pain 01/07/2012   Ct chest 10/2011:  No acute process, no PE to midsized pulmonary vessels.     Chronic migraine    Migraines    Miscarriage 08/1996, 12/1996   x2   Normal labor 03/19/2014   PONV (postoperative nausea and vomiting)    Postoperative state 03/20/2014   SVD (spontaneous vaginal delivery)    x 2   Vitamin D deficiency     PAST SURGICAL HISTORY: Past Surgical History:  Procedure Laterality Date   ABDOMINAL HYSTERECTOMY     BREAST SURGERY     cyst removed from left breast; benign   CESAREAN SECTION N/A 03/20/2014   Procedure: CESAREAN SECTION;  Surgeon: Loney Laurence, MD;  Location: [facility];  Service: Obstetrics;  Laterality: N/A;   cyst removed  from L breast  10/2006   benign   CYSTOSCOPY N/A 06/01/2017   Procedure: CYSTOSCOPY;  Surgeon: Carrington Clamp, MD;  Location: [facility];  Service: Gynecology;  Laterality: N/A;   DILATION AND CURETTAGE OF UTERUS  08/1996, 12/1996   x 2 MAB   DILATION AND CURETTAGE OF UTERUS     KNEE ARTHROSCOPY Right 01/2016   KNEE ARTHROSCOPY Right 08/2016   LEFT HEART CATH AND CORONARY ANGIOGRAPHY N/A 04/14/2018   Procedure: LEFT HEART CATH AND CORONARY ANGIOGRAPHY;  Surgeon: Yvonne Kendall, MD;  Location: [facility];  Service: Cardiovascular;  Laterality: N/A;   live births  10/07/97, 02/08/2000   x2   ROBOTIC ASSISTED TOTAL HYSTERECTOMY WITH SALPINGECTOMY Bilateral 06/01/2017   Procedure: ROBOTIC ASSISTED TOTAL HYSTERECTOMY WITH SALPINGECTOMY WITH PARTIAL REMOVAL OF HERNIA SAC;  Surgeon: Carrington Clamp, MD;  Location: [facility];  Service: Gynecology;  Laterality: Bilateral;   TUBAL LIGATION Bilateral 03/20/2014   Procedure: BILATERAL TUBAL LIGATION;  Surgeon: Loney Laurence, MD;  Location: [facility];  Service: Obstetrics;  Laterality: Bilateral;   UNILATERAL SALPINGECTOMY Right 03/20/2014   Procedure: UNILATERAL SALPINGECTOMY;  Surgeon: Loney Laurence, MD;  Location: [facility];  Service: Obstetrics;  Laterality: Right;    FAMILY HISTORY: Family History  Problem Relation Age of Onset   Heart attack Father    Hypertension Father    Hyperlipidemia Father    Asthma Son    Heart disease Maternal Grandfather    Cancer Paternal Grandmother        unsure what kind    SOCIAL HISTORY: Social History   Socioeconomic History   Marital status: Married    Spouse name: Not on file   Number of children: 3   Years of education: 2.5 yrs college   Highest education level: Not on file  Occupational History   Occupation: Production designer, theatre/television/film. Rep    Employer: [employer]  Tobacco Use   Smoking status: Never   Smokeless tobacco: Never  Vaping Use   Vaping Use: Never used  Substance  and Sexual Activity   Alcohol use: No   Drug use: No   Sexual activity: Yes    Birth control/protection: Surgical  Other Topics Concern   Not on file  Social History Narrative   Lives at home with husband and three children.   Left-handed.   No caffeine use.       Social Determinants of Health   Financial Resource Strain: Not on file  Food Insecurity: Not on file  Transportation Needs: Not on file  Physical Activity: Not on file  Stress: Not on file  Social Connections: Not on file  Intimate Partner Violence: Not on file   PHYSICAL EXAM  Vitals:   02/19/21 1507  BP: 106/71  Pulse: 68  Weight: 288 lb (130.6 kg)  Height: 5\' 7"  (1.702 m)    Body mass index is 45.11 kg/m.  Generalized: Well developed, in no acute distress   Neurological examination  Mentation: Alert oriented to time, place, history taking. Follows all commands speech and language fluent Cranial nerve II-XII: Pupils were equal round reactive to light. Extraocular movements were full, visual field were full on confrontational test. Facial sensation and strength were normal.  Head turning and shoulder shrug  were normal and symmetric. Motor: The motor testing reveals 5 over 5 strength of all 4 extremities. Good symmetric motor tone is noted throughout.  Sensory: Sensory testing is intact to soft touch on all 4 extremities. No evidence of extinction is noted.  Coordination: Cerebellar testing reveals good finger-nose-finger and heel-to-shin bilaterally.  Gait and station: Gait is normal.  Reflexes: Deep tendon reflexes are symmetric and normal bilaterally.   DIAGNOSTIC DATA (LABS, IMAGING, TESTING) - I reviewed patient records, labs, notes, testing and imaging myself where available.  Lab Results  Component Value Date   WBC 11.6 (H) 04/26/2018   HGB 14.2 04/26/2018   HCT 41.2 04/26/2018   MCV 88 04/26/2018   PLT 458 (H) 04/26/2018      Component Value Date/Time   NA 138 04/26/2018 1204   K 4.2  04/26/2018 1204   CL 97 04/26/2018 1204   CO2 25 04/26/2018 1204   GLUCOSE 91 04/26/2018 1204   GLUCOSE 100 (H) 04/08/2018 1723   BUN 10 04/26/2018 1204   CREATININE 0.72 04/26/2018 1204   CALCIUM 9.7 04/26/2018 1204   PROT 6.1 (  L) 11/21/2017 0259   ALBUMIN 3.3 (L) 11/21/2017 0259   AST 23 11/21/2017 0259   ALT 17 11/21/2017 0259   ALKPHOS 60 11/21/2017 0259   BILITOT 0.4 11/21/2017 0259   GFRNONAA 101 04/26/2018 1204   GFRAA 116 04/26/2018 1204   Lab Results  Component Value Date   CHOL 168 04/26/2018   HDL 40 04/26/2018   LDLCALC 87 04/26/2018   TRIG 205 (H) 04/26/2018   CHOLHDL 4.2 04/26/2018   Lab Results  Component Value Date   HGBA1C  06/03/2010    5.5 (NOTE)                                                                       According to the ADA Clinical Practice Recommendations for 2011, when HbA1c is used as a screening test:   >=6.5%   Diagnostic of Diabetes Mellitus           (if abnormal result  is confirmed)  5.7-6.4%   Increased risk of developing Diabetes Mellitus  References:Diagnosis and Classification of Diabetes Mellitus,Diabetes Care,2011,34(Suppl 1):S62-S69 and Standards of Medical Care in         Diabetes - 2011,Diabetes Care,2011,34  (Suppl 1):S11-S61.   No results found for: VITAMINB12 No results found for: TSH  ASSESSMENT AND PLAN 50 y.o. year old female  has a past medical history of Allergic rhinitis, Anemia, Atypical chest pain (01/07/2012), Chronic migraine, Migraines, Miscarriage (08/1996, 12/1996), Normal labor (03/19/2014), PONV (postoperative nausea and vomiting), Postoperative state (03/20/2014), SVD (spontaneous vaginal delivery), and Vitamin D deficiency. here with:  1.  Chronic migraine headache  -Have been well controlled, but concern for increase with the heat  -For now, we will continue atenolol 25 mg daily, has tried 50 mg, did not notice any difference (may consider CGRP if needed) -Will try Nurtec for acute headache (previously tried  Imitrex, Maxalt, Zomig, Relpax), may continue Zofran, tizanidine PRN for acute headache -Follow-up in 6 months or sooner if needed  Otila Kluver, DNP 02/19/2021, 3:38 PM [facility] [address] [place], [place] [address] [phone]

## 2021-02-19 NOTE — Patient Instructions (Signed)
Try Nurtec for acute headache, take 1 at onset of headache, max is 1 tablet in 24 hours  Continue other medications See you back in 1 year

## 2021-02-19 NOTE — Telephone Encounter (Signed)
PA for Nurtec 75mg  started on covermymeds (key: [id number]). Pt has pharmacy coverage w/ [employer] ). Decision pending.

## 2021-02-23 MED ORDER — NURTEC 75 MG PO TBDP: 75.0000 mg | ORAL_TABLET | ORAL | 11 refills | Status: DC | PRN

## 2021-02-23 NOTE — Addendum Note (Signed)
Addended by: Lilla Shook on: 02/23/2021 07:57 AM   Modules accepted: Orders

## 2021-02-23 NOTE — Telephone Encounter (Signed)
[id number] approved through 02/19/2022. Insurance plan will only cover #8/30.

## 2021-05-20 NOTE — Progress Notes (Signed)
Chart reviewed, agree above plan ?

## 2021-06-14 ENCOUNTER — Emergency Department (HOSPITAL_COMMUNITY)
Admission: EM | Admit: 2021-06-14 | Discharge: 2021-06-14 | Disposition: A | Discharge status: Home / Self Care | Payer: 59 | Attending: Emergency Medicine | Admitting: Emergency Medicine

## 2021-06-14 ENCOUNTER — Encounter (HOSPITAL_COMMUNITY): Payer: Self-pay | Admitting: Emergency Medicine

## 2021-06-14 ENCOUNTER — Other Ambulatory Visit: Payer: Self-pay

## 2021-06-14 ENCOUNTER — Emergency Department (HOSPITAL_COMMUNITY): Payer: 59

## 2021-06-14 DIAGNOSIS — R0602 Shortness of breath: Secondary | ICD-10-CM | POA: Diagnosis present

## 2021-06-14 DIAGNOSIS — I719 Aortic aneurysm of unspecified site, without rupture: Secondary | ICD-10-CM | POA: Diagnosis not present

## 2021-06-14 DIAGNOSIS — I7121 Aneurysm of the ascending aorta, without rupture: Secondary | ICD-10-CM

## 2021-06-14 DIAGNOSIS — R0789 Other chest pain: Secondary | ICD-10-CM

## 2021-06-14 LAB — COMPREHENSIVE METABOLIC PANEL
ALT: 19 U/L (ref 0–44)
AST: 28 U/L (ref 15–41)
Albumin: 3.6 g/dL (ref 3.5–5.0)
Alkaline Phosphatase: 61 U/L (ref 38–126)
Anion gap: 10 (ref 5–15)
BUN: 9 mg/dL (ref 6–20)
CO2: 27 mmol/L (ref 22–32)
Calcium: 9.2 mg/dL (ref 8.9–10.3)
Chloride: 103 mmol/L (ref 98–111)
Creatinine, Ser: 0.74 mg/dL (ref 0.44–1.00)
GFR, Estimated: >60 mL/min (ref >60)
Glucose, Bld: 108 mg/dL — ABNORMAL HIGH (ref 70–99)
Potassium: 4.1 mmol/L (ref 3.5–5.1)
Sodium: 140 mmol/L (ref 135–145)
Total Bilirubin: 0.7 mg/dL (ref 0.3–1.2)
Total Protein: 6.7 g/dL (ref 6.5–8.1)

## 2021-06-14 LAB — CBC
HCT: 43.3 % (ref 36.0–46.0)
Hemoglobin: 14.3 g/dL (ref 12.0–15.0)
MCH: 29.9 pg (ref 26.0–34.0)
MCHC: 33 g/dL (ref 30.0–36.0)
MCV: 90.6 fL (ref 80.0–100.0)
Platelets: 363 10*3/uL (ref 150–400)
RBC: 4.78 MIL/uL (ref 3.87–5.11)
RDW: 12.4 % (ref 11.5–15.5)
WBC: 8.1 10*3/uL (ref 4.0–10.5)
nRBC: 0 % (ref 0.0–0.2)

## 2021-06-14 LAB — TROPONIN I (HIGH SENSITIVITY)
Troponin I (High Sensitivity): 3 ng/L (ref <18)
Troponin I (High Sensitivity): 3 ng/L (ref <18)

## 2021-06-14 MED ORDER — IOHEXOL 350 MG/ML SOLN
80.0000 mL | Freq: Once | INTRAVENOUS | Status: AC | PRN
  Administered 2021-06-14: 80 mL via INTRAVENOUS

## 2021-06-14 NOTE — Discharge Instructions (Addendum)
Please continue to monitor your symptoms closely.  If you develop any new or worsening symptoms please come back to the emergency department immediately.  I would recommend following up with your cardiologist regarding your symptoms today.  Your CT scan showed evidence of an ascending thoracic aortic aneurysm.  Your case was discussed with cardiothoracic surgery and they recommend having you follow-up outpatient in their office.  Their contact information is below.  It was a pleasure to meet you.

## 2021-06-14 NOTE — ED Notes (Signed)
Pt transported to CT ?

## 2021-06-14 NOTE — ED Provider Notes (Signed)
Emergency Medicine Provider Triage Evaluation Note  Sheryl [name] , a 50 y.o. female  was evaluated in triage.  Pt complains of chest pain that began while at the supermarket today.  Describes it as a sharp pain to the left side of her chest with radiation to her left arm.  Similar episode 3 years ago, where she had a cardiac cath which revealed no catch.  She does have a cardiologist Dr. Eden [name], but reports she has not seen him in about 3 years.  Edition patient does report "I been under a lot of stress ", he was given aspirin by EMS with resolution in symptoms.  Review of Systems  Positive: Chest pain, left arm pain Negative: Shortness of breath  Physical Exam  BP (!) 125/91 (BP Location: Left Arm)   Pulse 83   Temp 98.4 F (36.9 C)   LMP 05/26/2017 (Exact Date)   SpO2 100%  Gen:   Awake, no distress   Resp:  Normal effort  MSK:   Moves extremities without difficulty Other:   Medical Decision Making  Medically screening exam initiated at 2:13 PM.  Appropriate orders placed.  Sheryl [name] was informed that the remainder of the evaluation will be completed by another provider, this initial triage assessment does not replace that evaluation, and the importance of remaining in the ED until their evaluation is complete.     Claude Manges, PA-C 06/14/21 1417    Gerhard Munch, MD 06/14/21 1651

## 2021-06-14 NOTE — ED Triage Notes (Signed)
Pt to triage via [facility] from grocery store.  Reports history of angina.  Reports chest pain, dizziness, generalized weakness and SOB while shopping.  Initial BP lying 90/.  20g RAC.  NS 100cc.  Denies pain at present.

## 2021-06-14 NOTE — ED Notes (Signed)
Unable to get lab 

## 2021-06-14 NOTE — ED Provider Notes (Signed)
[facility] EMERGENCY DEPARTMENT Provider Note   CSN: [id number] Arrival date & time: 06/14/21  1350     History Chief Complaint  Patient presents with   Chest Pain    Sheryl [name] is a 51 y.o. female.  HPI Patient is a 50 year old female with history of atypical chest pain, migraines, antiphospholipid antibody syndrome, who presents to the emergency department due to left-sided chest pain and shortness of breath.  Symptoms started prior to arrival while at the grocery store.  Patient states her pain was sharp and began along the left side of her chest and radiated down her left arm.  This lasted about 15 minutes before resolving.  EMS was called to the scene and gave patient 324 mg of ASA.  She states her symptoms have since resolved.  Denies any nausea or vomiting.  Patient notes during the encounter that she initially experienced tunnel vision and also had a near syncopal episode.  Patient reports intermittent left leg swelling that has been going on for more than a year.  She states that she had an ultrasound on the left leg last year that was negative for DVT.  Patient notes recently that she has been under a significant amount of stress.  Patient is not a smoker.  States that her father died of an MI at 82 years of age.  Denies any history of blood clots.    Past Medical History:  Diagnosis Date   Allergic rhinitis    Anemia    Atypical chest pain 01/07/2012   Ct chest 10/2011:  No acute process, no PE to midsized pulmonary vessels.     Chronic migraine    Migraines    Miscarriage 08/1996, 12/1996   x2   Normal labor 03/19/2014   PONV (postoperative nausea and vomiting)    Postoperative state 03/20/2014   SVD (spontaneous vaginal delivery)    x 2   Vitamin D deficiency     Patient Active Problem List   Diagnosis Date Noted   Chronic migraine w/o aura w/o status migrainosus, not intractable 06/15/2017   Postoperative state 03/20/2014   Normal labor  03/19/2014   Atypical chest pain 01/07/2012    Past Surgical History:  Procedure Laterality Date   ABDOMINAL HYSTERECTOMY     BREAST SURGERY     cyst removed from left breast; benign   CESAREAN SECTION N/A 03/20/2014   Procedure: CESAREAN SECTION;  Surgeon: Loney Laurence, MD;  Location: [facility];  Service: Obstetrics;  Laterality: N/A;   cyst removed from L breast  10/2006   benign   CYSTOSCOPY N/A 06/01/2017   Procedure: CYSTOSCOPY;  Surgeon: Carrington Clamp, MD;  Location: [facility];  Service: Gynecology;  Laterality: N/A;   DILATION AND CURETTAGE OF UTERUS  08/1996, 12/1996   x 2 MAB   DILATION AND CURETTAGE OF UTERUS     KNEE ARTHROSCOPY Right 01/2016   KNEE ARTHROSCOPY Right 08/2016   LEFT HEART CATH AND CORONARY ANGIOGRAPHY N/A 04/14/2018   Procedure: LEFT HEART CATH AND CORONARY ANGIOGRAPHY;  Surgeon: Yvonne Kendall, MD;  Location: [facility];  Service: Cardiovascular;  Laterality: N/A;   live births  10/07/97, 02/08/2000   x2   ROBOTIC ASSISTED TOTAL HYSTERECTOMY WITH SALPINGECTOMY Bilateral 06/01/2017   Procedure: ROBOTIC ASSISTED TOTAL HYSTERECTOMY WITH SALPINGECTOMY WITH PARTIAL REMOVAL OF HERNIA SAC;  Surgeon: Carrington Clamp, MD;  Location: [facility];  Service: Gynecology;  Laterality: Bilateral;   TUBAL LIGATION Bilateral 03/20/2014   Procedure:  BILATERAL TUBAL LIGATION;  Surgeon: Loney Laurence, MD;  Location: [facility];  Service: Obstetrics;  Laterality: Bilateral;   UNILATERAL SALPINGECTOMY Right 03/20/2014   Procedure: UNILATERAL SALPINGECTOMY;  Surgeon: Loney Laurence, MD;  Location: [facility];  Service: Obstetrics;  Laterality: Right;     OB History     Gravida  5   Para  3   Term  1   Preterm  2   AB  2   Living  3      SAB  2   IAB  0   Ectopic  0   Multiple      Live Births  1           Family History  Problem Relation Age of Onset   Heart attack Father    Hypertension Father    Hyperlipidemia Father    Asthma Son    Heart  disease Maternal Grandfather    Cancer Paternal Grandmother        unsure what kind    Social History   Tobacco Use   Smoking status: Never   Smokeless tobacco: Never  Vaping Use   Vaping Use: Never used  Substance Use Topics   Alcohol use: No   Drug use: No    Home Medications Prior to Admission medications   Medication Sig Start Date End Date Taking? Authorizing Provider  acetaminophen (TYLENOL) 500 MG tablet Take 500-1,000 mg by mouth every 6 (six) hours as needed (for pain/headaches.).    [provider]  albuterol (VENTOLIN HFA) 108 (90 Base) MCG/ACT inhaler 2 puffs 05/21/20   [provider]  atenolol (TENORMIN) 25 MG tablet Take 1 tablet (25 mg total) by mouth daily. 02/19/21   Glean Salvo, NP  budesonide-formoterol (SYMBICORT) 80-4.5 MCG/ACT inhaler Symbicort 80 mcg-4.5 mcg/actuation HFA aerosol inhaler  Inhale 2 puffs twice a day by inhalation route.    [provider]  cetirizine (ZYRTEC) 10 MG tablet Take 10 mg by mouth daily as needed for allergies ((hay fever/pollen)).     [provider]  Multiple Vitamin (MULTIVITAMIN WITH MINERALS) TABS tablet Take 1 tablet by mouth daily.    [provider]  ondansetron (ZOFRAN ODT) 4 MG disintegrating tablet Take 1 tablet (4 mg total) by mouth every 8 (eight) hours as needed. 02/19/21   Glean Salvo, NP  Rimegepant Sulfate (NURTEC) 75 MG TBDP Take 75 mg by mouth as needed (take 1 at onset of headache; max is 1 tablet in 24 hours). 02/23/21   Levert Feinstein, MD  tiZANidine (ZANAFLEX) 4 MG tablet TAKE 1 TABLET BY MOUTH EVERY 6 HOURS AS NEEDED FOR MUSCLE SPASMS 02/19/21   Glean Salvo, NP  zolmitriptan (ZOMIG) 5 MG tablet TAKE 1 TABLET BY MOUTH AT ONSET OF MIGRAINE. MAY REPEAT IN 2 HOURS IF NEEDED. MAX 2 IN 24 HOURS 12/18/20   Glean Salvo, NP    Allergies    Advil [ibuprofen], Salicylates, and Fentanyl  Review of Systems   Review of Systems  All other systems reviewed and are  negative. Ten systems reviewed and are negative for acute change, except as noted in the HPI.   Physical Exam Updated Vital Signs BP (!) 133/93   Pulse 80   Temp 98.4 F (36.9 C)   Resp (!) 22   LMP 05/26/2017 (Exact Date)   SpO2 100%   Physical Exam Vitals and nursing note reviewed.  Constitutional:      General: She is not in  acute distress.    Appearance: Normal appearance. She is well-developed. She is not ill-appearing, toxic-appearing or diaphoretic.  HENT:     Head: Normocephalic and atraumatic.     Right Ear: External ear normal.     Left Ear: External ear normal.     Nose: Nose normal.     Mouth/Throat:     Mouth: Mucous membranes are moist.     Pharynx: Oropharynx is clear. No oropharyngeal exudate or posterior oropharyngeal erythema.  Eyes:     Extraocular Movements: Extraocular movements intact.     Pupils: Pupils are equal, round, and reactive to light.  Cardiovascular:     Rate and Rhythm: Normal rate and regular rhythm.     Pulses: Normal pulses.          Dorsalis pedis pulses are 2+ on the right side and 2+ on the left side.     Heart sounds: Normal heart sounds. Heart sounds not distant. No murmur heard. No systolic murmur is present.  No diastolic murmur is present.    No friction rub. No gallop.  Pulmonary:     Effort: Pulmonary effort is normal. No respiratory distress.     Breath sounds: Normal breath sounds. No stridor. No decreased breath sounds, wheezing, rhonchi or rales.  Chest:     Comments: No chest wall tenderness. Abdominal:     General: Abdomen is flat.     Palpations: Abdomen is soft.     Tenderness: There is no abdominal tenderness.  Musculoskeletal:        General: Normal range of motion.     Cervical back: Normal range of motion and neck supple. No tenderness.     Right lower leg: No tenderness. No edema.     Left lower leg: No tenderness. Edema present.     Comments: Trace pitting edema noted in the left lower extremity.  No edema  noted in the right lower extremity  Skin:    General: Skin is warm and dry.  Neurological:     General: No focal deficit present.     Mental Status: She is alert and oriented to person, place, and time.  Psychiatric:        Mood and Affect: Mood normal.        Behavior: Behavior normal.   ED Results / Procedures / Treatments   Labs (all labs ordered are listed, but only abnormal results are displayed) Labs Reviewed  COMPREHENSIVE METABOLIC PANEL - Abnormal; Notable for the following components:      Result Value   Glucose, Bld 108 (*)    All other components within normal limits  CBC  TROPONIN I (HIGH SENSITIVITY)  TROPONIN I (HIGH SENSITIVITY)   EKG EKG Interpretation  Date/Time:  Sunday June 14 2021 14:20:50 EDT Ventricular Rate:  75 PR Interval:  136 QRS Duration: 90 QT Interval:  410 QTC Calculation: 457 R Axis:   67 Text Interpretation: Normal sinus rhythm Cannot rule out Anterior infarct , age undetermined Abnormal ECG Confirmed by Kristine Royal [phone]) on 06/14/2021 5:18:56 PM  Radiology DG Chest 2 View  Result Date: 06/14/2021 CLINICAL DATA:  Chest pain. EXAM: CHEST - 2 VIEW COMPARISON:  March 30, 2020 FINDINGS: Cardiomediastinal silhouette is normal. Mediastinal contours appear intact. Tortuosity of the aorta. There is no evidence of focal airspace consolidation, pleural effusion or pneumothorax. Osseous structures are without acute abnormality. Soft tissues are grossly normal. IMPRESSION: No active cardiopulmonary disease. Electronically Signed   By: Ulanda Edison.D.  On: 06/14/2021 15:04   CT Angio Chest PE W and/or Wo Contrast  Result Date: 06/14/2021 CLINICAL DATA:  Chest pain, dizziness, generalized weakness, shortness of breath EXAM: CT ANGIOGRAPHY CHEST WITH CONTRAST TECHNIQUE: Multidetector CT imaging of the chest was performed using the standard protocol during bolus administration of intravenous contrast. Multiplanar CT image reconstructions and  MIPs were obtained to evaluate the vascular anatomy. CONTRAST:  30mL OMNIPAQUE IOHEXOL 350 MG/ML SOLN COMPARISON:  06/14/2021, 10/21/2011 FINDINGS: Cardiovascular: This is a technically adequate evaluation of the pulmonary vasculature. No filling defects or pulmonary emboli. Heart is unremarkable without pericardial effusion. Ascending thoracic aortic aneurysm measures approximate 4.1 cm. No evidence of dissection. Mediastinum/Nodes: No enlarged mediastinal, hilar, or axillary lymph nodes. Thyroid gland, trachea, and esophagus demonstrate no significant findings. Lungs/Pleura: No airspace disease, effusion, or pneumothorax. Central airways are widely patent. Upper Abdomen: No acute abnormality. Musculoskeletal: No acute or destructive bony lesions. Reconstructed images demonstrate no additional findings. Review of the MIP images confirms the above findings. IMPRESSION: 1. No evidence of pulmonary embolus. 2. 4.1 cm ascending thoracic aortic aneurysm. No evidence of dissection. Recommend annual imaging followup by CTA or MRA. This recommendation follows 2010 ACCF/AHA/AATS/ACR/ASA/SCA/SCAI/SIR/STS/SVM Guidelines for the Diagnosis and Management of Patients with Thoracic Aortic Disease. Circulation. 2010; 121: S063-K160. Aortic aneurysm NOS (ICD10-I71.9) 3. No acute intrathoracic process. Electronically Signed   By: Sharlet Salina M.D.   On: 06/14/2021 20:19    Procedures Procedures   Medications Ordered in ED Medications  iohexol (OMNIPAQUE) 350 MG/ML injection 80 mL (80 mLs Intravenous Contrast Given 06/14/21 1959)    ED Course  I have reviewed the triage vital signs and the nursing notes.  Pertinent labs & imaging results that were available during my care of the patient were reviewed by me and considered in my medical decision making (see chart for details).  Clinical Course as of 06/14/21 2048  Wynelle Link Jun 14, 2021  2031 CTA incidentally showing a 4.1 cm thoracic aortic aneurysm.  No evidence of  dissection.  Patient discussed with Dr. Dorris Fetch who is on-call for cardiothoracic surgery.  Recommends outpatient follow-up. [LJ]    Clinical Course User Index [LJ] Placido Sou, PA-C   MDM Rules/Calculators/A&P                          Pt is a 50 y.o. female who presents to the emergency department an episode of chest pain and shortness of breath.  Labs: CBC within normal limits. CMP with a glucose of 108. Troponin of 3 with a repeat of 3.  Imaging: Chest x-ray is negative. CTA of the chest with findings showing IMPRESSION: 1. No evidence of pulmonary embolus. 2. 4.1 cm ascending thoracic aortic aneurysm. No evidence of dissection. Recommend annual imaging followup by CTA or MRA. This recommendation follows 2010 [facility]/AHA/AATS/ACR/ASA/SCA/SCAI/SIR/STS/SVM Guidelines for the Diagnosis and Management of Patients with Thoracic Aortic Disease. Circulation.   I, Placido Sou, PA-C, personally reviewed and evaluated these images and lab results as part of my medical decision-making.  Patient symptoms have resolved since coming to the emergency department.  Reassuring troponins, ECG, and chest x-ray.  Doubt ACS at this time.  Given patient's history of antiphospholipid antibody syndrome as well as waxing and waning left lower leg swelling for the past year I obtained a CTA of the chest which was negative for PE but incidentally did note a 4.1 cm thoracic aortic aneurysm.  This was discussed with Dr. Dorris Fetch with cardiothoracic surgery who  recommends outpatient follow-up.  Patient was given his contact information.  Feel that the patient is stable for discharge at this time and she is agreeable.  We discussed return precautions in length.  Recommended that she call her cardiologist tomorrow to schedule an appointment for reevaluation and also reach out to cardiothoracic surgery regarding her diagnosis today.  Her questions were answered and she was amicable at the time of  discharge.  Note: Portions of this report may have been transcribed using voice recognition software. Every effort was made to ensure accuracy; however, inadvertent computerized transcription errors may be present.   Final Clinical Impression(s) / ED Diagnoses Final diagnoses:  Atypical chest pain  Aneurysm of ascending aorta without rupture   Rx / DC Orders ED Discharge Orders     None        Placido Sou, PA-C 06/14/21 2052    Wynetta Fines, MD 06/15/21 2333

## 2021-06-15 ENCOUNTER — Telehealth: Payer: Self-pay | Admitting: Cardiovascular Disease

## 2021-06-15 NOTE — Telephone Encounter (Signed)
Patient states she was seen in the ED and they found an aneurism 4.1 in size. She would like to know if she needs to be seen sooner than 09/15/2021.

## 2021-06-15 NOTE — Telephone Encounter (Signed)
Made patient an office visit after she sees Dr. Dorris Fetch.

## 2021-06-25 NOTE — Progress Notes (Signed)
CARDIOLOGY CONSULT NOTE       Patient ID: Sheryl [name] MRN: [id number] DOB/AGE: December 14, 1970 50 y.o.  Admit date: (Not on file) Referring Physician: Azucena Cecil Primary Physician: Tally Joe, MD Primary Cardiologist: New last seen 2019 Reason for Consultation: Aortic Aneurysm/Dyspnea  Active Problems:   * No active hospital problems. *   HPI:  50 y.o. referred by Dr Azucena Cecil for aortic disease and dyspnea. She was last seen by cardiology in 2019 had an abnormal myovue but cath 04/14/18 showed no significant CAD. CTA done 06/14/21 for chest pain,dizziness dyspnea and weakness showed no PE and 4.1 cm ascending thoracic aortic aneurysm She was set up to see Dr Dorris Fetch CVTS   Seen in ER 06/14/21 with atypical chest pain sharp left sided to arm resolved after 15 minutes ? Associated tunnel vision and near syncope Chronic LE edema with negative [place] for DVT in 2021 She r/o and had no acute ECG changes  She is still working for [employer] from home has son Sheryl [name] at home with her he is 7  BP running a bit higher than usual but in normal range still  ROS All other systems reviewed and negative except as noted above  Past Medical History:  Diagnosis Date   Allergic rhinitis    Anemia    Atypical chest pain 01/07/2012   Ct chest 10/2011:  No acute process, no PE to midsized pulmonary vessels.     Chronic migraine    Migraines    Miscarriage 08/1996, 12/1996   x2   Normal labor 03/19/2014   PONV (postoperative nausea and vomiting)    Postoperative state 03/20/2014   SVD (spontaneous vaginal delivery)    x 2   Vitamin D deficiency     Family History  Problem Relation Age of Onset   Heart attack Father    Hypertension Father    Hyperlipidemia Father    Asthma Son    Heart disease Maternal Grandfather    Cancer Paternal Grandmother        unsure what kind    Social History   Socioeconomic History   Marital status: Married    Spouse name: Not on file   Number of children: 3   Years of  education: 2.5 yrs college   Highest education level: Not on file  Occupational History   Occupation: Production designer, theatre/television/film. Rep    Employer: [employer]  Tobacco Use   Smoking status: Never   Smokeless tobacco: Never  Vaping Use   Vaping Use: Never used  Substance and Sexual Activity   Alcohol use: No   Drug use: No   Sexual activity: Yes    Birth control/protection: Surgical  Other Topics Concern   Not on file  Social History Narrative   Lives at home with husband and three children.   Left-handed.   No caffeine use.       Social Determinants of Health   Financial Resource Strain: Not on file  Food Insecurity: Not on file  Transportation Needs: Not on file  Physical Activity: Not on file  Stress: Not on file  Social Connections: Not on file  Intimate Partner Violence: Not on file    Past Surgical History:  Procedure Laterality Date   ABDOMINAL HYSTERECTOMY     BREAST SURGERY     cyst removed from left breast; benign   CESAREAN SECTION N/A 03/20/2014   Procedure: CESAREAN SECTION;  Surgeon: Loney Laurence, MD;  Location: [facility];  Service: Obstetrics;  Laterality: N/A;   cyst removed from L breast  10/2006   benign   CYSTOSCOPY N/A 06/01/2017   Procedure: CYSTOSCOPY;  Surgeon: Carrington Clamp, MD;  Location: [facility];  Service: Gynecology;  Laterality: N/A;   DILATION AND CURETTAGE OF UTERUS  08/1996, 12/1996   x 2 MAB   DILATION AND CURETTAGE OF UTERUS     KNEE ARTHROSCOPY Right 01/2016   KNEE ARTHROSCOPY Right 08/2016   LEFT HEART CATH AND CORONARY ANGIOGRAPHY N/A 04/14/2018   Procedure: LEFT HEART CATH AND CORONARY ANGIOGRAPHY;  Surgeon: Yvonne Kendall, MD;  Location: [facility];  Service: Cardiovascular;  Laterality: N/A;   live births  10/07/97, 02/08/2000   x2   ROBOTIC ASSISTED TOTAL HYSTERECTOMY WITH SALPINGECTOMY Bilateral 06/01/2017   Procedure: ROBOTIC ASSISTED TOTAL HYSTERECTOMY WITH SALPINGECTOMY WITH PARTIAL REMOVAL OF HERNIA SAC;   Surgeon: Carrington Clamp, MD;  Location: [facility];  Service: Gynecology;  Laterality: Bilateral;   TUBAL LIGATION Bilateral 03/20/2014   Procedure: BILATERAL TUBAL LIGATION;  Surgeon: Loney Laurence, MD;  Location: [facility];  Service: Obstetrics;  Laterality: Bilateral;   UNILATERAL SALPINGECTOMY Right 03/20/2014   Procedure: UNILATERAL SALPINGECTOMY;  Surgeon: Loney Laurence, MD;  Location: [facility];  Service: Obstetrics;  Laterality: Right;      Current Outpatient Medications:    acetaminophen (TYLENOL) 500 MG tablet, Take 500-1,000 mg by mouth every 6 (six) hours as needed (for pain/headaches.)., Disp: , Rfl:    albuterol (VENTOLIN HFA) 108 (90 Base) MCG/ACT inhaler, 2 puffs, Disp: , Rfl:    atenolol (TENORMIN) 25 MG tablet, Take 1 tablet (25 mg total) by mouth daily., Disp: 90 tablet, Rfl: 4   budesonide-formoterol (SYMBICORT) 80-4.5 MCG/ACT inhaler, Symbicort 80 mcg-4.5 mcg/actuation HFA aerosol inhaler  Inhale 2 puffs twice a day by inhalation route., Disp: , Rfl:    cetirizine (ZYRTEC) 10 MG tablet, Take 10 mg by mouth daily as needed for allergies ((hay fever/pollen)). , Disp: , Rfl:    Multiple Vitamin (MULTIVITAMIN WITH MINERALS) TABS tablet, Take 1 tablet by mouth daily., Disp: , Rfl:    ondansetron (ZOFRAN ODT) 4 MG disintegrating tablet, Take 1 tablet (4 mg total) by mouth every 8 (eight) hours as needed., Disp: 20 tablet, Rfl: 11   tiZANidine (ZANAFLEX) 4 MG tablet, TAKE 1 TABLET BY MOUTH EVERY 6 HOURS AS NEEDED FOR MUSCLE SPASMS, Disp: 30 tablet, Rfl: 6   zolmitriptan (ZOMIG) 5 MG tablet, TAKE 1 TABLET BY MOUTH AT ONSET OF MIGRAINE. MAY REPEAT IN 2 HOURS IF NEEDED. MAX 2 IN 24 HOURS, Disp: 12 tablet, Rfl: 11   Rimegepant Sulfate (NURTEC) 75 MG TBDP, Take 75 mg by mouth as needed (take 1 at onset of headache; max is 1 tablet in 24 hours)., Disp: 8 tablet, Rfl: 11    Physical Exam: Blood pressure 134/74, pulse 64, height 5\' 7"  (1.702 m), weight 284 lb (128.8 kg), last menstrual  period 05/26/2017, SpO2 98 %, unknown if currently breastfeeding.    Affect appropriate Healthy:  appears stated age HEENT: normal Neck supple with no adenopathy JVP normal no bruits no thyromegaly Lungs clear with no wheezing and good diaphragmatic motion Heart:  S1/S2 no murmur, no rub, gallop or click PMI normal Abdomen: benighn, BS positve, no tenderness, no AAA no bruit.  No HSM or HJR Distal pulses intact with no bruits No edema Neuro non-focal Skin warm and dry No muscular weakness   Labs:   Lab Results  Component Value Date   WBC 8.1 06/14/2021  HGB 14.3 06/14/2021   HCT 43.3 06/14/2021   MCV 90.6 06/14/2021   PLT 363 06/14/2021   No results for input(s): NA, K, CL, CO2, BUN, CREATININE, CALCIUM, PROT, BILITOT, ALKPHOS, ALT, AST, GLUCOSE in the last 168 hours.  Invalid input(s): LABALBU Lab Results  Component Value Date   CKTOTAL 61 06/03/2010   CKMB 0.7 06/03/2010   TROPONINI 0.01        NO INDICATION OF MYOCARDIAL INJURY. 06/03/2010    Lab Results  Component Value Date   CHOL 168 04/26/2018   CHOL  06/03/2010    135        ATP III CLASSIFICATION:  <200     mg/dL   Desirable  161-096  mg/dL   Borderline High  >=045    mg/dL   High          Lab Results  Component Value Date   HDL 40 04/26/2018   HDL 47 06/03/2010   Lab Results  Component Value Date   LDLCALC 87 04/26/2018   LDLCALC  06/03/2010    49        Total Cholesterol/HDL:CHD Risk Coronary Heart Disease Risk Table                     Men   Women  1/2 Average Risk   3.4   3.3  Average Risk       5.0   4.4  2 X Average Risk   9.6   7.1  3 X Average Risk  23.4   11.0        Use the calculated Patient Ratio above and the CHD Risk Table to determine the patient's CHD Risk.        ATP III CLASSIFICATION (LDL):  <100     mg/dL   Optimal  409-811  mg/dL   Near or Above                    Optimal  130-159  mg/dL   Borderline  914-782  mg/dL   High  >956     mg/dL   Very High   Lab  Results  Component Value Date   TRIG 205 (H) 04/26/2018   TRIG 194 (H) 06/03/2010   Lab Results  Component Value Date   CHOLHDL 4.2 04/26/2018   CHOLHDL 2.9 06/03/2010   No results found for: LDLDIRECT    Radiology: DG Chest 2 View  Result Date: 06/14/2021 CLINICAL DATA:  Chest pain. EXAM: CHEST - 2 VIEW COMPARISON:  March 30, 2020 FINDINGS: Cardiomediastinal silhouette is normal. Mediastinal contours appear intact. Tortuosity of the aorta. There is no evidence of focal airspace consolidation, pleural effusion or pneumothorax. Osseous structures are without acute abnormality. Soft tissues are grossly normal. IMPRESSION: No active cardiopulmonary disease. Electronically Signed   By: Ted Mcalpine M.D.   On: 06/14/2021 15:04   CT Angio Chest PE W and/or Wo Contrast  Result Date: 06/14/2021 CLINICAL DATA:  Chest pain, dizziness, generalized weakness, shortness of breath EXAM: CT ANGIOGRAPHY CHEST WITH CONTRAST TECHNIQUE: Multidetector CT imaging of the chest was performed using the standard protocol during bolus administration of intravenous contrast. Multiplanar CT image reconstructions and MIPs were obtained to evaluate the vascular anatomy. CONTRAST:  20mL OMNIPAQUE IOHEXOL 350 MG/ML SOLN COMPARISON:  06/14/2021, 10/21/2011 FINDINGS: Cardiovascular: This is a technically adequate evaluation of the pulmonary vasculature. No filling defects or pulmonary emboli. Heart is unremarkable without pericardial effusion. Ascending thoracic aortic aneurysm measures  approximate 4.1 cm. No evidence of dissection. Mediastinum/Nodes: No enlarged mediastinal, hilar, or axillary lymph nodes. Thyroid gland, trachea, and esophagus demonstrate no significant findings. Lungs/Pleura: No airspace disease, effusion, or pneumothorax. Central airways are widely patent. Upper Abdomen: No acute abnormality. Musculoskeletal: No acute or destructive bony lesions. Reconstructed images demonstrate no additional findings.  Review of the MIP images confirms the above findings. IMPRESSION: 1. No evidence of pulmonary embolus. 2. 4.1 cm ascending thoracic aortic aneurysm. No evidence of dissection. Recommend annual imaging followup by CTA or MRA. This recommendation follows 2010 ACCF/AHA/AATS/ACR/ASA/SCA/SCAI/SIR/STS/SVM Guidelines for the Diagnosis and Management of Patients with Thoracic Aortic Disease. Circulation. 2010; 121: J500-X381. Aortic aneurysm NOS (ICD10-I71.9) 3. No acute intrathoracic process. Electronically Signed   By: Sharlet Salina M.D.   On: 06/14/2021 20:19    EKG: NSR poor R wave progression normal ST;s 06/15/21   ASSESSMENT AND PLAN:   Chest Pain: atypical r/ in ER 06/15/21 previous normal cath in 2019 Do not think acute ischemic testing needed. Legrand Rams doing f/u CTA for her aortic aneurysm as gated cardiac scan ? In 6 months  Dsypnea:  likely functional check echo  Aortic Aneurysm :  F/U [place]on see above regarding CTA continue atenolol   F/U in 6 months will order cardiac CTA and BMET then   Signed: Charlton Haws 07/08/2021, 8:11 AM

## 2021-07-07 ENCOUNTER — Encounter: Payer: 59 | Admitting: Thoracic Surgery (Cardiothoracic Vascular Surgery)

## 2021-07-08 ENCOUNTER — Other Ambulatory Visit: Payer: Self-pay

## 2021-07-08 ENCOUNTER — Encounter: Payer: Self-pay | Admitting: Cardiovascular Disease

## 2021-07-08 ENCOUNTER — Ambulatory Visit (INDEPENDENT_AMBULATORY_CARE_PROVIDER_SITE_OTHER): Payer: 59 | Admitting: Cardiovascular Disease

## 2021-07-08 VITALS — BP 134/74 | HR 64 | Ht 67.0 in | Wt 284.0 lb

## 2021-07-08 DIAGNOSIS — R079 Chest pain, unspecified: Secondary | ICD-10-CM

## 2021-07-08 DIAGNOSIS — I7121 Aneurysm of the ascending aorta, without rupture: Secondary | ICD-10-CM | POA: Diagnosis not present

## 2021-07-08 NOTE — Patient Instructions (Signed)
Medication Instructions:  *If you need a refill on your cardiac medications before your next appointment, please call your pharmacy*   Lab Work: If you have labs (blood work) drawn today and your tests are completely normal, you will receive your results only by: MyChart Message (if you have MyChart) OR A paper copy in the mail If you have any lab test that is abnormal or we need to change your treatment, we will call you to review the results.   Testing/Procedures: None ordered today.   Follow-Up: At [facility], you and your health needs are our priority.  As part of our continuing mission to provide you with exceptional heart care, we have created designated Provider Care Teams.  These Care Teams include your primary Cardiologist (physician) and Advanced Practice Providers (APPs -  Physician Assistants and Nurse Practitioners) who all work together to provide you with the care you need, when you need it.  We recommend signing up for the patient portal called "MyChart".  Sign up information is provided on this After Visit Summary.  MyChart is used to connect with patients for Virtual Visits (Telemedicine).  Patients are able to view lab/test results, encounter notes, upcoming appointments, etc.  Non-urgent messages can be sent to your provider as well.   To learn more about what you can do with MyChart, go to [email or web address].    Your next appointment:   6 month(s)  The format for your next appointment:   In Person  Provider:   You may see Dr. Nishan or one of the following Advanced Practice Providers on your designated Care Team:   Laura Ingold, NP  

## 2021-07-28 ENCOUNTER — Institutional Professional Consult (permissible substitution) (INDEPENDENT_AMBULATORY_CARE_PROVIDER_SITE_OTHER): Payer: 59 | Admitting: Thoracic Surgery (Cardiothoracic Vascular Surgery)

## 2021-07-28 ENCOUNTER — Other Ambulatory Visit: Payer: Self-pay

## 2021-07-28 ENCOUNTER — Encounter: Payer: Self-pay | Admitting: Thoracic Surgery (Cardiothoracic Vascular Surgery)

## 2021-07-28 DIAGNOSIS — I712 Thoracic aortic aneurysm, without rupture, unspecified: Secondary | ICD-10-CM | POA: Diagnosis not present

## 2021-07-28 NOTE — Progress Notes (Signed)
PCP is Tally Joe, MD Referring Provider is Lorre Nick, MD  Chief Complaint  Patient presents with   Thoracic Aortic Aneurysm    New patient consultation with chest CT    HPI: Mrs. Burkemper is sent for consultation regarding an ascending aneurysm.  CT ANGIOGRAPHY CHEST WITH CONTRAST   TECHNIQUE: Multidetector CT imaging of the chest was performed using the standard protocol during bolus administration of intravenous contrast. Multiplanar CT image reconstructions and MIPs were obtained to evaluate the vascular anatomy.   CONTRAST:  37mL OMNIPAQUE IOHEXOL 350 MG/ML SOLN   COMPARISON:  06/14/2021, 10/21/2011   FINDINGS: Cardiovascular: This is a technically adequate evaluation of the pulmonary vasculature. No filling defects or pulmonary emboli.   Heart is unremarkable without pericardial effusion. Ascending thoracic aortic aneurysm measures approximate 4.1 cm. No evidence of dissection.   Mediastinum/Nodes: No enlarged mediastinal, hilar, or axillary lymph nodes. Thyroid gland, trachea, and esophagus demonstrate no significant findings.   Lungs/Pleura: No airspace disease, effusion, or pneumothorax. Central airways are widely patent.   Upper Abdomen: No acute abnormality.   Musculoskeletal: No acute or destructive bony lesions. Reconstructed images demonstrate no additional findings.   Review of the MIP images confirms the above findings.   IMPRESSION: 1. No evidence of pulmonary embolus. 2. 4.1 cm ascending thoracic aortic aneurysm. No evidence of dissection. Recommend annual imaging followup by CTA or MRA. This recommendation follows 2010 ACCF/AHA/AATS/ACR/ASA/SCA/SCAI/SIR/STS/SVM Guidelines for the Diagnosis and Management of Patients with Thoracic Aortic Disease. Circulation. 2010; 121: [id number]. Aortic aneurysm NOS (ICD10-I71.9) 3. No acute intrathoracic process.     Electronically Signed   By: Sharlet Salina M.D.   On: 06/14/2021 20:19   She had an  episode of atypical chest pain several years ago.  She had a cardiac catheterization which was negative.  She also was worked up for PE which was negative at that time as well.  About 6 weeks ago she had a severe episode of chest pain.  She was at the grocery store.  She experienced left-sided chest pain with radiation to the left arm.  She experienced tunnel vision and felt like she was going to pass out.  She ruled out for MI.  She had a CT of the chest which showed reported 4.1 cm ascending aneurysm.  She has not had any further pain of that nature since.  She saw Dr. Loleta Dicker.  He did not feel it was a cardiac source.   Past Medical History:  Diagnosis Date   Allergic rhinitis    Anemia    Atypical chest pain 01/07/2012   Ct chest 10/2011:  No acute process, no PE to midsized pulmonary vessels.     Chronic migraine    Migraines    Miscarriage 08/1996, 12/1996   x2   Normal labor 03/19/2014   PONV (postoperative nausea and vomiting)    Postoperative state 03/20/2014   SVD (spontaneous vaginal delivery)    x 2   Vitamin D deficiency     Past Surgical History:  Procedure Laterality Date   ABDOMINAL HYSTERECTOMY     BREAST SURGERY     cyst removed from left breast; benign   CESAREAN SECTION N/A 03/20/2014   Procedure: CESAREAN SECTION;  Surgeon: Loney Laurence, MD;  Location: [facility];  Service: Obstetrics;  Laterality: N/A;   cyst removed from L breast  10/2006   benign   CYSTOSCOPY N/A 06/01/2017   Procedure: CYSTOSCOPY;  Surgeon: Carrington Clamp, MD;  Location: [facility];  Service: Gynecology;  Laterality: N/A;   DILATION AND CURETTAGE OF UTERUS  08/1996, 12/1996   x 2 MAB   DILATION AND CURETTAGE OF UTERUS     KNEE ARTHROSCOPY Right 01/2016   KNEE ARTHROSCOPY Right 08/2016   LEFT HEART CATH AND CORONARY ANGIOGRAPHY N/A 04/14/2018   Procedure: LEFT HEART CATH AND CORONARY ANGIOGRAPHY;  Surgeon: Yvonne Kendall, MD;  Location: [facility];  Service: Cardiovascular;  Laterality:  N/A;   live births  10/07/97, 02/08/2000   x2   ROBOTIC ASSISTED TOTAL HYSTERECTOMY WITH SALPINGECTOMY Bilateral 06/01/2017   Procedure: ROBOTIC ASSISTED TOTAL HYSTERECTOMY WITH SALPINGECTOMY WITH PARTIAL REMOVAL OF HERNIA SAC;  Surgeon: Carrington Clamp, MD;  Location: [facility];  Service: Gynecology;  Laterality: Bilateral;   TUBAL LIGATION Bilateral 03/20/2014   Procedure: BILATERAL TUBAL LIGATION;  Surgeon: Loney Laurence, MD;  Location: [facility];  Service: Obstetrics;  Laterality: Bilateral;   UNILATERAL SALPINGECTOMY Right 03/20/2014   Procedure: UNILATERAL SALPINGECTOMY;  Surgeon: Loney Laurence, MD;  Location: [facility];  Service: Obstetrics;  Laterality: Right;    Family History  Problem Relation Age of Onset   Heart attack Father    Hypertension Father    Hyperlipidemia Father    Asthma Son    Heart disease Maternal Grandfather    Cancer Paternal Grandmother        unsure what kind    Social History Social History   Tobacco Use   Smoking status: Never   Smokeless tobacco: Never  Vaping Use   Vaping Use: Never used  Substance Use Topics   Alcohol use: No   Drug use: No    Current Outpatient Medications  Medication Sig Dispense Refill   acetaminophen (TYLENOL) 500 MG tablet Take 500-1,000 mg by mouth every 6 (six) hours as needed (for pain/headaches.).     albuterol (VENTOLIN HFA) 108 (90 Base) MCG/ACT inhaler 2 puffs     atenolol (TENORMIN) 25 MG tablet Take 1 tablet (25 mg total) by mouth daily. 90 tablet 4   budesonide-formoterol (SYMBICORT) 80-4.5 MCG/ACT inhaler Symbicort 80 mcg-4.5 mcg/actuation HFA aerosol inhaler  Inhale 2 puffs twice a day by inhalation route.     cetirizine (ZYRTEC) 10 MG tablet Take 10 mg by mouth daily as needed for allergies ((hay fever/pollen)).      Multiple Vitamin (MULTIVITAMIN WITH MINERALS) TABS tablet Take 1 tablet by mouth daily.     ondansetron (ZOFRAN ODT) 4 MG disintegrating tablet Take 1 tablet (4 mg total) by mouth every 8  (eight) hours as needed. 20 tablet 11   tiZANidine (ZANAFLEX) 4 MG tablet TAKE 1 TABLET BY MOUTH EVERY 6 HOURS AS NEEDED FOR MUSCLE SPASMS 30 tablet 6   zolmitriptan (ZOMIG) 5 MG tablet TAKE 1 TABLET BY MOUTH AT ONSET OF MIGRAINE. MAY REPEAT IN 2 HOURS IF NEEDED. MAX 2 IN 24 HOURS 12 tablet 11   No current facility-administered medications for this visit.    Allergies  Allergen Reactions   Advil [Ibuprofen] Anaphylaxis and Hives   Salicylates Anaphylaxis and Hives    Unknown reaction Sulfur meds   Codeine Other (See Comments)   Fentanyl Nausea And Vomiting    Migraine, vomiting    Review of Systems  Constitutional:  Positive for unexpected weight change.  Respiratory:  Positive for shortness of breath.   Cardiovascular:  Positive for chest pain and leg swelling (Left).  Musculoskeletal:  Positive for arthralgias.  Neurological:  Positive for dizziness and headaches.  Psychiatric/Behavioral:  Under a great deal of stress   BP 107/74 (BP Location: Left Arm, Patient Position: Sitting, Cuff Size: Large)   Pulse 68   Resp 20   Ht 5\' 7"  (1.702 m)   Wt 284 lb (128.8 kg)   LMP 05/26/2017 (Exact Date)   SpO2 95% Comment: RA  BMI 44.48 kg/m  Physical Exam Vitals reviewed.  Constitutional:      General: She is not in acute distress.    Appearance: She is obese.  HENT:     Head: Normocephalic and atraumatic.  Eyes:     General: No scleral icterus.    Extraocular Movements: Extraocular movements intact.  Neck:     Vascular: No carotid bruit.  Cardiovascular:     Rate and Rhythm: Normal rate and regular rhythm.     Heart sounds: Normal heart sounds. No murmur heard.   No friction rub. No gallop.  Pulmonary:     Effort: No respiratory distress.     Breath sounds: Normal breath sounds. No wheezing.  Abdominal:     General: There is no distension.     Palpations: Abdomen is soft.  Musculoskeletal:     Cervical back: Neck supple.  Skin:    General: Skin is warm and  dry.  Neurological:     General: No focal deficit present.     Mental Status: She is alert and oriented to person, place, and time.     Cranial Nerves: No cranial nerve deficit.     Motor: No weakness.     Diagnostic Tests: CT ANGIOGRAPHY CHEST WITH CONTRAST   TECHNIQUE: Multidetector CT imaging of the chest was performed using the standard protocol during bolus administration of intravenous contrast. Multiplanar CT image reconstructions and MIPs were obtained to evaluate the vascular anatomy.   CONTRAST:  87mL OMNIPAQUE IOHEXOL 350 MG/ML SOLN   COMPARISON:  06/14/2021, 10/21/2011   FINDINGS: Cardiovascular: This is a technically adequate evaluation of the pulmonary vasculature. No filling defects or pulmonary emboli.   Heart is unremarkable without pericardial effusion. Ascending thoracic aortic aneurysm measures approximate 4.1 cm. No evidence of dissection.   Mediastinum/Nodes: No enlarged mediastinal, hilar, or axillary lymph nodes. Thyroid gland, trachea, and esophagus demonstrate no significant findings.   Lungs/Pleura: No airspace disease, effusion, or pneumothorax. Central airways are widely patent.   Upper Abdomen: No acute abnormality.   Musculoskeletal: No acute or destructive bony lesions. Reconstructed images demonstrate no additional findings.   Review of the MIP images confirms the above findings.   IMPRESSION: 1. No evidence of pulmonary embolus. 2. 4.1 cm ascending thoracic aortic aneurysm. No evidence of dissection. Recommend annual imaging followup by CTA or MRA. This recommendation follows 2010 ACCF/AHA/AATS/ACR/ASA/SCA/SCAI/SIR/STS/SVM Guidelines for the Diagnosis and Management of Patients with Thoracic Aortic Disease. Circulation. 2010; 1212011. Aortic aneurysm NOS (ICD10-I71.9) 3. No acute intrathoracic process.     Electronically Signed   By: : [id number] M.D.   On: 06/14/2021 20:19   I personally reviewed the CT images.   The ascending aorta is a little enlarged.  There is a great deal of curvature so I think the 4.1 cm is a bit of an overestimate.  Personal measurement was 3.8 cm.  Impression: Kamaile Zachow is a 50 year old woman with past medical history significant for migraines, atypical chest pain, and obesity.  She recently had a severe episode of atypical chest pain.  It resolved spontaneously in about 15 minutes.  As part of her work-up she had a CT of the chest.  There was a reported 4.1 cm ascending aneurysm.  Ascending aneurysm-there is a great deal of curvature and I think 4.1 cm is an overestimation.  I think is actually a little smaller than that.  In any event, there is no indication for surgical intervention.  She needs annual follow-up.  Importance of blood pressure control was emphasized.  Atypical chest pain-not felt to be of cardiac origin.  Could be a panic attack given the extreme pressure she has been under recently.  Coronary spasm or esophageal spasm also could be potential sources. Plan: Return in 1 year with cardiac gated CT angio to follow-up ascending aneurysm.  Loreli Slot, MD [facility] [phone]

## 2021-08-13 ENCOUNTER — Other Ambulatory Visit: Payer: Self-pay

## 2021-08-13 ENCOUNTER — Emergency Department (HOSPITAL_COMMUNITY): Payer: 59

## 2021-08-13 ENCOUNTER — Emergency Department (HOSPITAL_COMMUNITY)
Admission: EM | Admit: 2021-08-13 | Discharge: 2021-08-14 | Disposition: A | Discharge status: Home / Self Care | Payer: 59 | Attending: Emergency Medicine | Admitting: Emergency Medicine

## 2021-08-13 ENCOUNTER — Encounter (HOSPITAL_COMMUNITY): Payer: Self-pay

## 2021-08-13 DIAGNOSIS — R1032 Left lower quadrant pain: Secondary | ICD-10-CM | POA: Diagnosis present

## 2021-08-13 DIAGNOSIS — Z79899 Other long term (current) drug therapy: Secondary | ICD-10-CM | POA: Insufficient documentation

## 2021-08-13 DIAGNOSIS — R1030 Lower abdominal pain, unspecified: Secondary | ICD-10-CM

## 2021-08-13 DIAGNOSIS — K5730 Diverticulosis of large intestine without perforation or abscess without bleeding: Secondary | ICD-10-CM | POA: Diagnosis not present

## 2021-08-13 DIAGNOSIS — E669 Obesity, unspecified: Secondary | ICD-10-CM | POA: Insufficient documentation

## 2021-08-13 LAB — CBC WITH DIFFERENTIAL/PLATELET
Abs Immature Granulocytes: 0.02 10*3/uL (ref 0.00–0.07)
Basophils Absolute: 0.1 10*3/uL (ref 0.0–0.1)
Basophils Relative: 1 %
Eosinophils Absolute: 0.3 10*3/uL (ref 0.0–0.5)
Eosinophils Relative: 3 %
HCT: 42.6 % (ref 36.0–46.0)
Hemoglobin: 14.1 g/dL (ref 12.0–15.0)
Immature Granulocytes: 0 %
Lymphocytes Relative: 21 %
Lymphs Abs: 1.8 10*3/uL (ref 0.7–4.0)
MCH: 29.9 pg (ref 26.0–34.0)
MCHC: 33.1 g/dL (ref 30.0–36.0)
MCV: 90.3 fL (ref 80.0–100.0)
Monocytes Absolute: 0.8 10*3/uL (ref 0.1–1.0)
Monocytes Relative: 9 %
Neutro Abs: 5.6 10*3/uL (ref 1.7–7.7)
Neutrophils Relative %: 66 %
Platelets: 382 10*3/uL (ref 150–400)
RBC: 4.72 MIL/uL (ref 3.87–5.11)
RDW: 12.4 % (ref 11.5–15.5)
WBC: 8.6 10*3/uL (ref 4.0–10.5)
nRBC: 0 % (ref 0.0–0.2)

## 2021-08-13 LAB — COMPREHENSIVE METABOLIC PANEL
ALT: 27 U/L (ref 0–44)
AST: 43 U/L — ABNORMAL HIGH (ref 15–41)
Albumin: 3.7 g/dL (ref 3.5–5.0)
Alkaline Phosphatase: 55 U/L (ref 38–126)
Anion gap: 9 (ref 5–15)
BUN: 6 mg/dL (ref 6–20)
CO2: 30 mmol/L (ref 22–32)
Calcium: 9.4 mg/dL (ref 8.9–10.3)
Chloride: 100 mmol/L (ref 98–111)
Creatinine, Ser: 0.7 mg/dL (ref 0.44–1.00)
GFR, Estimated: >60 mL/min (ref >60)
Glucose, Bld: 94 mg/dL (ref 70–99)
Potassium: 5 mmol/L (ref 3.5–5.1)
Sodium: 139 mmol/L (ref 135–145)
Total Bilirubin: 1 mg/dL (ref 0.3–1.2)
Total Protein: 6.3 g/dL — ABNORMAL LOW (ref 6.5–8.1)

## 2021-08-13 LAB — LIPASE, BLOOD: Lipase: 39 U/L (ref 11–51)

## 2021-08-13 MED ORDER — IOHEXOL 300 MG/ML  SOLN
100.0000 mL | Freq: Once | INTRAMUSCULAR | Status: AC | PRN
  Administered 2021-08-13: 100 mL via INTRAVENOUS

## 2021-08-13 MED ORDER — HYDROCODONE-ACETAMINOPHEN 5-325 MG PO TABS
1.0000 | ORAL_TABLET | Freq: Once | ORAL | Status: AC
  Administered 2021-08-13: 1 via ORAL
  Filled 2021-08-13: qty 1

## 2021-08-13 NOTE — ED Triage Notes (Signed)
Pt here for abdominal pain that's been ongoing for the past 2 weeks. Patient states that she's been taking 2 different abx for 12/1. Patient states that the pain is in lower abdomen, has no fever but has back pain and chills.

## 2021-08-13 NOTE — ED Provider Notes (Addendum)
Emergency Medicine Provider Triage Evaluation Note  Sheryl [name] , a 50 y.o. female  was evaluated in triage.  Pt complains of right lower quadrant and suprapubic abdominal pain has been going on for last 2 weeks.  Seen evaluated urgent care thought to be UTI and placed on Keflex.  Urine culture ended up being negative so she stopped that.  Was seen at another urgent care thought to be an abdominal infection and was given Cipro and Flagyl.  Has been taking now for last 7 days without any improvement.  Pain got severe today prompting her arrival to the emergency department today.  Been having diarrhea since taking Cipro and Flagyl.  No nausea no vomiting no fever.  Associated chills. No urinary symptoms.  Review of Systems  Positive:  Negative: See above   Physical Exam  BP (!) 120/97 (BP Location: Left Arm)   Pulse 75   Temp 98.6 F (37 C) (Oral)   Resp 18   LMP 05/26/2017 (Exact Date)   SpO2 100%  Gen:   Awake, no distress   Resp:  Normal effort  MSK:   Moves extremities without difficulty  Other:  Suprapubic and right lower quadrant tenderness.  Medical Decision Making  Medically screening exam initiated at 5:41 PM.  Appropriate orders placed.  THI KLICH was informed that the remainder of the evaluation will be completed by another provider, this initial triage assessment does not replace that evaluation, and the importance of remaining in the ED until their evaluation is complete.     Teressa Lower, PA-C 08/13/21 1742    Honor Loh M, PA-C 08/13/21 1743    Linwood Dibbles, MD 08/14/21 [phone]

## 2021-08-13 NOTE — ED Provider Notes (Addendum)
[facility] EMERGENCY DEPARTMENT Provider Note   CSN: [id number] Arrival date & time: 08/13/21  1529     History Chief Complaint  Patient presents with   Abdominal Pain    Sheryl CaffeyLois H Zody is a 50 y.o. female.  The history is provided by the patient and medical records.  Abdominal Pain Pain location:  LLQ and RLQ Pain quality: aching   Pain radiates to:  Does not radiate Pain severity:  Moderate Onset quality:  Gradual Duration:  2 weeks Timing:  Constant Progression:  Waxing and waning Chronicity:  New Context comment:  Treated for UTI with Keflex, treated for presumed intra-abdominal infection with Cipro and Flagyl for 1 week Relieved by:  Nothing Worsened by:  Nothing Ineffective treatments: Outpatient antibiotics. Associated symptoms: nausea   Associated symptoms: no chest pain, no chills, no cough, no diarrhea, no dysuria, no fever, no hematuria, no shortness of breath, no sore throat and no vomiting   Risk factors comment:  History of partial hysterectomy     Past Medical History:  Diagnosis Date   Allergic rhinitis    Anemia    Atypical chest pain 01/07/2012   Ct chest 10/2011:  No acute process, no PE to midsized pulmonary vessels.     Chronic migraine    Migraines    Miscarriage 08/1996, 12/1996   x2   Normal labor 03/19/2014   PONV (postoperative nausea and vomiting)    Postoperative state 03/20/2014   SVD (spontaneous vaginal delivery)    x 2   Vitamin D deficiency     Patient Active Problem List   Diagnosis Date Noted   Thoracic aortic aneurysm without rupture 07/28/2021   Chronic migraine w/o aura w/o status migrainosus, not intractable 06/15/2017   Postoperative state 03/20/2014   Normal labor 03/19/2014   Atypical chest pain 01/07/2012    Past Surgical History:  Procedure Laterality Date   ABDOMINAL HYSTERECTOMY     BREAST SURGERY     cyst removed from left breast; benign   CESAREAN SECTION N/A 03/20/2014   Procedure:  CESAREAN SECTION;  Surgeon: Loney LaurenceMichelle A Horvath, MD;  Location: [facility];  Service: Obstetrics;  Laterality: N/A;   cyst removed from L breast  10/2006   benign   CYSTOSCOPY N/A 06/01/2017   Procedure: CYSTOSCOPY;  Surgeon: Carrington ClampHorvath, Michelle, MD;  Location: [facility];  Service: Gynecology;  Laterality: N/A;   DILATION AND CURETTAGE OF UTERUS  08/1996, 12/1996   x 2 MAB   DILATION AND CURETTAGE OF UTERUS     KNEE ARTHROSCOPY Right 01/2016   KNEE ARTHROSCOPY Right 08/2016   LEFT HEART CATH AND CORONARY ANGIOGRAPHY N/A 04/14/2018   Procedure: LEFT HEART CATH AND CORONARY ANGIOGRAPHY;  Surgeon: Yvonne KendallEnd, Christopher, MD;  Location: [facility];  Service: Cardiovascular;  Laterality: N/A;   live births  10/07/97, 02/08/2000   x2   ROBOTIC ASSISTED TOTAL HYSTERECTOMY WITH SALPINGECTOMY Bilateral 06/01/2017   Procedure: ROBOTIC ASSISTED TOTAL HYSTERECTOMY WITH SALPINGECTOMY WITH PARTIAL REMOVAL OF HERNIA SAC;  Surgeon: Carrington ClampHorvath, Michelle, MD;  Location: [facility];  Service: Gynecology;  Laterality: Bilateral;   TUBAL LIGATION Bilateral 03/20/2014   Procedure: BILATERAL TUBAL LIGATION;  Surgeon: Loney LaurenceMichelle A Horvath, MD;  Location: [facility];  Service: Obstetrics;  Laterality: Bilateral;   UNILATERAL SALPINGECTOMY Right 03/20/2014   Procedure: UNILATERAL SALPINGECTOMY;  Surgeon: Loney LaurenceMichelle A Horvath, MD;  Location: [facility];  Service: Obstetrics;  Laterality: Right;     OB History     Gravida  5  Para  3   Term  1   Preterm  2   AB  2   Living  3      SAB  2   IAB  0   Ectopic  0   Multiple      Live Births  1           Family History  Problem Relation Age of Onset   Heart attack Father    Hypertension Father    Hyperlipidemia Father    Asthma Son    Heart disease Maternal Grandfather    Cancer Paternal Grandmother        unsure what kind    Social History   Tobacco Use   Smoking status: Never   Smokeless tobacco: Never  Vaping Use   Vaping Use: Never used  Substance Use Topics    Alcohol use: No   Drug use: No    Home Medications Prior to Admission medications   Medication Sig Start Date End Date Taking? Authorizing Provider  acetaminophen (TYLENOL) 500 MG tablet Take 500-1,000 mg by mouth every 6 (six) hours as needed (for pain/headaches.).    [provider]  albuterol (VENTOLIN HFA) 108 (90 Base) MCG/ACT inhaler 2 puffs 05/21/20   [provider]  atenolol (TENORMIN) 25 MG tablet Take 1 tablet (25 mg total) by mouth daily. 02/19/21   Suzzanne Cloud, NP  budesonide-formoterol (SYMBICORT) 80-4.5 MCG/ACT inhaler Symbicort 80 mcg-4.5 mcg/actuation HFA aerosol inhaler  Inhale 2 puffs twice a day by inhalation route.    [provider]  cetirizine (ZYRTEC) 10 MG tablet Take 10 mg by mouth daily as needed for allergies ((hay fever/pollen)).     [provider]  Multiple Vitamin (MULTIVITAMIN WITH MINERALS) TABS tablet Take 1 tablet by mouth daily.    [provider]  ondansetron (ZOFRAN ODT) 4 MG disintegrating tablet Take 1 tablet (4 mg total) by mouth every 8 (eight) hours as needed. 02/19/21   Suzzanne Cloud, NP  tiZANidine (ZANAFLEX) 4 MG tablet TAKE 1 TABLET BY MOUTH EVERY 6 HOURS AS NEEDED FOR MUSCLE SPASMS 02/19/21   Suzzanne Cloud, NP  zolmitriptan (ZOMIG) 5 MG tablet TAKE 1 TABLET BY MOUTH AT ONSET OF MIGRAINE. MAY REPEAT IN 2 HOURS IF NEEDED. MAX 2 IN 24 HOURS 12/18/20   Suzzanne Cloud, NP    Allergies    Advil [ibuprofen], Salicylates, Codeine, and Fentanyl  Review of Systems   Review of Systems  Constitutional:  Negative for chills and fever.  HENT:  Negative for ear pain and sore throat.   Eyes:  Negative for pain and visual disturbance.  Respiratory:  Negative for cough and shortness of breath.   Cardiovascular:  Negative for chest pain and palpitations.  Gastrointestinal:  Positive for abdominal pain and nausea. Negative for abdominal distention, blood in stool, diarrhea and vomiting.  Genitourinary:  Negative  for dysuria and hematuria.  Musculoskeletal:  Negative for arthralgias and back pain.  Skin:  Negative for color change and rash.  Neurological:  Negative for seizures and syncope.  All other systems reviewed and are negative.  Physical Exam Updated Vital Signs BP (!) 123/94   Pulse 64   Temp 98.6 F (37 C) (Oral)   Resp (!) 22   LMP 05/26/2017 (Exact Date)   SpO2 98%   Physical Exam Vitals and nursing note reviewed.  Constitutional:      General: She is not in acute distress.    Appearance: Normal  appearance. She is well-developed. She is obese. She is not toxic-appearing.  HENT:     Head: Normocephalic and atraumatic.     Right Ear: External ear normal.     Left Ear: External ear normal.     Nose: Nose normal. No congestion or rhinorrhea.     Mouth/Throat:     Mouth: Mucous membranes are moist.  Eyes:     Extraocular Movements: Extraocular movements intact.     Conjunctiva/sclera: Conjunctivae normal.     Pupils: Pupils are equal, round, and reactive to light.  Cardiovascular:     Rate and Rhythm: Normal rate and regular rhythm.     Pulses: Normal pulses.     Heart sounds: No murmur heard. Pulmonary:     Effort: Pulmonary effort is normal. No respiratory distress.     Breath sounds: Normal breath sounds. No wheezing, rhonchi or rales.  Abdominal:     General: Abdomen is flat. Bowel sounds are normal.     Palpations: Abdomen is soft.     Tenderness: There is abdominal tenderness in the right upper quadrant, right lower quadrant and suprapubic area. There is no right CVA tenderness, left CVA tenderness, guarding or rebound.     Hernia: No hernia is present.  Musculoskeletal:        General: No swelling, tenderness or deformity.     Cervical back: Normal range of motion and neck supple. No rigidity.  Skin:    General: Skin is warm and dry.     Capillary Refill: Capillary refill takes less than 2 seconds.  Neurological:     General: No focal deficit present.      Mental Status: She is alert and oriented to person, place, and time.  Psychiatric:        Mood and Affect: Mood normal.    ED Results / Procedures / Treatments   Labs (all labs ordered are listed, but only abnormal results are displayed) Labs Reviewed  COMPREHENSIVE METABOLIC PANEL - Abnormal; Notable for the following components:      Result Value   Total Protein 6.3 (*)    AST 43 (*)    All other components within normal limits  LIPASE, BLOOD  CBC WITH DIFFERENTIAL/PLATELET    EKG None  Radiology No results found.  Procedures Procedures   Medications Ordered in ED Medications  HYDROcodone-acetaminophen (NORCO/VICODIN) 5-325 MG per tablet 1 tablet (has no administration in time range)  HYDROcodone-acetaminophen (NORCO/VICODIN) 5-325 MG per tablet 1 tablet (1 tablet Oral Given 08/13/21 1748)    ED Course  I have reviewed the triage vital signs and the nursing notes.  Pertinent labs & imaging results that were available during my care of the patient were reviewed by me and considered in my medical decision making (see chart for details).    MDM Rules/Calculators/A&P                          50 year old female presenting with abdominal pain.  Exam as above.  CBC revealed normal white count.  She is not tachycardic or febrile.  Low concern for sepsis.   Normal hemoglobin.  Low concern for hemorrhagic process.  Electrolytes reassuring.  Creatinine at baseline.  LFTs normal.  Lipase normal.  Low concern for pancreatitis.  CT abdomen pelvis showed no signs of diverticulitis, intra-abdominal mass, ovarian cyst or rupture, biliary pathology. Patient given analgesics and IV fluids.  On reassessment, she is resting comfortably.  Repeat abdominal exam benign.  She remains afebrile hemodynamically stable.  Discussed findings of our evaluation.  At this time, she is appropriate for discharge home with close follow-up with GI for further evaluation management of her ongoing abdominal pain.   Will trial prescription for Bentyl.  Encouraged dietary changes to include increased probiotics and yogurt.  Strict return ED precautions provided.  Final Clinical Impression(s) / ED Diagnoses Final diagnoses:  Lower abdominal pain    Rx / DC Orders ED Discharge Orders     None          Idamae Lusher, MD 08/14/21 Gentry Roch, MD 08/14/21 [phone]

## 2021-08-14 MED ORDER — HYOSCYAMINE SULFATE 0.125 MG SL SUBL
0.1250 mg | SUBLINGUAL_TABLET | Freq: Once | SUBLINGUAL | Status: AC
  Administered 2021-08-14: 0.125 mg via SUBLINGUAL
  Filled 2021-08-14: qty 1

## 2021-08-14 MED ORDER — DICYCLOMINE HCL 20 MG PO TABS: 20.0000 mg | ORAL_TABLET | Freq: Two times a day (BID) | ORAL | 0 refills | Status: DC

## 2021-08-14 MED ORDER — HYOSCYAMINE SULFATE 0.125 MG PO TABS
0.1250 mg | ORAL_TABLET | Freq: Once | ORAL | Status: DC
  Filled 2021-08-14: qty 1

## 2021-08-26 NOTE — Progress Notes (Signed)
PATIENT: Sheryl [name] DOB: 05-19-1971  REASON FOR VISIT: follow up for migraines  HISTORY FROM: patient Primary Neurologist: Dr. Terrace Arabia   HISTORY Sheryl [name] is a 50 years old female, seen in refer by  her primary care doctor [name], for evaluation of frequent migraine headache initial evaluation was June 15 2017.   I have reviewed the record, she has a long history of migraine headaches since teenager, gradually getting worse, began to see [name] in 2008, at that time she reported almost daily headaches,   She was treated with preventive medication atenolol 50 mg daily, which is helpful, previously also tried amitriptyline, nortriptyline, verapamil, which all helped some, but gradually loses benefit,   She was able to identify triggers such as cheese, onion, stress, caffeine, chocolate, heat,   Her headache overall has much improved, she is now taking Relpax as needed, that is the only triptan she has tried, at its best, it would take away her headache within one hour, but sometimes her headache would last for half days, occasionally she received Dilaudid, Phenergan and Demerol as abortive treatment   Her typical migraine are right lateralized severe pounding headache with associated light noise sensitivity, nauseous, vomiting, sometimes involving left side,   She was admitted to the hospital in September 2011 for 1 prolonged episode of headache with associated right side weakness and blurry vision, she had extensive evaluations, MRI of the brain showed no acute abnormality. Echocardiogram showed ejection fraction 65%, transcranial Doppler study was normal,   UPDATE 11-23-2017:YY Her father passed away on 08/02/17, she complains of increased headaches, 8-10 migraines each month,Maxalt does help her migraine, she felt wiped out afterwards     UPDATE Oct 31 2018: She is doing very well with atenolol 25 mg every day as headache prevention, on he has 1-2 headaches each  month, responding well to Relpax,   Update November 12, 2019 SS: Via virtual visit, reports headaches had been doing really well, for the last 30 days, has had 6 migraines.  Usually, she only has 2 a month.  Attributed to increased stress, her son is having a baby unexpectedly.  Currently, Zomig works well for her, but she only gets 3 tablets a month per insurance.  She remains on atenolol.   Update February 14, 2020 SS: After last visit, atenolol was increased to 50 mg for migraine prevention due to worsening headaches associated with increased stress.  She only took the higher dose for 1 month, went back down to 25 mg daily.  She is doing well.  On average 1-2 headaches a month. She has Zomig, Zanaflex, Zofran for acute headache-these work well for her. She only gets 3 Zomig tablets a month.  She is now exercising, trying to stay out of the heat which can be a trigger for migraines.  Presents today for evaluation unaccompanied. She is expecting her first grandchild.  Update February 19, 2021 SS: On average 1-2 migraines a month, April had none. This month has had 2, heat related. Insurance won't pay for Relpax this works best. For rescue now: Zomig, Zofran, tizanidine. Has slight headache today, came home from beach after 1 week. Didn't feel higher dose of atenolol was helpful.   Update August 27, 2021 SS: Lately only 2 migraines days a month, this is good for her. 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 At 1 time 7-10 migraines a month. Remains on atenolol 25 mg daily  for migraine prevention. Taking Zomig usually works great, recently had to take it twice due to lack of sleep. Insurance won't cover Relpax. Keep Zofran, tizanidine if needed for bad migraine. A lot less stress right now with family.   REVIEW OF SYSTEMS: Out of a complete 14 system review of symptoms, the patient complains only of the following symptoms, and all other reviewed systems are negative.  Headache  ALLERGIES: Allergies   Allergen Reactions   Advil [Ibuprofen] Anaphylaxis and Hives   Salicylates Anaphylaxis and Hives    Unknown reaction Sulfur meds   Codeine Other (See Comments)   Fentanyl Nausea And Vomiting    Migraine, vomiting    HOME MEDICATIONS: Outpatient Medications Prior to Visit  Medication Sig Dispense Refill   acetaminophen (TYLENOL) 500 MG tablet Take 500-1,000 mg by mouth every 6 (six) hours as needed (for pain/headaches.).     albuterol (VENTOLIN HFA) 108 (90 Base) MCG/ACT inhaler 2 puffs     budesonide-formoterol (SYMBICORT) 80-4.5 MCG/ACT inhaler Symbicort 80 mcg-4.5 mcg/actuation HFA aerosol inhaler  Inhale 2 puffs twice a day by inhalation route.     cetirizine (ZYRTEC) 10 MG tablet Take 10 mg by mouth daily as needed for allergies ((hay fever/pollen)).      dicyclomine (BENTYL) 20 MG tablet Take 1 tablet (20 mg total) by mouth 2 (two) times daily. 60 tablet 0   Multiple Vitamin (MULTIVITAMIN WITH MINERALS) TABS tablet Take 1 tablet by mouth daily.     ondansetron (ZOFRAN ODT) 4 MG disintegrating tablet Take 1 tablet (4 mg total) by mouth every 8 (eight) hours as needed. 20 tablet 11   tiZANidine (ZANAFLEX) 4 MG tablet TAKE 1 TABLET BY MOUTH EVERY 6 HOURS AS NEEDED FOR MUSCLE SPASMS 30 tablet 6   atenolol (TENORMIN) 25 MG tablet Take 1 tablet (25 mg total) by mouth daily. 90 tablet 4   zolmitriptan (ZOMIG) 5 MG tablet TAKE 1 TABLET BY MOUTH AT ONSET OF MIGRAINE. MAY REPEAT IN 2 HOURS IF NEEDED. MAX 2 IN 24 HOURS 12 tablet 11   No facility-administered medications prior to visit.    PAST MEDICAL HISTORY: Past Medical History:  Diagnosis Date   Allergic rhinitis    Anemia    Atypical chest pain 01/07/2012   Ct chest 10/2011:  No acute process, no PE to midsized pulmonary vessels.     Chronic migraine    Migraines    Miscarriage 08/1996, 12/1996   x2   Normal labor 03/19/2014   PONV (postoperative nausea and vomiting)    Postoperative state 03/20/2014   SVD (spontaneous vaginal  delivery)    x 2   Vitamin D deficiency     PAST SURGICAL HISTORY: Past Surgical History:  Procedure Laterality Date   ABDOMINAL HYSTERECTOMY     BREAST SURGERY     cyst removed from left breast; benign   CESAREAN SECTION N/A 03/20/2014   Procedure: CESAREAN SECTION;  Surgeon: Loney Laurence, MD;  Location: [facility];  Service: Obstetrics;  Laterality: N/A;   cyst removed from L breast  10/2006   benign   CYSTOSCOPY N/A 06/01/2017   Procedure: CYSTOSCOPY;  Surgeon: Carrington Clamp, MD;  Location: [facility];  Service: Gynecology;  Laterality: N/A;   DILATION AND CURETTAGE OF UTERUS  08/1996, 12/1996   x 2 MAB   DILATION AND CURETTAGE OF UTERUS     KNEE ARTHROSCOPY Right 01/2016   KNEE ARTHROSCOPY Right 08/2016   LEFT HEART CATH AND CORONARY ANGIOGRAPHY N/A 04/14/2018  Procedure: LEFT HEART CATH AND CORONARY ANGIOGRAPHY;  Surgeon: Yvonne Kendall, MD;  Location: [facility];  Service: Cardiovascular;  Laterality: N/A;   live births  10/07/97, 02/08/2000   x2   ROBOTIC ASSISTED TOTAL HYSTERECTOMY WITH SALPINGECTOMY Bilateral 06/01/2017   Procedure: ROBOTIC ASSISTED TOTAL HYSTERECTOMY WITH SALPINGECTOMY WITH PARTIAL REMOVAL OF HERNIA SAC;  Surgeon: Carrington Clamp, MD;  Location: [facility];  Service: Gynecology;  Laterality: Bilateral;   TUBAL LIGATION Bilateral 03/20/2014   Procedure: BILATERAL TUBAL LIGATION;  Surgeon: Loney Laurence, MD;  Location: [facility];  Service: Obstetrics;  Laterality: Bilateral;   UNILATERAL SALPINGECTOMY Right 03/20/2014   Procedure: UNILATERAL SALPINGECTOMY;  Surgeon: Loney Laurence, MD;  Location: [facility];  Service: Obstetrics;  Laterality: Right;    FAMILY HISTORY: Family History  Problem Relation Age of Onset   Heart attack Father    Hypertension Father    Hyperlipidemia Father    Asthma Son    Heart disease Maternal Grandfather    Cancer Paternal Grandmother        unsure what kind    SOCIAL HISTORY: Social History   Socioeconomic History    Marital status: Married    Spouse name: Not on file   Number of children: 3   Years of education: 2.5 yrs college   Highest education level: Not on file  Occupational History   Occupation: Production designer, theatre/television/film. Rep    Employer: [employer]  Tobacco Use   Smoking status: Never   Smokeless tobacco: Never  Vaping Use   Vaping Use: Never used  Substance and Sexual Activity   Alcohol use: No   Drug use: No   Sexual activity: Yes    Birth control/protection: Surgical  Other Topics Concern   Not on file  Social History Narrative   Lives at home with husband and three children.   Left-handed.   No caffeine use.       Social Determinants of Health   Financial Resource Strain: Not on file  Food Insecurity: Not on file  Transportation Needs: Not on file  Physical Activity: Not on file  Stress: Not on file  Social Connections: Not on file  Intimate Partner Violence: Not on file   PHYSICAL EXAM  Vitals:   08/27/21 1518  BP: 123/88  Pulse: 84  Weight: 291 lb (132 kg)  Height: 5\' 7"  (1.702 m)   Body mass index is 45.58 kg/m.  Generalized: Well developed, in no acute distress   Neurological examination  Mentation: Alert oriented to time, place, history taking. Follows all commands speech and language fluent Cranial nerve II-XII: Pupils were equal round reactive to light. Extraocular movements were full, visual field were full on confrontational test. Facial sensation and strength were normal.  Head turning and shoulder shrug  were normal and symmetric. Motor: The motor testing reveals 5 over 5 strength of all 4 extremities. Good symmetric motor tone is noted throughout.  Sensory: Sensory testing is intact to soft touch on all 4 extremities. No evidence of extinction is noted.  Coordination: Cerebellar testing reveals good finger-nose-finger and heel-to-shin bilaterally.  Gait and station: Gait is normal.  Reflexes: Deep tendon reflexes are symmetric and  normal bilaterally.   DIAGNOSTIC DATA (LABS, IMAGING, TESTING) - I reviewed patient records, labs, notes, testing and imaging myself where available.  Lab Results  Component Value Date   WBC 8.6 08/13/2021   HGB 14.1 08/13/2021   HCT 42.6 08/13/2021   MCV 90.3 08/13/2021   PLT  382 08/13/2021      Component Value Date/Time   NA 139 08/13/2021 1750   NA 138 04/26/2018 1204   K 5.0 08/13/2021 1750   CL 100 08/13/2021 1750   CO2 30 08/13/2021 1750   GLUCOSE 94 08/13/2021 1750   BUN 6 08/13/2021 1750   BUN 10 04/26/2018 1204   CREATININE 0.70 08/13/2021 1750   CALCIUM 9.4 08/13/2021 1750   PROT 6.3 (L) 08/13/2021 1750   ALBUMIN 3.7 08/13/2021 1750   AST 43 (H) 08/13/2021 1750   ALT 27 08/13/2021 1750   ALKPHOS 55 08/13/2021 1750   BILITOT 1.0 08/13/2021 1750   GFRNONAA >60 08/13/2021 1750   GFRAA 116 04/26/2018 1204   Lab Results  Component Value Date   CHOL 168 04/26/2018   HDL 40 04/26/2018   LDLCALC 87 04/26/2018   TRIG 205 (H) 04/26/2018   CHOLHDL 4.2 04/26/2018   Lab Results  Component Value Date   HGBA1C  06/03/2010    5.5 (NOTE)                                                                       According to the ADA Clinical Practice Recommendations for 2011, when HbA1c is used as a screening test:   >=6.5%   Diagnostic of Diabetes Mellitus           (if abnormal result  is confirmed)  5.7-6.4%   Increased risk of developing Diabetes Mellitus  References:Diagnosis and Classification of Diabetes Mellitus,Diabetes Care,2011,34(Suppl 1):S62-S69 and Standards of Medical Care in         Diabetes - 2011,Diabetes Care,2011,34  (Suppl 1):S11-S61.   No results found for: VITAMINB12 No results found for: TSH  ASSESSMENT AND PLAN 50 y.o. year old female  has a past medical history of Allergic rhinitis, Anemia, Atypical chest pain (01/07/2012), Chronic migraine, Migraines, Miscarriage (08/1996, 12/1996), Normal labor (03/19/2014), PONV (postoperative nausea and vomiting),  Postoperative state (03/20/2014), SVD (spontaneous vaginal delivery), and Vitamin D deficiency. here with:  1.  Chronic migraine headache  -Sheryl [name] is doing much better, headache under much better control  -Continue atenolol 25 mg daily for migraine prevention -Continue Zomig as needed for acute headache, may combine with Zofran, tizanidine for prolonged headache -We will follow-up in 1 year or sooner if needed  Sheryl Kluver, DNP 08/27/2021, 3:39 PM [facility] [address], [address] [place], [place] [address] [phone]

## 2021-08-27 ENCOUNTER — Encounter: Payer: Self-pay | Admitting: Neurology

## 2021-08-27 ENCOUNTER — Ambulatory Visit (INDEPENDENT_AMBULATORY_CARE_PROVIDER_SITE_OTHER): Payer: 59 | Admitting: Neurology

## 2021-08-27 VITALS — BP 123/88 | HR 84 | Ht 67.0 in | Wt 291.0 lb

## 2021-08-27 DIAGNOSIS — G43709 Chronic migraine without aura, not intractable, without status migrainosus: Secondary | ICD-10-CM | POA: Diagnosis not present

## 2021-08-27 MED ORDER — ZOLMITRIPTAN 5 MG PO TABS: ORAL_TABLET | ORAL | 11 refills | Status: DC

## 2021-08-27 MED ORDER — ATENOLOL 25 MG PO TABS: 25.0000 mg | ORAL_TABLET | Freq: Every day | ORAL | 4 refills | Status: DC

## 2021-08-27 NOTE — Patient Instructions (Signed)
Great to see you today! °Continue current medications °See you back in 1 year  °

## 2021-09-15 ENCOUNTER — Ambulatory Visit: Payer: 59 | Admitting: Physician Assistant

## 2021-10-02 ENCOUNTER — Encounter: Payer: Self-pay | Admitting: Neurology

## 2021-10-09 ENCOUNTER — Ambulatory Visit (INDEPENDENT_AMBULATORY_CARE_PROVIDER_SITE_OTHER): Payer: 59 | Admitting: Plastic Surgery

## 2021-10-09 ENCOUNTER — Other Ambulatory Visit: Payer: Self-pay

## 2021-10-09 VITALS — BP 117/73 | HR 70 | Ht 67.5 in | Wt 289.0 lb

## 2021-10-09 DIAGNOSIS — N62 Hypertrophy of breast: Secondary | ICD-10-CM

## 2021-10-09 DIAGNOSIS — G8929 Other chronic pain: Secondary | ICD-10-CM

## 2021-10-09 DIAGNOSIS — M546 Pain in thoracic spine: Secondary | ICD-10-CM | POA: Diagnosis not present

## 2021-10-10 NOTE — Progress Notes (Signed)
Referring Provider Laurann MontanaWhite, Cynthia, MD [phone] [address] [place],  [place] [id number]   CC:  Breast hypertrophy   Charlette CaffeyLois H Mui is an 51 y.o. female.  HPI:  Mammary Hyperplasia: The patient is a 51 y.o. female with a history of mammary hyperplasia for several years.  She has extremely large breasts causing symptoms that include the following: Back pain in the upper and lower back, including neck pain. She pulls or pins her bra straps to provide better lift and relief of the pressure and pain. She notices relief by holding her breast up manually.  Her shoulder straps cause grooves and pain and pressure that requires padding for relief. Pain medication is sometimes required with motrin and tylenol.  Activities that are hindered by enlarged breasts include: exercise and running.  She has tried supportive clothing as well as fitted bras without improvement.  Her breasts are extremely large and fairly symmetric with left slightly larger.  She has hyperpigmentation of the inframammary area on both sides.      Preoperative bra size = 50DDD cup.    [facility].  Family history of breast cancer:  none.  Tobacco use:  none.   The patient expresses the desire to pursue surgical intervention.   Allergies  Allergen Reactions   Advil [Ibuprofen] Anaphylaxis and Hives   Salicylates Anaphylaxis and Hives    Unknown reaction Sulfur meds   Codeine Other (See Comments)   Fentanyl Nausea And Vomiting    Migraine, vomiting    Outpatient Encounter Medications as of 10/09/2021  Medication Sig   acetaminophen (TYLENOL) 500 MG tablet Take 500-1,000 mg by mouth every 6 (six) hours as needed (for pain/headaches.).   albuterol (VENTOLIN HFA) 108 (90 Base) MCG/ACT inhaler 2 puffs   atenolol (TENORMIN) 25 MG tablet Take 1 tablet (25 mg total) by mouth daily.   budesonide-formoterol (SYMBICORT) 80-4.5 MCG/ACT inhaler Symbicort 80 mcg-4.5 mcg/actuation HFA aerosol  inhaler  Inhale 2 puffs twice a day by inhalation route.   cetirizine (ZYRTEC) 10 MG tablet Take 10 mg by mouth daily as needed for allergies ((hay fever/pollen)).    Multiple Vitamin (MULTIVITAMIN WITH MINERALS) TABS tablet Take 1 tablet by mouth daily.   ondansetron (ZOFRAN ODT) 4 MG disintegrating tablet Take 1 tablet (4 mg total) by mouth every 8 (eight) hours as needed.   tiZANidine (ZANAFLEX) 4 MG tablet TAKE 1 TABLET BY MOUTH EVERY 6 HOURS AS NEEDED FOR MUSCLE SPASMS   zolmitriptan (ZOMIG) 5 MG tablet TAKE 1 TABLET BY MOUTH AT ONSET OF MIGRAINE. MAY REPEAT IN 2 HOURS IF NEEDED. MAX 2 IN 24 HOURS   dicyclomine (BENTYL) 20 MG tablet Take 1 tablet (20 mg total) by mouth 2 (two) times daily.   No facility-administered encounter medications on file as of 10/09/2021.     Past Medical History:  Diagnosis Date   Allergic rhinitis    Anemia    Atypical chest pain 01/07/2012   Ct chest 10/2011:  No acute process, no PE to midsized pulmonary vessels.     Chronic migraine    Migraines    Miscarriage 08/1996, 12/1996   x2   Normal labor 03/19/2014   PONV (postoperative nausea and vomiting)    Postoperative state 03/20/2014   SVD (spontaneous vaginal delivery)    x 2   Vitamin D deficiency     Past Surgical History:  Procedure Laterality Date   ABDOMINAL HYSTERECTOMY     BREAST SURGERY  cyst removed from left breast; benign   CESAREAN SECTION N/A 03/20/2014   Procedure: CESAREAN SECTION;  Surgeon: Loney Laurence, MD;  Location: [facility];  Service: Obstetrics;  Laterality: N/A;   cyst removed from L breast  10/2006   benign   CYSTOSCOPY N/A 06/01/2017   Procedure: CYSTOSCOPY;  Surgeon: Carrington Clamp, MD;  Location: [facility];  Service: Gynecology;  Laterality: N/A;   DILATION AND CURETTAGE OF UTERUS  08/1996, 12/1996   x 2 MAB   DILATION AND CURETTAGE OF UTERUS     KNEE ARTHROSCOPY Right 01/2016   KNEE ARTHROSCOPY Right 08/2016   LEFT HEART CATH AND CORONARY ANGIOGRAPHY N/A 04/14/2018    Procedure: LEFT HEART CATH AND CORONARY ANGIOGRAPHY;  Surgeon: Yvonne Kendall, MD;  [facility];  Service: Cardiovascular;  Laterality: N/A;   live births  10/07/97, 02/08/2000   x2   ROBOTIC ASSISTED TOTAL HYSTERECTOMY WITH SALPINGECTOMY Bilateral 06/01/2017   Procedure: ROBOTIC ASSISTED TOTAL HYSTERECTOMY WITH SALPINGECTOMY WITH PARTIAL REMOVAL OF HERNIA SAC;  Surgeon: Carrington Clamp, MD;  Location: [facility];  Service: Gynecology;  Laterality: Bilateral;   TUBAL LIGATION Bilateral 03/20/2014   Procedure: BILATERAL TUBAL LIGATION;  Surgeon: Loney Laurence, MD;  Location: [facility];  Service: Obstetrics;  Laterality: Bilateral;   UNILATERAL SALPINGECTOMY Right 03/20/2014   Procedure: UNILATERAL SALPINGECTOMY;  Surgeon: Loney Laurence, MD;  Location: [facility];  Service: Obstetrics;  Laterality: Right;    Family History  Problem Relation Age of Onset   Heart attack Father    Hypertension Father    Hyperlipidemia Father    Asthma Son    Heart disease Maternal Grandfather    Cancer Paternal Grandmother        unsure what kind    Social History   Social History Narrative   Lives at home with husband and three children.   Left-handed.   No caffeine use.         Review of Systems General: Denies fevers, chills, weight loss CV: Denies chest pain, shortness of breath, palpitations   Physical Exam Vitals with BMI 10/09/2021 08/27/2021 08/14/2021  Height 5' 7.5" 5\' 7"  -  Weight 289 lbs 291 lbs -  BMI 44.57 45.57 -  Systolic 117 123  Diastolic 73 88 67  Pulse 70 84 71    General:  No acute distress,  Alert and oriented, Non-Toxic, Normal speech and affect Breast:  The sternal to nipple distance on the right is 42 cm and the left is 42 cm.  The IMF distance is 17 R,18 L cm.  Assessment/Plan The patient has bilateral symptomatic macromastia.  She is a good candidate for a breast reduction.  We discussed likely free nipple based on size.  She is interested in  pursuing surgical treatment.  She has tried supportive garments and fitted bras with no relief.  The details of breast reduction surgery were discussed.  I explained the procedure in detail along the with the expected scars.  The risks were discussed in detail and include bleeding, infection, damage to surrounding structures, need for additional procedures, nipple loss, change in nipple sensation, persistent pain, contour irregularities and asymmetries.  I explained that breast feeding is often not possible after breast reduction surgery.  We discussed the expected postoperative course with an overall recovery period of about 1 month.  She demonstrated full understanding of all risks.  We discussed her personal risk factors that include BMI.  The patient is interested in pursuing surgical  treatment.  The estimated excess breast tissue to be removed at the time of surgery = greater than 1000 grams on each side.   Janne Napoleon 10/10/2021, 7:07 PM

## 2021-11-13 ENCOUNTER — Other Ambulatory Visit: Payer: Self-pay | Admitting: Orthopedic Surgery

## 2021-11-13 DIAGNOSIS — M25562 Pain in left knee: Secondary | ICD-10-CM

## 2021-11-18 ENCOUNTER — Telehealth: Payer: Self-pay | Admitting: Plastic Surgery

## 2021-11-18 NOTE — Telephone Encounter (Signed)
Called to schedule patient's surgery, but she advised that she may have torn her meniscus and is waiting to hear if she will have to have surgery. Patient will call me back once she finds out if she will need knee surgery so I can call and cancel the authorization. Advised patient that if she does have the other surgery, we can have her follow up once she has recovered and redo the authorization.  ?

## 2021-11-21 ENCOUNTER — Ambulatory Visit
Admission: RE | Admit: 2021-11-21 | Discharge: 2021-11-21 | Disposition: A | Discharge status: Home / Self Care | Payer: 59 | Source: Ambulatory Visit | Attending: Orthopedic Surgery | Admitting: Orthopedic Surgery

## 2021-11-21 ENCOUNTER — Other Ambulatory Visit: Payer: Self-pay

## 2021-11-21 DIAGNOSIS — M25562 Pain in left knee: Secondary | ICD-10-CM

## 2022-01-15 ENCOUNTER — Telehealth: Payer: Self-pay | Admitting: Plastic Surgery

## 2022-01-15 NOTE — Telephone Encounter (Signed)
Returned patient's call. She is having to have a total knee replacement and so she has to put this surgery on hold. Advised that when she is healed and ready to move forward to call us and we will schedule  a new follow up consult and start the process over as far as authorization goes. Patient understands.  ?

## 2022-06-29 NOTE — Progress Notes (Incomplete)
CARDIOLOGY CONSULT NOTE       Patient ID: Sheryl [name] MRN: [id number] DOB/AGE: [birth year] 51 y.o.  Admit date: (Not on file) Referring Physician: Moreen Fowler Primary Physician: Harlan Stains, MD Primary Cardiologist: Johnsie Cancel Reason for Consultation: Aortic Aneurysm/Dyspnea   HPI:  51 y.o. referred by Dr Moreen Fowler for aortic disease and dyspnea. Seen by cardiology in 2019 had an abnormal myovue but cath 04/14/18 showed no significant CAD. CTA done 06/14/21 for chest pain,dizziness dyspnea and weakness showed no PE and 4.1 cm ascending thoracic aortic aneurysm She was set up to see Dr Roxan Hockey CV[facility]   Seen in ER 06/14/21 with atypical chest pain sharp left sided to arm resolved after 15 minutes ? Associated tunnel vision and near syncope Chronic LE edema with negative [place] for DVT in 2021 She r/o and had no acute ECG changes  She is still working for [employer] from home has son Ovid Curd at home with her he is 8    ROS All other systems reviewed and negative except as noted above  Past Medical History:  Diagnosis Date  . Allergic rhinitis   . Anemia   . Atypical chest pain 01/07/2012   Ct chest 10/2011:  No acute process, no PE to midsized pulmonary vessels.    . Chronic migraine   . Migraines   . Miscarriage 08/1996, 12/1996   x2  . Normal labor 03/19/2014  . PONV (postoperative nausea and vomiting)   . Postoperative state 03/20/2014  . SVD (spontaneous vaginal delivery)    x 2  . Vitamin D deficiency     Family History  Problem Relation Age of Onset  . Heart attack Father   . Hypertension Father   . Hyperlipidemia Father   . Asthma Son   . Heart disease Maternal Grandfather   . Cancer Paternal Grandmother        unsure what kind    Social History   Socioeconomic History  . Marital status: Married    Spouse name: Not on file  . Number of children: 3  . Years of education: 2.5 yrs college  . Highest education level: Not on file  Occupational History  . Occupation: Medical illustrator. Rep    Employer: [employer]  Tobacco Use  . Smoking status: Never  . Smokeless tobacco: Never  Vaping Use  . Vaping Use: Never used  Substance and Sexual Activity  . Alcohol use: No  . Drug use: No  . Sexual activity: Yes    Birth control/protection: Surgical  Other Topics Concern  . Not on file  Social History Narrative   Lives at home with husband and three children.   Left-handed.   No caffeine use.       Social Determinants of Health   Financial Resource Strain: Not on file  Food Insecurity: Not on file  Transportation Needs: Not on file  Physical Activity: Not on file  Stress: Not on file  Social Connections: Not on file  Intimate Partner Violence: Not on file    Past Surgical History:  Procedure Laterality Date  . ABDOMINAL HYSTERECTOMY    . BREAST SURGERY     cyst removed from left breast; benign  . CESAREAN SECTION N/A 03/20/2014   Procedure: CESAREAN SECTION;  Surgeon: Daria Pastures, MD;  Location: [facility];  Service: Obstetrics;  Laterality: N/A;  . cyst removed from L breast  10/2006   benign  . CYSTOSCOPY N/A 06/01/2017   Procedure: CYSTOSCOPY;  Surgeon: Bobbye Charleston, MD;  Location: [facility];  Service: Gynecology;  Laterality: N/A;  . DILATION AND CURETTAGE OF UTERUS  08/1996, 12/1996   x 2 MAB  . DILATION AND CURETTAGE OF UTERUS    . KNEE ARTHROSCOPY Right 01/2016  . KNEE ARTHROSCOPY Right 08/2016  . LEFT HEART CATH AND CORONARY ANGIOGRAPHY N/A 04/14/2018   Procedure: LEFT HEART CATH AND CORONARY ANGIOGRAPHY;  Surgeon: Yvonne Kendall, MD;  [facility];  Service: Cardiovascular;  Laterality: N/A;  . live births  10/07/97, 02/08/2000   x2  . ROBOTIC ASSISTED TOTAL HYSTERECTOMY WITH SALPINGECTOMY Bilateral 06/01/2017   Procedure: ROBOTIC ASSISTED TOTAL HYSTERECTOMY WITH SALPINGECTOMY WITH PARTIAL REMOVAL OF HERNIA SAC;  Surgeon: Carrington Clamp, MD;  Location: [facility];  Service: Gynecology;  Laterality: Bilateral;  .  TUBAL LIGATION Bilateral 03/20/2014   Procedure: BILATERAL TUBAL LIGATION;  Surgeon: Loney Laurence, MD;  Location: [facility];  Service: Obstetrics;  Laterality: Bilateral;  . UNILATERAL SALPINGECTOMY Right 03/20/2014   Procedure: UNILATERAL SALPINGECTOMY;  Surgeon: Loney Laurence, MD;  Location: [facility];  Service: Obstetrics;  Laterality: Right;      Current Outpatient Medications:  .  acetaminophen (TYLENOL) 500 MG tablet, Take 500-1,000 mg by mouth every 6 (six) hours as needed (for pain/headaches.)., Disp: , Rfl:  .  albuterol (VENTOLIN HFA) 108 (90 Base) MCG/ACT inhaler, 2 puffs, Disp: , Rfl:  .  atenolol (TENORMIN) 25 MG tablet, Take 1 tablet (25 mg total) by mouth daily., Disp: 90 tablet, Rfl: 4 .  budesonide-formoterol (SYMBICORT) 80-4.5 MCG/ACT inhaler, Symbicort 80 mcg-4.5 mcg/actuation HFA aerosol inhaler  Inhale 2 puffs twice a day by inhalation route., Disp: , Rfl:  .  cetirizine (ZYRTEC) 10 MG tablet, Take 10 mg by mouth daily as needed for allergies ((hay fever/pollen)). , Disp: , Rfl:  .  dicyclomine (BENTYL) 20 MG tablet, Take 1 tablet (20 mg total) by mouth 2 (two) times daily., Disp: 60 tablet, Rfl: 0 .  Multiple Vitamin (MULTIVITAMIN WITH MINERALS) TABS tablet, Take 1 tablet by mouth daily., Disp: , Rfl:  .  ondansetron (ZOFRAN ODT) 4 MG disintegrating tablet, Take 1 tablet (4 mg total) by mouth every 8 (eight) hours as needed., Disp: 20 tablet, Rfl: 11 .  tiZANidine (ZANAFLEX) 4 MG tablet, TAKE 1 TABLET BY MOUTH EVERY 6 HOURS AS NEEDED FOR MUSCLE SPASMS, Disp: 30 tablet, Rfl: 6 .  zolmitriptan (ZOMIG) 5 MG tablet, TAKE 1 TABLET BY MOUTH AT ONSET OF MIGRAINE. MAY REPEAT IN 2 HOURS IF NEEDED. MAX 2 IN 24 HOURS, Disp: 12 tablet, Rfl: 11    Physical Exam: Last menstrual period 05/26/2017, unknown if currently breastfeeding.    Affect appropriate Healthy:  appears stated age HEENT: normal Neck supple with no adenopathy JVP normal no bruits no thyromegaly Lungs clear  with no wheezing and good diaphragmatic motion Heart:  S1/S2 no murmur, no rub, gallop or click PMI normal Abdomen: benighn, BS positve, no tenderness, no AAA no bruit.  No HSM or HJR Distal pulses intact with no bruits No edema Neuro non-focal Skin warm and dry No muscular weakness   Labs:   Lab Results  Component Value Date   WBC 8.6 08/13/2021   HGB 14.1 08/13/2021   HCT 42.6 08/13/2021   MCV 90.3 08/13/2021   PLT 382 08/13/2021   No results for input(s): "NA", "K", "CL", "CO2", "BUN", "CREATININE", "CALCIUM", "PROT", "BILITOT", "ALKPHOS", "ALT", "AST", "GLUCOSE" in the last 168 hours.  Invalid input(s): "LABALBU" Lab Results  Component Value Date  CKTOTAL 61 06/03/2010   CKMB 0.7 06/03/2010   TROPONINI 0.01        NO INDICATION OF MYOCARDIAL INJURY. 06/03/2010    Lab Results  Component Value Date   CHOL 168 04/26/2018   CHOL  06/03/2010    135        ATP III CLASSIFICATION:  <200     mg/dL   Desirable  200-239  mg/dL   Borderline High  >=240    mg/dL   High          Lab Results  Component Value Date   HDL 40 04/26/2018   HDL 47 06/03/2010   Lab Results  Component Value Date   LDLCALC 87 04/26/2018   [place]  06/03/2010    49        Total Cholesterol/HDL:CHD Risk Coronary Heart Disease Risk Table                     Men   Women  1/2 Average Risk   3.4   3.3  Average Risk       5.0   4.4  2 X Average Risk   9.6   7.1  3 X Average Risk  23.4   11.0        Use the calculated Patient Ratio above and the CHD Risk Table to determine the patient's CHD Risk.        ATP III CLASSIFICATION (LDL):  <100     mg/dL   Optimal  100-129  mg/dL   Near or Above                    Optimal  130-159  mg/dL   Borderline  160-189  mg/dL   High  >190     mg/dL   Very High   Lab Results  Component Value Date   TRIG 205 (H) 04/26/2018   TRIG 194 (H) 06/03/2010   Lab Results  Component Value Date   CHOLHDL 4.2 04/26/2018   CHOLHDL 2.9 06/03/2010   No  results found for: "LDLDIRECT"    Radiology: No results found.  EKG: NSR poor R wave progression normal ST;s 06/15/21   ASSESSMENT AND PLAN:   Chest Pain: atypical r/ in ER 06/15/21 previous normal cath in 2019 Do not think acute ischemic testing needed. See below  Dsypnea:  likely functional check echo  Aortic Aneurysm :  F/U [place] continue atenolol Since she is due for CTA for aneurysm f/u with do cardiac CTA and image from clavicles down using retrospective scan to assess coronary arteries and aorta  Macromastia:  post breast reduction surgery this year   Atenolol 50 mg 2 hours before study BMET Cardiac CTA  F/U in  a year   Signed: Jenkins Rouge 06/29/2022, 8:34 AM

## 2022-07-02 ENCOUNTER — Ambulatory Visit: Payer: 59 | Admitting: Cardiovascular Disease

## 2022-07-28 ENCOUNTER — Other Ambulatory Visit: Payer: Self-pay | Admitting: Thoracic Surgery (Cardiothoracic Vascular Surgery)

## 2022-07-28 DIAGNOSIS — I712 Thoracic aortic aneurysm, without rupture, unspecified: Secondary | ICD-10-CM

## 2022-08-11 ENCOUNTER — Ambulatory Visit (INDEPENDENT_AMBULATORY_CARE_PROVIDER_SITE_OTHER): Payer: 59 | Admitting: Neurology

## 2022-08-11 ENCOUNTER — Encounter: Payer: Self-pay | Admitting: Neurology

## 2022-08-11 VITALS — BP 128/81 | HR 72 | Ht 67.0 in | Wt 300.0 lb

## 2022-08-11 DIAGNOSIS — G43709 Chronic migraine without aura, not intractable, without status migrainosus: Secondary | ICD-10-CM | POA: Diagnosis not present

## 2022-08-11 MED ORDER — ONDANSETRON 4 MG PO TBDP: 4.0000 mg | ORAL_TABLET | Freq: Three times a day (TID) | ORAL | 3 refills | Status: DC | PRN

## 2022-08-11 MED ORDER — ATENOLOL 25 MG PO TABS: 25.0000 mg | ORAL_TABLET | Freq: Every day | ORAL | 4 refills | Status: DC

## 2022-08-11 MED ORDER — CYCLOBENZAPRINE HCL 5 MG PO TABS: 5.0000 mg | ORAL_TABLET | Freq: Three times a day (TID) | ORAL | 1 refills | Status: DC | PRN

## 2022-08-11 MED ORDER — ZOLMITRIPTAN 5 MG PO TABS: ORAL_TABLET | ORAL | 11 refills | Status: DC

## 2022-08-11 NOTE — Progress Notes (Signed)
PATIENT: Sheryl [name] DOB: 12/17/1970  REASON FOR VISIT: follow up for migraines  HISTORY FROM: patient, son  Primary Neurologist: Dr. Terrace ArabiaYan   HISTORY Sheryl CaffeyLois H Geoghegan is a 51 years old female, seen in refer by  her primary care doctor Johny BlamerHarris, William, for evaluation of frequent migraine headache initial evaluation was June 15 2017.   I have reviewed the record, she has a long history of migraine headaches since teenager, gradually getting worse, began to see Dr. Vela ProseLewitt in 2008, at that time she reported almost daily headaches,   She was treated with preventive medication atenolol 50 mg daily, which is helpful, previously also tried amitriptyline, nortriptyline, verapamil, which all helped some, but gradually loses benefit,   She was able to identify triggers such as cheese, onion, stress, caffeine, chocolate, heat,   Her headache overall has much improved, she is now taking Relpax as needed, that is the only triptan she has tried, at its best, it would take away her headache within one hour, but sometimes her headache would last for half days, occasionally she received Dilaudid, Phenergan and Demerol as abortive treatment   Her typical migraine are right lateralized severe pounding headache with associated light noise sensitivity, nauseous, vomiting, sometimes involving left side,   She was admitted to the hospital in September 2011 for 1 prolonged episode of headache with associated right side weakness and blurry vision, she had extensive evaluations, MRI of the brain showed no acute abnormality. Echocardiogram showed ejection fraction 65%, transcranial Doppler study was normal,   UPDATE Oct 27 2017:YY Her father passed away on Jul 06 2017, she complains of increased headaches, 8-10 migraines each month,Maxalt does help her migraine, she felt wiped out afterwards     UPDATE Oct 31 2018: She is doing very well with atenolol 25 mg every day as headache prevention, on he has 1-2 headaches  each month, responding well to Relpax,   Update November 12, 2019 SS: Via virtual visit, reports headaches had been doing really well, for the last 30 days, has had 6 migraines.  Usually, she only has 2 a month.  Attributed to increased stress, her son is having a baby unexpectedly.  Currently, Zomig works well for her, but she only gets 3 tablets a month per insurance.  She remains on atenolol.   Update February 14, 2020 SS: After last visit, atenolol was increased to 50 mg for migraine prevention due to worsening headaches associated with increased stress.  She only took the higher dose for 1 month, went back down to 25 mg daily.  She is doing well.  On average 1-2 headaches a month. She has Zomig, Zanaflex, Zofran for acute headache-these work well for her. She only gets 3 Zomig tablets a month.  She is now exercising, trying to stay out of the heat which can be a trigger for migraines.  Presents today for evaluation unaccompanied. She is expecting her first grandchild.  Update February 19, 2021 SS: On average 1-2 migraines a month, April had none. This month has had 2, heat related. Insurance won't pay for Relpax this works best. For rescue now: Zomig, Zofran, tizanidine. Has slight headache today, came home from beach after 1 week. Didn't feel higher dose of atenolol was helpful.   Update August 27, 2021 SS: Lately only 2 migraines days a month, this is good for her. 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 At 1 time 7-10 migraines a month. Remains on atenolol 25  mg daily for migraine prevention. Taking Zomig usually works great, recently had to take it twice due to lack of sleep. Insurance won't cover Relpax. Keep Zofran, tizanidine if needed for bad migraine. A lot less stress right now with family.   Update August 11, 2022 SS: Doing well, got a promotion, having a new grandbaby. Migraines on average 1-2 a month. Remains on atenolol 25 mg daily, tried to stop but had more headaches. Zomig works  well, works usually within 30 minutes, can be 2 hours. Will take Zofran for nausea if needed. Sometimes neck tension can trigger migraine, uses tizanidine, but finds that Flexeril works better. Needs knee replacement but needs to lose weight first   REVIEW OF SYSTEMS: Out of a complete 14 system review of symptoms, the patient complains only of the following symptoms, and all other reviewed systems are negative.  See HPI  ALLERGIES: Allergies  Allergen Reactions   Advil [Ibuprofen] Anaphylaxis and Hives   Salicylates Anaphylaxis and Hives    Unknown reaction Sulfur meds   Codeine Other (See Comments)   Fentanyl Nausea And Vomiting    Migraine, vomiting    HOME MEDICATIONS: Outpatient Medications Prior to Visit  Medication Sig Dispense Refill   acetaminophen (TYLENOL) 500 MG tablet Take 500-1,000 mg by mouth every 6 (six) hours as needed (for pain/headaches.).     albuterol (VENTOLIN HFA) 108 (90 Base) MCG/ACT inhaler 2 puffs     budesonide-formoterol (SYMBICORT) 80-4.5 MCG/ACT inhaler Symbicort 80 mcg-4.5 mcg/actuation HFA aerosol inhaler  Inhale 2 puffs twice a day by inhalation route.     cetirizine (ZYRTEC) 10 MG tablet Take 10 mg by mouth daily as needed for allergies ((hay fever/pollen)).      Multiple Vitamin (MULTIVITAMIN WITH MINERALS) TABS tablet Take 1 tablet by mouth daily.     atenolol (TENORMIN) 25 MG tablet Take 1 tablet (25 mg total) by mouth daily. 90 tablet 4   ondansetron (ZOFRAN ODT) 4 MG disintegrating tablet Take 1 tablet (4 mg total) by mouth every 8 (eight) hours as needed. 20 tablet 11   tiZANidine (ZANAFLEX) 4 MG tablet TAKE 1 TABLET BY MOUTH EVERY 6 HOURS AS NEEDED FOR MUSCLE SPASMS 30 tablet 6   zolmitriptan (ZOMIG) 5 MG tablet TAKE 1 TABLET BY MOUTH AT ONSET OF MIGRAINE. MAY REPEAT IN 2 HOURS IF NEEDED. MAX 2 IN 24 HOURS 12 tablet 11   dicyclomine (BENTYL) 20 MG tablet Take 1 tablet (20 mg total) by mouth 2 (two) times daily. 60 tablet 0   No  facility-administered medications prior to visit.    PAST MEDICAL HISTORY: Past Medical History:  Diagnosis Date   Allergic rhinitis    Anemia    Atypical chest pain 01/07/2012   Ct chest 10/2011:  No acute process, no PE to midsized pulmonary vessels.     Chronic migraine    Migraines    Miscarriage 08/1996, 12/1996   x2   Normal labor 03/19/2014   PONV (postoperative nausea and vomiting)    Postoperative state 03/20/2014   SVD (spontaneous vaginal delivery)    x 2   Vitamin D deficiency     PAST SURGICAL HISTORY: Past Surgical History:  Procedure Laterality Date   ABDOMINAL HYSTERECTOMY     BREAST SURGERY     cyst removed from left breast; benign   CESAREAN SECTION N/A 03/20/2014   Procedure: CESAREAN SECTION;  Surgeon: Loney Laurence, MD;  Location: [facility];  Service: Obstetrics;  Laterality: N/A;   cyst removed  from L breast  10/2006   benign   CYSTOSCOPY N/A 06/01/2017   Procedure: CYSTOSCOPY;  Surgeon: Carrington Clamp, MD;  Location: [facility];  Service: Gynecology;  Laterality: N/A;   DILATION AND CURETTAGE OF UTERUS  08/1996, 12/1996   x 2 MAB   DILATION AND CURETTAGE OF UTERUS     KNEE ARTHROSCOPY Right 01/2016   KNEE ARTHROSCOPY Right 08/2016   LEFT HEART CATH AND CORONARY ANGIOGRAPHY N/A 04/14/2018   Procedure: LEFT HEART CATH AND CORONARY ANGIOGRAPHY;  Surgeon: Yvonne Kendall, MD;  Location: [facility];  Service: Cardiovascular;  Laterality: N/A;   live births  10/07/97, 02/08/2000   x2   ROBOTIC ASSISTED TOTAL HYSTERECTOMY WITH SALPINGECTOMY Bilateral 06/01/2017   Procedure: ROBOTIC ASSISTED TOTAL HYSTERECTOMY WITH SALPINGECTOMY WITH PARTIAL REMOVAL OF HERNIA SAC;  Surgeon: Carrington Clamp, MD;  Location: [facility];  Service: Gynecology;  Laterality: Bilateral;   TUBAL LIGATION Bilateral 03/20/2014   Procedure: BILATERAL TUBAL LIGATION;  Surgeon: Loney Laurence, MD;  Location: [facility];  Service: Obstetrics;  Laterality: Bilateral;   UNILATERAL SALPINGECTOMY  Right 03/20/2014   Procedure: UNILATERAL SALPINGECTOMY;  Surgeon: Loney Laurence, MD;  Location: [facility];  Service: Obstetrics;  Laterality: Right;    FAMILY HISTORY: Family History  Problem Relation Age of Onset   Heart attack Father    Hypertension Father    Hyperlipidemia Father    Asthma Son    Heart disease Maternal Grandfather    Cancer Paternal Grandmother        unsure what kind    SOCIAL HISTORY: Social History   Socioeconomic History   Marital status: Married    Spouse name: Not on file   Number of children: 3   Years of education: 2.5 yrs college   Highest education level: Not on file  Occupational History   Occupation: Production designer, theatre/television/film. Rep    Employer: [employer]  Tobacco Use   Smoking status: Never   Smokeless tobacco: Never  Vaping Use   Vaping Use: Never used  Substance and Sexual Activity   Alcohol use: No   Drug use: No   Sexual activity: Yes    Birth control/protection: Surgical  Other Topics Concern   Not on file  Social History Narrative   Lives at home with husband and three children.   Left-handed.   No caffeine use.       Social Determinants of Health   Financial Resource Strain: Not on file  Food Insecurity: Not on file  Transportation Needs: Not on file  Physical Activity: Not on file  Stress: Not on file  Social Connections: Not on file  Intimate Partner Violence: Not on file   PHYSICAL EXAM  Vitals:   08/11/22 1503  BP: 128/81  Pulse: 72  Weight: 300 lb (136.1 kg)  Height: 5\' 7"  (1.702 m)    Body mass index is 46.99 kg/m.  Generalized: Well developed, in no acute distress   Neurological examination  Mentation: Alert oriented to time, place, history taking. Follows all commands speech and language fluent Cranial nerve II-XII: Pupils were equal round reactive to light. Extraocular movements were full, visual field were full on confrontational test. Facial sensation and strength were normal.   Head turning and shoulder shrug  were normal and symmetric. Motor: The motor testing reveals 5 over 5 strength of all 4 extremities. Good symmetric motor tone is noted throughout.  Sensory: Sensory testing is intact to soft touch on all 4 extremities. No evidence  of extinction is noted.  Coordination: Cerebellar testing reveals good finger-nose-finger and heel-to-shin bilaterally.  Gait and station: Gait is normal.  Reflexes: Deep tendon reflexes are symmetric and normal bilaterally.   DIAGNOSTIC DATA (LABS, IMAGING, TESTING) - I reviewed patient records, labs, notes, testing and imaging myself where available.  Lab Results  Component Value Date   WBC 8.6 08/13/2021   HGB 14.1 08/13/2021   HCT 42.6 08/13/2021   MCV 90.3 08/13/2021   PLT 382 08/13/2021      Component Value Date/Time   NA 139 08/13/2021 1750   NA 138 04/26/2018 1204   K 5.0 08/13/2021 1750   CL 100 08/13/2021 1750   CO2 30 08/13/2021 1750   GLUCOSE 94 08/13/2021 1750   BUN 6 08/13/2021 1750   BUN 10 04/26/2018 1204   CREATININE 0.70 08/13/2021 1750   CALCIUM 9.4 08/13/2021 1750   PROT 6.3 (L) 08/13/2021 1750   ALBUMIN 3.7 08/13/2021 1750   AST 43 (H) 08/13/2021 1750   ALT 27 08/13/2021 1750   ALKPHOS 55 08/13/2021 1750   BILITOT 1.0 08/13/2021 1750   GFRNONAA >60 08/13/2021 1750   GFRAA 116 04/26/2018 1204   Lab Results  Component Value Date   CHOL 168 04/26/2018   HDL 40 04/26/2018   LDLCALC 87 04/26/2018   TRIG 205 (H) 04/26/2018   CHOLHDL 4.2 04/26/2018   Lab Results  Component Value Date   HGBA1C  06/03/2010    5.5 (NOTE)                                                                       According to the ADA Clinical Practice Recommendations for 2011, when HbA1c is used as a screening test:   >=6.5%   Diagnostic of Diabetes Mellitus           (if abnormal result  is confirmed)  5.7-6.4%   Increased risk of developing Diabetes Mellitus  References:Diagnosis and Classification of Diabetes  [place],Diabetes Care,2011,34(Suppl 1):S62-S69 and Standards of Medical Care in         Diabetes - 2011,Diabetes Care,2011,34  (Suppl 1):S11-S61.   No results found for: "VITAMINB12" No results found for: "TSH"  ASSESSMENT AND PLAN 51 y.o. year old female  has a past medical history of Allergic rhinitis, Anemia, Atypical chest pain (01/07/2012), Chronic migraine, Migraines, Miscarriage (08/1996, 12/1996), Normal labor (03/19/2014), PONV (postoperative nausea and vomiting), Postoperative state (03/20/2014), SVD (spontaneous vaginal delivery), and Vitamin D deficiency. here with:  1.  Chronic migraine headache -Migraines remain under good control -Continue atenolol 25 mg daily for migraine prevention -Continue Zomig as needed for acute headache, may combine with Zofran, Flexeril for prolonged headache  -Follow-up in 1 year for virtual visit Otila Kluver, DNP 08/11/2022, 3:22 PM [facility] [address], [address] [place], [place] [address] [phone]

## 2022-08-20 ENCOUNTER — Ambulatory Visit (HOSPITAL_COMMUNITY)
Admission: RE | Admit: 2022-08-20 | Discharge: 2022-08-20 | Disposition: A | Discharge status: Home / Self Care | Payer: 59 | Source: Ambulatory Visit | Attending: Thoracic Surgery (Cardiothoracic Vascular Surgery) | Admitting: Thoracic Surgery (Cardiothoracic Vascular Surgery)

## 2022-08-20 DIAGNOSIS — I712 Thoracic aortic aneurysm, without rupture, unspecified: Secondary | ICD-10-CM

## 2022-08-20 MED ORDER — IOHEXOL 350 MG/ML SOLN
75.0000 mL | Freq: Once | INTRAVENOUS | Status: AC | PRN
  Administered 2022-08-20: 75 mL via INTRAVENOUS

## 2022-09-02 ENCOUNTER — Ambulatory Visit (INDEPENDENT_AMBULATORY_CARE_PROVIDER_SITE_OTHER): Payer: 59 | Admitting: Thoracic Surgery (Cardiothoracic Vascular Surgery)

## 2022-09-02 ENCOUNTER — Ambulatory Visit: Payer: 59 | Admitting: Thoracic Surgery (Cardiothoracic Vascular Surgery)

## 2022-09-02 ENCOUNTER — Encounter: Payer: Self-pay | Admitting: Thoracic Surgery (Cardiothoracic Vascular Surgery)

## 2022-09-02 ENCOUNTER — Ambulatory Visit: Payer: 59 | Admitting: Neurology

## 2022-09-02 VITALS — BP 121/78 | HR 85 | Resp 20 | Ht 67.0 in | Wt 300.0 lb

## 2022-09-02 DIAGNOSIS — I712 Thoracic aortic aneurysm, without rupture, unspecified: Secondary | ICD-10-CM

## 2022-09-02 NOTE — Progress Notes (Signed)
[address]       [place] [address]             [phone]     HPI: Sheryl [name] returns for a scheduled follow-up regarding ascending aortic ectasia  Sheryl [name] is a 50 year old woman with a past medical history significant for obesity, atypical chest pain, migraines, and ascending aortic ectasia.  About a year ago she had an episode of atypical chest pain.  Her pain was quite severe and she had a CT of the chest.  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  On careful review of the films it appeared her aneurysm was about 3.8 cm.  She now returns for a 1 year follow-up.  She was evaluated by Dr. Eden Emms for her chest pain and it was not felt to be cardiac in origin.  In the interim since her last visit she has gained some weight.  She has had a couple of episodes of chest discomfort recently.  Past Medical History:  Diagnosis Date   Allergic rhinitis    Anemia    Atypical chest pain 01/07/2012   Ct chest 10/2011:  No acute process, no PE to midsized pulmonary vessels.     Chronic migraine    Migraines    Miscarriage 08/1996, 12/1996   x2   Normal labor 03/19/2014   PONV (postoperative nausea and vomiting)    Postoperative state 03/20/2014   SVD (spontaneous vaginal delivery)    x 2   Vitamin D deficiency     Current Outpatient Medications  Medication Sig Dispense Refill   acetaminophen (TYLENOL) 500 MG tablet Take 500-1,000 mg by mouth every 6 (six) hours as needed (for pain/headaches.).     albuterol (VENTOLIN HFA) 108 (90 Base) MCG/ACT inhaler 2 puffs     atenolol (TENORMIN) 25 MG tablet Take 1 tablet (25 mg total) by mouth daily. 90 tablet 4   budesonide-formoterol (SYMBICORT) 80-4.5 MCG/ACT inhaler Symbicort 80 mcg-4.5 mcg/actuation HFA aerosol inhaler  Inhale 2 puffs twice a day by inhalation route.     cetirizine (ZYRTEC) 10 MG tablet Take 10 mg by mouth daily as needed for allergies ((hay fever/pollen)).      cyclobenzaprine (FLEXERIL) 5 MG  tablet Take 1 tablet (5 mg total) by mouth every 8 (eight) hours as needed for muscle spasms. 30 tablet 1   Multiple Vitamin (MULTIVITAMIN WITH MINERALS) TABS tablet Take 1 tablet by mouth daily.     ondansetron (ZOFRAN ODT) 4 MG disintegrating tablet Take 1 tablet (4 mg total) by mouth every 8 (eight) hours as needed. 20 tablet 3   zolmitriptan (ZOMIG) 5 MG tablet TAKE 1 TABLET BY MOUTH AT ONSET OF MIGRAINE. MAY REPEAT IN 2 HOURS IF NEEDED. MAX 2 IN 24 HOURS 12 tablet 11   dicyclomine (BENTYL) 20 MG tablet Take 1 tablet (20 mg total) by mouth 2 (two) times daily. 60 tablet 0   No current facility-administered medications for this visit.    Physical Exam BP 121/78   Pulse 85   Resp 20 Comment: RA  Ht 5\' 7"  (1.702 m)   Wt 300 lb (136.1 kg)   LMP 05/26/2017 (Exact Date)   SpO2 95%   BMI 46.13 kg/m  51 year old woman in no acute distress Alert and oriented x 3 with no focal deficits Lungs clear bilaterally Cardiac regular rate and rhythm with normal S1 and S2, no murmur  Diagnostic Tests: CT ANGIOGRAPHY CHEST WITH CONTRAST  TECHNIQUE: Multidetector CT imaging of the chest was performed using the standard protocol during bolus administration of intravenous contrast. Multiplanar CT image reconstructions and MIPs were obtained to evaluate the vascular anatomy.   RADIATION DOSE REDUCTION: This exam was performed according to the departmental dose-optimization program which includes automated exposure control, adjustment of the mA and/or kV according to patient size and/or use of iterative reconstruction technique.   CONTRAST:  20mL OMNIPAQUE IOHEXOL 350 MG/ML SOLN   COMPARISON:  06/14/2021 and previous   FINDINGS: Cardiovascular: Heart size normal. No pericardial effusion. Fair contrast opacification of pulmonary arterial tree with no convincing filling defects to suggest acute PE. Good contrast opacification of the thoracic aorta without dissection or stenosis.  Patent brachiocephalic arterial origin anatomy. The left vertebral artery arises directly from the arch, an anatomic variant.   Aortic Root:   --Valve: 2.4 cm   --Sinuses: 3.6 cm   --Sinotubular Junction: 3 cm   Limitations by motion: Mild   Thoracic Aorta:   --Ascending Aorta: 3.8 cm (previous reported measurement of 4.1 cm probably measured on obliquity)   --Aortic Arch: 2.8 cm   --Descending Aorta: 2.3 cm   Mediastinum/Nodes: No mass or adenopathy.   Lungs/Pleura: No pleural effusion. No pneumothorax. Lungs are clear.   Upper Abdomen: No acute findings.   Musculoskeletal: No chest wall abnormality. No acute or significant osseous findings.   Review of the MIP images confirms the above findings.   IMPRESSION: 1. No acute findings. 2. 3.8 cm ascending thoracic aortic diameter, previously reported measurement of 4.1 cm probably measured on obliquity.     Electronically Signed   By: Corlis Leak M.D.   On: 08/22/2022 11:36 I personally reviewed the CT images.  Ascending aorta ectatic with a diameter of 3.8 cm.  Impression: Sheryl [name] is a 51 year old woman with a past medical history significant for obesity, atypical chest pain, migraines, and ascending aortic ectasia.  Ascending aortic ectasia-stable at 3.8 cm.  Will check again in a year.  If it remains stable we may go to checking every couple of years.  No history of hypertension and blood pressure is well-controlled.  Obesity-importance of weight loss emphasized.  Plan: Return in 1 year with CT angio of chest  Loreli Slot, MD [facility] [phone]

## 2022-09-18 NOTE — Progress Notes (Deleted)
CARDIOLOGY CONSULT NOTE       Patient ID: Sheryl [name] MRN: [id number] DOB/AGE: 1971-07-06 52 y.o.  Admit date: (Not on file) Referring Physician: Moreen Fowler Primary Physician: Harlan Stains, MD Primary Cardiologist: Johnsie Cancel Reason for Consultation: Aortic Aneurysm/Dyspnea    HPI:  52 y.o. referred by Dr Moreen Fowler for aortic disease and dyspnea. She was seen by cardiology in 2019 had an abnormal myovue but cath 04/14/18 showed no significant CAD. CTA done 06/14/21 for chest pain,dizziness dyspnea and weakness showed no PE and 4.1 cm ascending thoracic aortic aneurysm She was set up to see Dr Roxan Hockey CVTS   Seen in ER 06/14/21 with atypical chest pain sharp left sided to arm resolved after 15 minutes ? Associated tunnel vision and near syncope Chronic LE edema with negative [place] for DVT in 2021 She r/o and had no acute ECG changes  She is still working for [employer] from home has son Ovid Curd at home with her he is 8  Most recent CTA 08/22/22 showed aorta only 3.8 cm when measured orhtogonaly and not obliquely   ***   ROS All other systems reviewed and negative except as noted above  Past Medical History:  Diagnosis Date   Allergic rhinitis    Anemia    Atypical chest pain 01/07/2012   Ct chest 10/2011:  No acute process, no PE to midsized pulmonary vessels.     Chronic migraine    Migraines    Miscarriage 08/1996, 12/1996   x2   Normal labor 03/19/2014   PONV (postoperative nausea and vomiting)    Postoperative state 03/20/2014   SVD (spontaneous vaginal delivery)    x 2   Vitamin D deficiency     Family History  Problem Relation Age of Onset   Heart attack Father    Hypertension Father    Hyperlipidemia Father    Asthma Son    Heart disease Maternal Grandfather    Cancer Paternal Grandmother        unsure what kind    Social History   Socioeconomic History   Marital status: Married    Spouse name: Not on file   Number of children: 3   Years of education: 2.5 yrs college    Highest education level: Not on file  Occupational History   Occupation: Electronics engineer. Rep    Employer: [employer]  Tobacco Use   Smoking status: Never   Smokeless tobacco: Never  Vaping Use   Vaping Use: Never used  Substance and Sexual Activity   Alcohol use: No   Drug use: No   Sexual activity: Yes    Birth control/protection: Surgical  Other Topics Concern   Not on file  Social History Narrative   Lives at home with husband and three children.   Left-handed.   No caffeine use.       Social Determinants of Health   Financial Resource Strain: Not on file  Food Insecurity: Not on file  Transportation Needs: Not on file  Physical Activity: Not on file  Stress: Not on file  Social Connections: Not on file  Intimate Partner Violence: Not on file    Past Surgical History:  Procedure Laterality Date   ABDOMINAL HYSTERECTOMY     BREAST SURGERY     cyst removed from left breast; benign   CESAREAN SECTION N/A 03/20/2014   Procedure: CESAREAN SECTION;  Surgeon: Daria Pastures, MD;  Location: [facility];  Service: Obstetrics;  Laterality: N/A;   cyst removed from  L breast  10/2006   benign   CYSTOSCOPY N/A 06/01/2017   Procedure: CYSTOSCOPY;  Surgeon: Bobbye Charleston, MD;  Location: [facility];  Service: Gynecology;  Laterality: N/A;   DILATION AND CURETTAGE OF UTERUS  08/1996, 12/1996   x 2 MAB   DILATION AND CURETTAGE OF UTERUS     KNEE ARTHROSCOPY Right 01/2016   KNEE ARTHROSCOPY Right 08/2016   LEFT HEART CATH AND CORONARY ANGIOGRAPHY N/A 04/14/2018   Procedure: LEFT HEART CATH AND CORONARY ANGIOGRAPHY;  Surgeon: Nelva Bush, MD;  Location: [facility];  Service: Cardiovascular;  Laterality: N/A;   live births  10/07/97, 02/08/2000   x2   ROBOTIC ASSISTED TOTAL HYSTERECTOMY WITH SALPINGECTOMY Bilateral 06/01/2017   Procedure: ROBOTIC ASSISTED TOTAL HYSTERECTOMY WITH SALPINGECTOMY WITH PARTIAL REMOVAL OF HERNIA [place];  Surgeon: Bobbye Charleston,  MD;  Location: [facility];  Service: Gynecology;  Laterality: Bilateral;   TUBAL LIGATION Bilateral 03/20/2014   Procedure: BILATERAL TUBAL LIGATION;  Surgeon: Daria Pastures, MD;  Location: [facility];  Service: Obstetrics;  Laterality: Bilateral;   UNILATERAL SALPINGECTOMY Right 03/20/2014   Procedure: UNILATERAL SALPINGECTOMY;  Surgeon: Daria Pastures, MD;  Location: [facility];  Service: Obstetrics;  Laterality: Right;      Current Outpatient Medications:    acetaminophen (TYLENOL) 500 MG tablet, Take 500-1,000 mg by mouth every 6 (six) hours as needed (for pain/headaches.)., Disp: , Rfl:    albuterol (VENTOLIN HFA) 108 (90 Base) MCG/ACT inhaler, 2 puffs, Disp: , Rfl:    atenolol (TENORMIN) 25 MG tablet, Take 1 tablet (25 mg total) by mouth daily., Disp: 90 tablet, Rfl: 4   budesonide-formoterol (SYMBICORT) 80-4.5 MCG/ACT inhaler, Symbicort 80 mcg-4.5 mcg/actuation HFA aerosol inhaler  Inhale 2 puffs twice a day by inhalation route., Disp: , Rfl:    cetirizine (ZYRTEC) 10 MG tablet, Take 10 mg by mouth daily as needed for allergies ((hay fever/pollen)). , Disp: , Rfl:    cyclobenzaprine (FLEXERIL) 5 MG tablet, Take 1 tablet (5 mg total) by mouth every 8 (eight) hours as needed for muscle spasms., Disp: 30 tablet, Rfl: 1   dicyclomine (BENTYL) 20 MG tablet, Take 1 tablet (20 mg total) by mouth 2 (two) times daily., Disp: 60 tablet, Rfl: 0   Multiple Vitamin (MULTIVITAMIN WITH MINERALS) TABS tablet, Take 1 tablet by mouth daily., Disp: , Rfl:    ondansetron (ZOFRAN ODT) 4 MG disintegrating tablet, Take 1 tablet (4 mg total) by mouth every 8 (eight) hours as needed., Disp: 20 tablet, Rfl: 3   zolmitriptan (ZOMIG) 5 MG tablet, TAKE 1 TABLET BY MOUTH AT ONSET OF MIGRAINE. MAY REPEAT IN 2 HOURS IF NEEDED. MAX 2 IN 24 HOURS, Disp: 12 tablet, Rfl: 11    Physical Exam: Last menstrual period 05/26/2017, unknown if currently breastfeeding.    Affect appropriate Healthy:  appears stated age 52:  normal Neck supple with no adenopathy JVP normal no bruits no thyromegaly Lungs clear with no wheezing and good diaphragmatic motion Heart:  S1/S2 no murmur, no rub, gallop or click PMI normal Abdomen: benighn, BS positve, no tenderness, no AAA no bruit.  No HSM or HJR Distal pulses intact with no bruits No edema Neuro non-focal Skin warm and dry No muscular weakness   Labs:   Lab Results  Component Value Date   WBC 8.6 08/13/2021   HGB 14.1 08/13/2021   HCT 42.6 08/13/2021   MCV 90.3 08/13/2021   PLT 382 08/13/2021   No results for input(s): "NA", "K", "CL", "CO2", "  BUN", "CREATININE", "CALCIUM", "PROT", "BILITOT", "ALKPHOS", "ALT", "AST", "GLUCOSE" in the last 168 hours.  Invalid input(s): "LABALBU" Lab Results  Component Value Date   CKTOTAL 61 06/03/2010   CKMB 0.7 06/03/2010   TROPONINI 0.01        NO INDICATION OF MYOCARDIAL INJURY. 06/03/2010    Lab Results  Component Value Date   CHOL 168 04/26/2018   CHOL  06/03/2010    135        ATP III CLASSIFICATION:  <200     mg/dL   Desirable  200-239  mg/dL   Borderline High  >=240    mg/dL   High          Lab Results  Component Value Date   HDL 40 04/26/2018   HDL 47 06/03/2010   Lab Results  Component Value Date   LDLCALC 87 04/26/2018   [place]  06/03/2010    49        Total Cholesterol/HDL:CHD Risk Coronary Heart Disease Risk Table                     Men   Women  1/2 Average Risk   3.4   3.3  Average Risk       5.0   4.4  2 X Average Risk   9.6   7.1  3 X Average Risk  23.4   11.0        Use the calculated Patient Ratio above and the CHD Risk Table to determine the patient's CHD Risk.        ATP III CLASSIFICATION (LDL):  <100     mg/dL   Optimal  100-129  mg/dL   Near or Above                    Optimal  130-159  mg/dL   Borderline  160-189  mg/dL   High  >190     mg/dL   Very High   Lab Results  Component Value Date   TRIG 205 (H) 04/26/2018   TRIG 194 (H) 06/03/2010   Lab Results   Component Value Date   CHOLHDL 4.2 04/26/2018   CHOLHDL 2.9 06/03/2010   No results found for: "LDLDIRECT"    Radiology: CT ANGIO CHEST AORTA W/ & OR WO/CM & GATING ([facility] ONLY)  Result Date: 08/22/2022 CLINICAL DATA:  Thoracic aortic disease, preop planning EXAM: CT ANGIOGRAPHY CHEST WITH CONTRAST TECHNIQUE: Multidetector CT imaging of the chest was performed using the standard protocol during bolus administration of intravenous contrast. Multiplanar CT image reconstructions and MIPs were obtained to evaluate the vascular anatomy. RADIATION DOSE REDUCTION: This exam was performed according to the departmental dose-optimization program which includes automated exposure control, adjustment of the mA and/or kV according to patient size and/or use of iterative reconstruction technique. CONTRAST:  67m OMNIPAQUE IOHEXOL 350 MG/ML SOLN COMPARISON:  06/14/2021 and previous FINDINGS: Cardiovascular: Heart size normal. No pericardial effusion. Fair contrast opacification of pulmonary arterial tree with no convincing filling defects to suggest acute PE. Good contrast opacification of the thoracic aorta without dissection or stenosis. Patent brachiocephalic arterial origin anatomy. The left vertebral artery arises directly from the arch, an anatomic variant. Aortic Root: --Valve: 2.4 cm --Sinuses: 3.6 cm --Sinotubular Junction: 3 cm Limitations by motion: Mild Thoracic Aorta: --Ascending Aorta: 3.8 cm (previous reported measurement of 4.1 cm probably measured on obliquity) --Aortic Arch: 2.8 cm --Descending Aorta: 2.3 cm Mediastinum/Nodes: No mass or adenopathy. Lungs/Pleura: No pleural  effusion. No pneumothorax. Lungs are clear. Upper Abdomen: No acute findings. Musculoskeletal: No chest wall abnormality. No acute or significant osseous findings. Review of the MIP images confirms the above findings. IMPRESSION: 1. No acute findings. 2. 3.8 cm ascending thoracic aortic diameter, previously reported  measurement of 4.1 cm probably measured on obliquity. Electronically Signed   By: Lucrezia Europe M.D.   On: 08/22/2022 11:36    EKG: NSR poor R wave progression normal ST;s 06/15/21   ASSESSMENT AND PLAN:   Chest Pain: atypical r/ in ER 06/15/21 previous normal cath in 2019 Do not think acute ischemic testing needed. Unfortunately her most recent CTA 08/22/22 was not done as a gated cardiac CTA  Aortic Aneurysm :  F/U Roxan Hockey see above regarding CTA continue atenolol With aorta only 3.8 cm would think she can be imaged again in 2-3 years   F/U in a year   Signed: Jenkins Rouge 09/18/2022, 10:37 AM

## 2022-09-29 ENCOUNTER — Ambulatory Visit: Payer: 59 | Admitting: Cardiovascular Disease

## 2022-10-06 IMAGING — MR MR KNEE*L* W/O CM
4 of 6 series · 23 of 40 positions shown · non-contrast
Comparison: None.

CLINICAL DATA: Left knee pain and swelling, injury 1 month ago

EXAM:
MRI OF THE LEFT KNEE WITHOUT CONTRAST
TECHNIQUE: Multiplanar, multisequence MR imaging of the knee was performed. No
intravenous contrast was administered.

[Series 4: T2 fat-sat · coronal · 4.0mm · 0.59mm/px · 6 of 29 slices shown (1 of 2)]
[im 1/29]
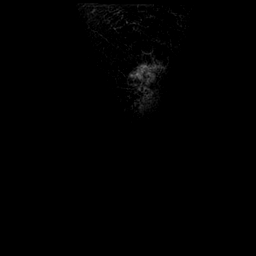
[im 6/29]
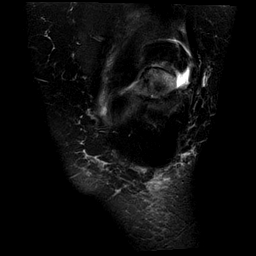
[im 12/29]
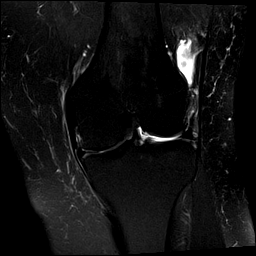
[im 17/29]
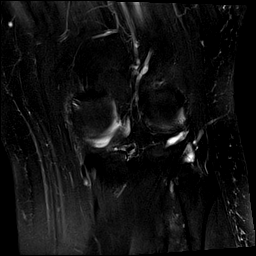
[im 23/29]
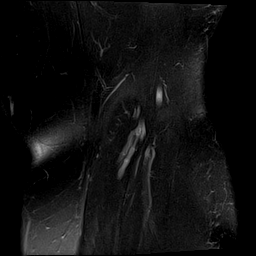
[im 29/29]
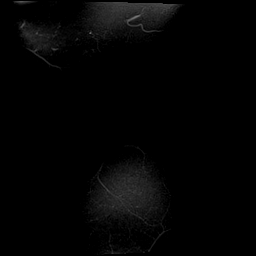

[Series 5: T1 · coronal · 4.0mm · 0.29mm/px · 3 of 29 slices shown]
[im 6/29]
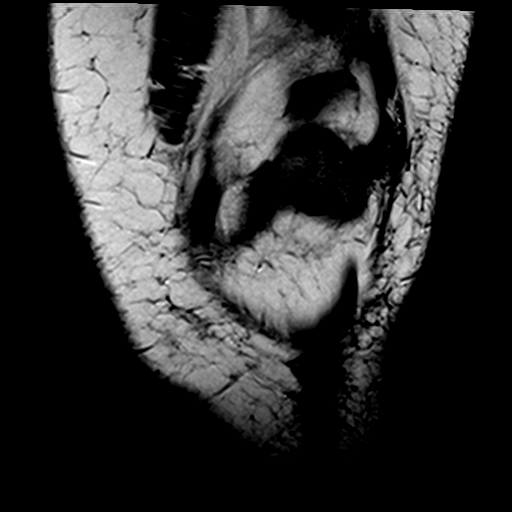
[im 17/29]
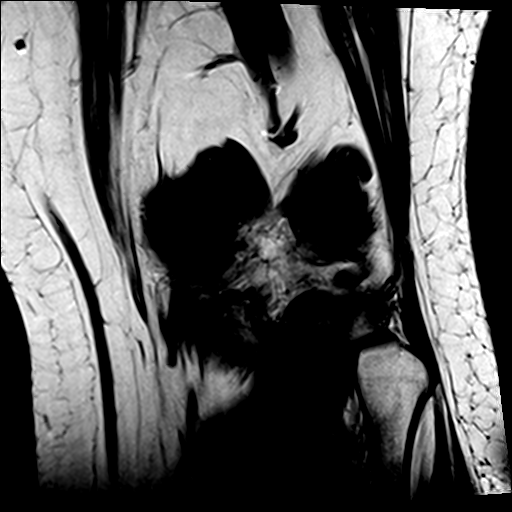
[im 29/29]
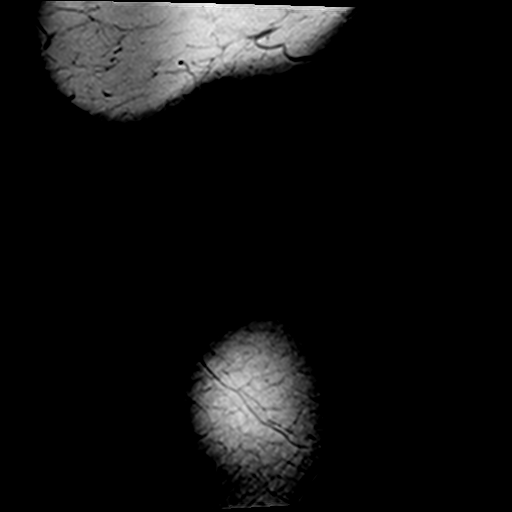

[Series 7: PD fat-sat · sagittal · 3.0mm · 0.29mm/px · 7 of 30 slices shown]
[im 1/30]
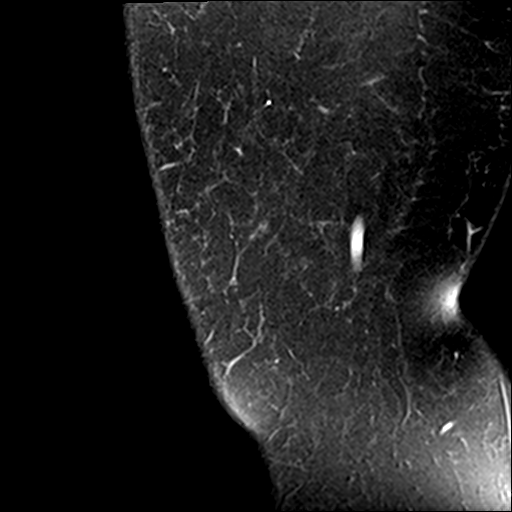
[im 5/30]
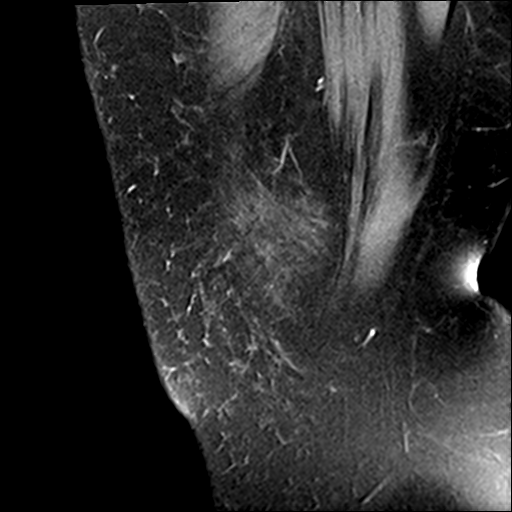
[im 10/30]
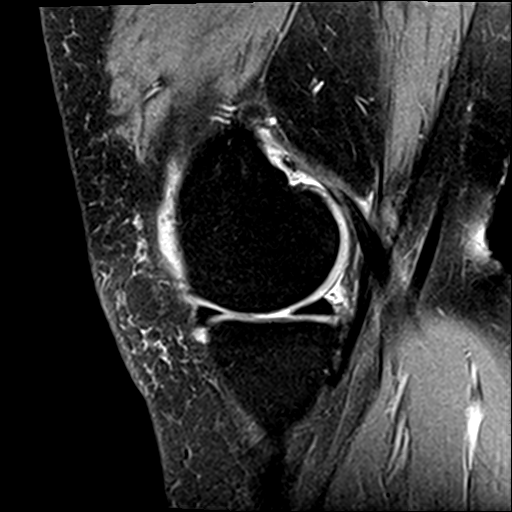
[im 15/30]
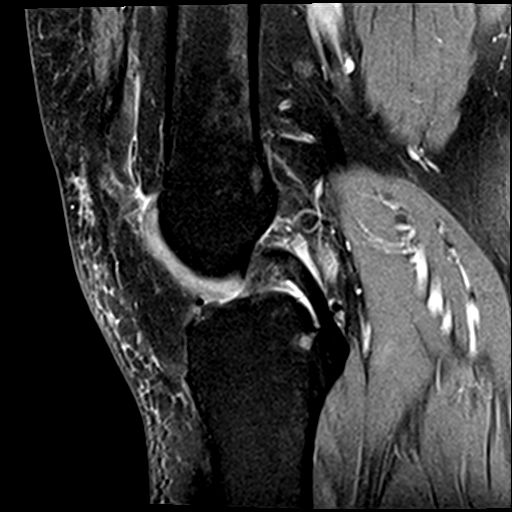
[im 20/30]
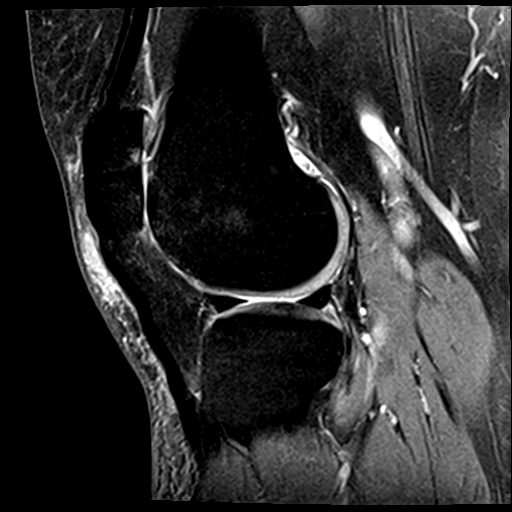
[im 25/30]
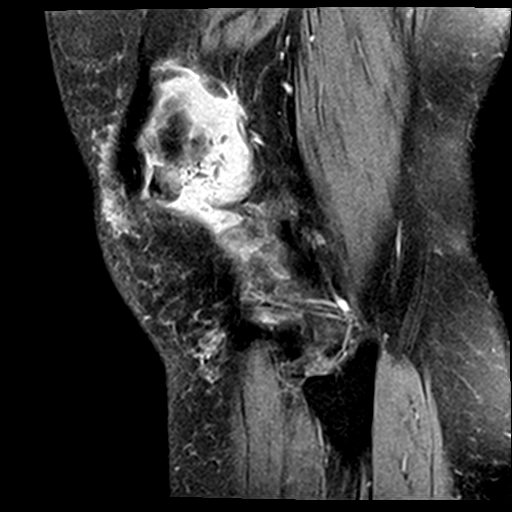
[im 30/30]
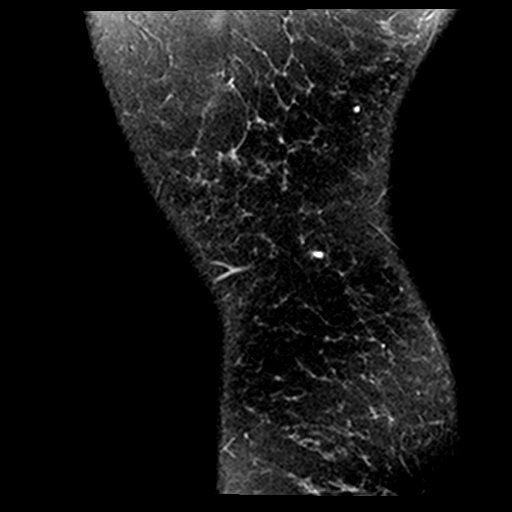

[Series 8: T2 fat-sat · sagittal · 3.0mm · 0.29mm/px · 7 of 30 slices shown (2 of 2)]
[im 1/30]
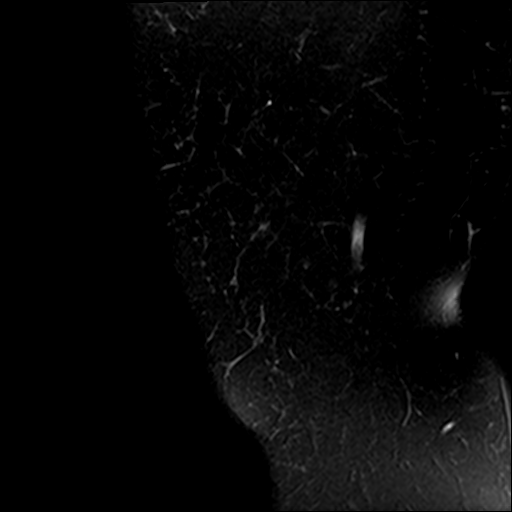
[im 5/30]
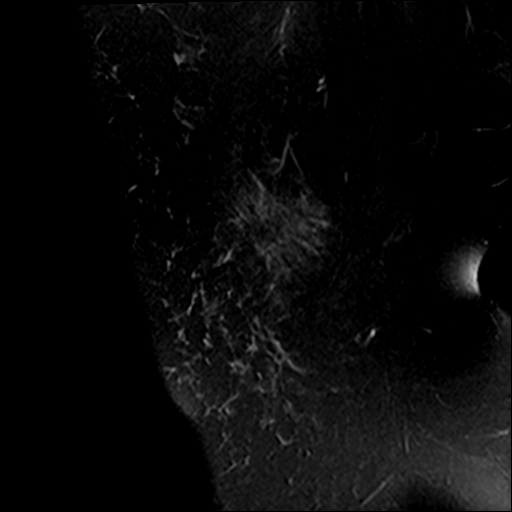
[im 10/30]
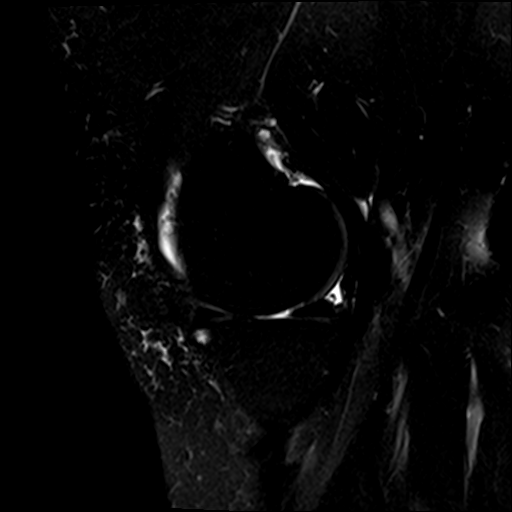
[im 15/30]
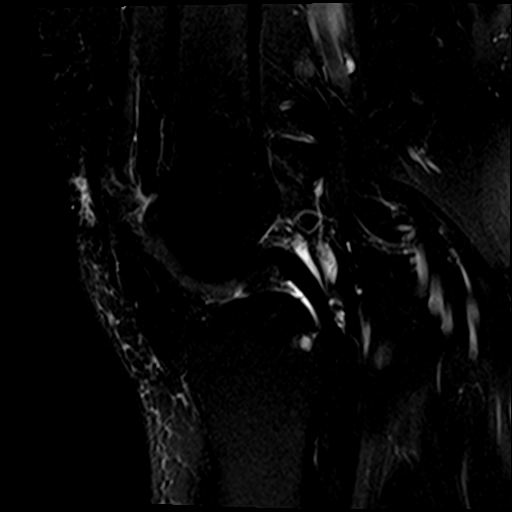
[im 20/30]
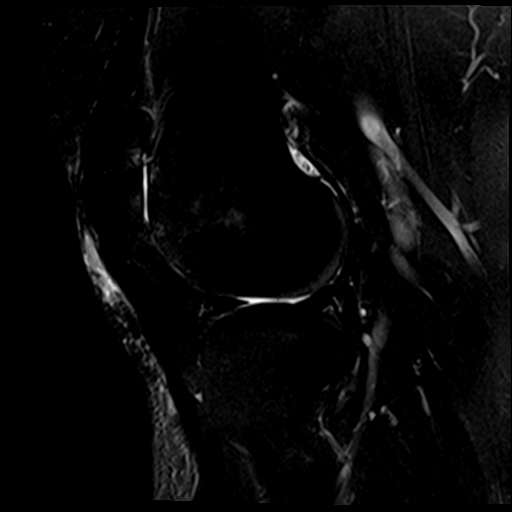
[im 25/30]
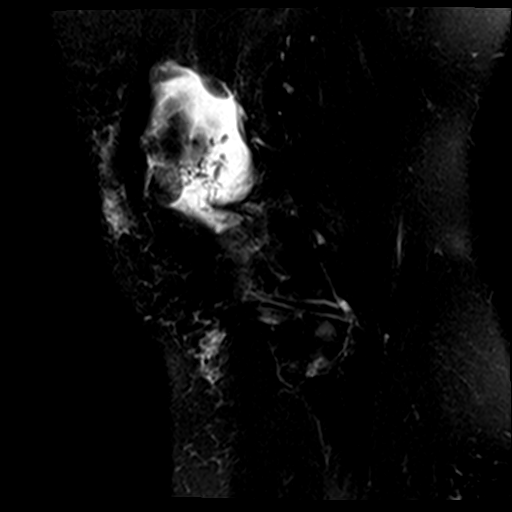
[im 30/30]
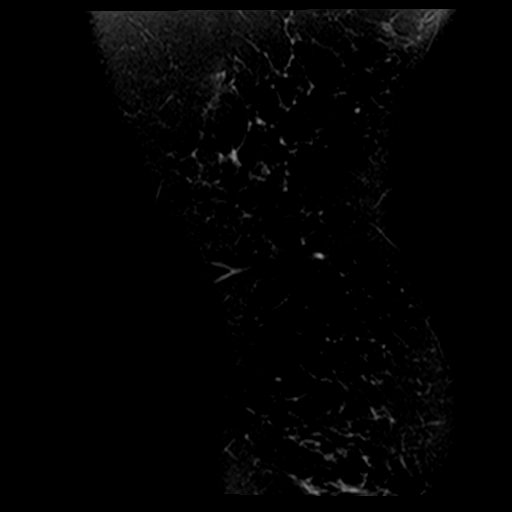

[23 of 40 positions shown; findings below may reference images not displayed]

FINDINGS: MENISCI

Medial meniscus:  Unremarkable

Lateral meniscus: Minimal free edge fraying of the midbody without
overt tear.

LIGAMENTS

Cruciates:  Unremarkable

Collaterals: Mild edema tracks adjacent to the MCL. This can be
incidental but in the appropriate clinical circumstance could
represent grade 1 sprain.

CARTILAGE

Patellofemoral: Full-thickness loss of articular cartilage laterally
in the patellofemoral joint with associated subcortical marrow
edema. Moderate chondral thinning medially in the patellofemoral
joint. Marginal spurring.

Medial: Irregular nearly full-thickness chondral defects centrally
and posteriorly along the medial femoral condyle for example on
images 15-18 of series 4. Otherwise mild chondral thinning.

Lateral:  Unremarkable

Joint:  Small knee effusion.  Mild synovitis.

Popliteal Fossa:  Small Baker's cyst.

Extensor Mechanism:  Mild prepatellar subcutaneous edema.

Bones:  Small geode along the tibial spine.

Other: No supplemental non-categorized findings.
IMPRESSION: 1. Full-thickness loss of articular cartilage laterally in the
patellofemoral joint with associated subcortical marrow edema.
Moderate chondral thinning medially in the patellofemoral joint.
2. Irregular nearly full-thickness chondral defects centrally and
posteriorly along the medial femoral condyle.
3. Small knee effusion with mild synovitis and small Baker's cyst.
4. Minimal free edge fraying of the midbody lateral meniscus without
overt tear.
5. Mild edema tracks adjacent to the MCL. This can be incidental but
in the appropriate clinical circumstance could represent grade 1
sprain.

## 2023-01-14 ENCOUNTER — Encounter: Payer: Self-pay | Admitting: Plastic Surgery

## 2023-01-14 ENCOUNTER — Ambulatory Visit (INDEPENDENT_AMBULATORY_CARE_PROVIDER_SITE_OTHER): Payer: 59 | Admitting: Plastic Surgery

## 2023-01-14 VITALS — BP 131/91 | HR 72 | Ht 67.0 in | Wt 296.0 lb

## 2023-01-14 DIAGNOSIS — N62 Hypertrophy of breast: Secondary | ICD-10-CM | POA: Diagnosis not present

## 2023-01-14 DIAGNOSIS — R21 Rash and other nonspecific skin eruption: Secondary | ICD-10-CM

## 2023-01-14 DIAGNOSIS — M542 Cervicalgia: Secondary | ICD-10-CM | POA: Insufficient documentation

## 2023-01-14 DIAGNOSIS — Z803 Family history of malignant neoplasm of breast: Secondary | ICD-10-CM

## 2023-01-14 DIAGNOSIS — M545 Low back pain, unspecified: Secondary | ICD-10-CM

## 2023-01-14 DIAGNOSIS — Z6841 Body Mass Index (BMI) 40.0 and over, adult: Secondary | ICD-10-CM

## 2023-01-14 DIAGNOSIS — M546 Pain in thoracic spine: Secondary | ICD-10-CM

## 2023-01-14 DIAGNOSIS — G8929 Other chronic pain: Secondary | ICD-10-CM

## 2023-01-14 DIAGNOSIS — M549 Dorsalgia, unspecified: Secondary | ICD-10-CM | POA: Insufficient documentation

## 2023-01-14 NOTE — Progress Notes (Signed)
Patient ID: Sheryl [name], female    DOB: 1971/05/19, 52 y.o.   MRN: [id number]   Chief Complaint  Patient presents with   Advice Only   Breast Problem    Mammary Hyperplasia: The patient is a 52 y.o. female with a history of mammary hyperplasia for several years.  She has extremely large breasts causing symptoms that include the following: Back pain in the upper and lower back, including neck pain. She pulls or pins her bra straps to provide better lift and relief of the pressure and pain. She notices relief by holding her breast up manually.  Her shoulder straps cause grooves and pain and pressure that requires padding for relief. Pain medication is sometimes required with motrin and tylenol.  Activities that are hindered by enlarged breasts include: exercise and running.  She has tried supportive clothing as well as fitted bras without improvement.  Her breasts are extremely large and fairly symmetric.  She has hyperpigmentation of the inframammary area on both sides.  The sternal to nipple distance on the right is 39 cm and the left is 40 cm.  The IMF distance is 20 cm.  She is 5 feet 7 inches tall and weighs 296 pounds.  The BMI = 46.4 kg/m.  Preoperative bra size = 44 DDD cup. She would like to be around a D cup.  The estimated excess breast tissue to be removed at the time of surgery = 1150 grams on the left and 1150 grams on the right.  Mammogram history: April 2024 - negative.  Family history of breast cancer:  paternal grandmother.  Tobacco use:  none.   The patient expresses the desire to pursue surgical intervention.  The patient had a breast biopsy in 2008.  It was negative.  She does not have diabetes and her last hemoglobin A1c was 5.9.  She does get frequent migraines but is able to control them well with medication.     Review of Systems  Constitutional: Negative.   HENT: Negative.    Eyes: Negative.   Respiratory: Negative.  Negative for chest tightness and shortness of  breath.   Cardiovascular: Negative.  Negative for leg swelling.  Gastrointestinal: Negative.   Endocrine: Negative.   Genitourinary: Negative.   Musculoskeletal:  Positive for back pain and neck pain.  Skin:  Positive for rash.    Past Medical History:  Diagnosis Date   Allergic rhinitis    Anemia    Atypical chest pain 01/07/2012   Ct chest 10/2011:  No acute process, no PE to midsized pulmonary vessels.     Chronic migraine    Migraines    Miscarriage 08/1996, 12/1996   x2   Normal labor 03/19/2014   PONV (postoperative nausea and vomiting)    Postoperative state 03/20/2014   SVD (spontaneous vaginal delivery)    x 2   Vitamin D deficiency     Past Surgical History:  Procedure Laterality Date   ABDOMINAL HYSTERECTOMY     BREAST SURGERY     cyst removed from left breast; benign   CESAREAN SECTION N/A 03/20/2014   Procedure: CESAREAN SECTION;  Surgeon: Loney Laurence, MD;  Location: [facility];  Service: Obstetrics;  Laterality: N/A;   cyst removed from L breast  10/2006   benign   CYSTOSCOPY N/A 06/01/2017   Procedure: CYSTOSCOPY;  Surgeon: Carrington Clamp, MD;  Location: [facility];  Service: Gynecology;  Laterality: N/A;   DILATION AND CURETTAGE OF UTERUS  08/1996,  12/1996   x 2 MAB   DILATION AND CURETTAGE OF UTERUS     KNEE ARTHROSCOPY Right 01/2016   KNEE ARTHROSCOPY Right 08/2016   LEFT HEART CATH AND CORONARY ANGIOGRAPHY N/A 04/14/2018   Procedure: LEFT HEART CATH AND CORONARY ANGIOGRAPHY;  Surgeon: Yvonne Kendall, MD;  Location: [facility];  Service: Cardiovascular;  Laterality: N/A;   live births  10/07/97, 02/08/2000   x2   ROBOTIC ASSISTED TOTAL HYSTERECTOMY WITH SALPINGECTOMY Bilateral 06/01/2017   Procedure: ROBOTIC ASSISTED TOTAL HYSTERECTOMY WITH SALPINGECTOMY WITH PARTIAL REMOVAL OF HERNIA [place];  Surgeon: Carrington Clamp, MD;  Location: [facility];  Service: Gynecology;  Laterality: Bilateral;   TUBAL LIGATION Bilateral 03/20/2014   Procedure: BILATERAL TUBAL  LIGATION;  Surgeon: Loney Laurence, MD;  Location: [facility];  Service: Obstetrics;  Laterality: Bilateral;   UNILATERAL SALPINGECTOMY Right 03/20/2014   Procedure: UNILATERAL SALPINGECTOMY;  Surgeon: Loney Laurence, MD;  Location: [facility];  Service: Obstetrics;  Laterality: Right;      Current Outpatient Medications:    acetaminophen (TYLENOL) 500 MG tablet, Take 500-1,000 mg by mouth every 6 (six) hours as needed (for pain/headaches.)., Disp: , Rfl:    albuterol (VENTOLIN HFA) 108 (90 Base) MCG/ACT inhaler, 2 puffs, Disp: , Rfl:    atenolol (TENORMIN) 25 MG tablet, Take 1 tablet (25 mg total) by mouth daily., Disp: 90 tablet, Rfl: 4   budesonide (PULMICORT) 0.5 MG/2ML nebulizer solution, as needed., Disp: , Rfl:    budesonide-formoterol (SYMBICORT) 80-4.5 MCG/ACT inhaler, Symbicort 80 mcg-4.5 mcg/actuation HFA aerosol inhaler  Inhale 2 puffs twice a day by inhalation route., Disp: , Rfl:    budesonide-formoterol (SYMBICORT) 80-4.5 MCG/ACT inhaler, 2 puffs Inhalation twice a day, Disp: , Rfl:    cetirizine (ZYRTEC) 10 MG tablet, Take 10 mg by mouth daily as needed for allergies ((hay fever/pollen)). , Disp: , Rfl:    cholecalciferol (VITAMIN D3) 25 MCG (1000 UNIT) tablet, Take by mouth., Disp: , Rfl:    cyclobenzaprine (FLEXERIL) 5 MG tablet, Take 1 tablet (5 mg total) by mouth every 8 (eight) hours as needed for muscle spasms., Disp: 30 tablet, Rfl: 1   eletriptan (RELPAX) 40 MG tablet, TAKE 1 TABLET BY MOUTH FOR MIGRAINE MAY REPEAT IN 2 HOURS NO MORE THAN 2/24 AND MAX 2-3/WEEK, Disp: , Rfl:    Multiple Vitamin (MULTIVITAMIN WITH MINERALS) TABS tablet, Take 1 tablet by mouth daily., Disp: , Rfl:    ondansetron (ZOFRAN ODT) 4 MG disintegrating tablet, Take 1 tablet (4 mg total) by mouth every 8 (eight) hours as needed., Disp: 20 tablet, Rfl: 3   zolmitriptan (ZOMIG) 5 MG tablet, TAKE 1 TABLET BY MOUTH AT ONSET OF MIGRAINE. MAY REPEAT IN 2 HOURS IF NEEDED. MAX 2 IN 24 HOURS, Disp: 12 tablet,  Rfl: 11   Objective:   Vitals:   01/14/23 1319  BP: (!) 131/91  Pulse: 72  SpO2: 98%    Physical Exam Vitals and nursing note reviewed.  Constitutional:      Appearance: Normal appearance.  HENT:     Head: Normocephalic and atraumatic.  Cardiovascular:     Rate and Rhythm: Normal rate.     Pulses: Normal pulses.  Pulmonary:     Effort: Pulmonary effort is normal.  Abdominal:     General: There is no distension.     Palpations: Abdomen is soft. There is no mass.  Skin:    General: Skin is warm.     Capillary Refill: Capillary refill takes less  than 2 seconds.     Coloration: Skin is not jaundiced.     Findings: No erythema.  Neurological:     Mental Status: She is alert and oriented to person, place, and time.  Psychiatric:        Mood and Affect: Mood normal.        Behavior: Behavior normal.        Thought Content: Thought content normal.        Judgment: Judgment normal.     Assessment & Plan:  Symptomatic mammary hypertrophy  Chronic bilateral thoracic back pain  Neck pain  The procedure the patient selected and that was best for the patient was discussed. The risk were discussed and include but not limited to the following:  Breast asymmetry, fluid accumulation, firmness of the breast, inability to breast feed, loss of nipple or areola, skin loss, change in skin and nipple sensation, fat necrosis of the breast tissue, bleeding, infection and healing delay.  There are risks of anesthesia and injury to nerves or blood vessels.  Allergic reaction to tape, suture and skin glue are possible.  There will be swelling.  Any of these can lead to the need for revisional surgery which is not included in this surgery.  A breast reduction has potential to interfere with diagnostic procedures in the future.  This procedure is best done when the breast is fully developed.  Changes in the breast will continue to occur over time: pregnancy, weight gain or weigh loss. No guarantees  are given for a certain bra or breast size.    Total time: 30 minutes. This includes time spent with the patient during the visit as well as time spent before and after the visit reviewing the chart, documenting the encounter, ordering pertinent studies and literature for the patient.    The patient is a good candidate for healthy weight and wellness.  I also encouraged her to try the 10-day sugar-free challenge.  Would like her to lose some weight if possible as we are getting this approved.  Overall this will help with her healing.  She is a good candidate for bilateral breast reduction with possible liposuction and we can submit this for preauthorization.  Pictures were obtained of the patient and placed in the chart with the patient's or guardian's permission.   Alena Bills Desha Bitner, DO

## 2023-01-14 NOTE — Addendum Note (Signed)
Addended by: Angelita Ingles on: 01/14/2023 03:24 PM   Modules accepted: Orders

## 2023-01-17 ENCOUNTER — Telehealth: Payer: Self-pay

## 2023-01-17 NOTE — Telephone Encounter (Signed)
Faxed release of medical records form to [facility] to request mammogram results for Dr. Kittie Plater review. Fax success received. Forwarding original document to front desk for batch scanning.

## 2023-02-03 ENCOUNTER — Emergency Department (HOSPITAL_COMMUNITY): Payer: 59

## 2023-02-03 ENCOUNTER — Other Ambulatory Visit: Payer: Self-pay

## 2023-02-03 ENCOUNTER — Emergency Department (HOSPITAL_COMMUNITY)
Admission: EM | Admit: 2023-02-03 | Discharge: 2023-02-03 | Disposition: A | Discharge status: Home / Self Care | Payer: 59 | Attending: Emergency Medicine | Admitting: Emergency Medicine

## 2023-02-03 ENCOUNTER — Encounter (HOSPITAL_COMMUNITY): Payer: Self-pay | Admitting: Emergency Medicine

## 2023-02-03 DIAGNOSIS — R0789 Other chest pain: Secondary | ICD-10-CM | POA: Insufficient documentation

## 2023-02-03 DIAGNOSIS — R079 Chest pain, unspecified: Secondary | ICD-10-CM | POA: Diagnosis present

## 2023-02-03 LAB — TROPONIN I (HIGH SENSITIVITY)
Troponin I (High Sensitivity): <2 ng/L (ref <18)
Troponin I (High Sensitivity): <2 ng/L (ref <18)

## 2023-02-03 LAB — BASIC METABOLIC PANEL
Anion gap: 8 (ref 5–15)
BUN: 14 mg/dL (ref 6–20)
CO2: 27 mmol/L (ref 22–32)
Calcium: 9 mg/dL (ref 8.9–10.3)
Chloride: 103 mmol/L (ref 98–111)
Creatinine, Ser: 0.81 mg/dL (ref 0.44–1.00)
GFR, Estimated: >60 mL/min (ref >60)
Glucose, Bld: 107 mg/dL — ABNORMAL HIGH (ref 70–99)
Potassium: 3.5 mmol/L (ref 3.5–5.1)
Sodium: 138 mmol/L (ref 135–145)

## 2023-02-03 LAB — CBC
HCT: 41.3 % (ref 36.0–46.0)
Hemoglobin: 14.2 g/dL (ref 12.0–15.0)
MCH: 30.6 pg (ref 26.0–34.0)
MCHC: 34.4 g/dL (ref 30.0–36.0)
MCV: 89 fL (ref 80.0–100.0)
Platelets: 364 10*3/uL (ref 150–400)
RBC: 4.64 MIL/uL (ref 3.87–5.11)
RDW: 12.4 % (ref 11.5–15.5)
WBC: 7.9 10*3/uL (ref 4.0–10.5)
nRBC: 0 % (ref 0.0–0.2)

## 2023-02-03 LAB — D-DIMER, QUANTITATIVE: D-Dimer, Quant: <0.27 ug/mL-FEU (ref 0.00–0.50)

## 2023-02-03 MED ORDER — DICLOFENAC SODIUM 1 % EX GEL: 2.0000 g | Freq: Four times a day (QID) | CUTANEOUS | 0 refills | Status: DC | PRN

## 2023-02-03 MED ORDER — DIAZEPAM 5 MG/ML IJ SOLN
5.0000 mg | Freq: Once | INTRAMUSCULAR | Status: AC
  Administered 2023-02-03: 5 mg via INTRAVENOUS
  Filled 2023-02-03: qty 2

## 2023-02-03 MED ORDER — CYCLOBENZAPRINE HCL 10 MG PO TABS: 10.0000 mg | ORAL_TABLET | Freq: Two times a day (BID) | ORAL | 0 refills | Status: DC | PRN

## 2023-02-03 NOTE — ED Notes (Signed)
Notified PA in regards to pt's oxygen desaturation following diazepam administration. Pt titrated to 5 L/M nasal cannula, pt SpO2 currently reading 100%.

## 2023-02-03 NOTE — ED Provider Notes (Signed)
[place] EMERGENCY DEPARTMENT AT [facility] Provider Note   CSN: [id number] Arrival date & time: 02/03/23  4098     History  Chief Complaint  Patient presents with   Chest Pain    Sheryl [name] is a 52 y.o. female.  The history is provided by the patient, medical records and the EMS personnel. No language interpreter was used.  Chest Pain    52 year old female who has significant history of thoracic aortic aneurysm, migraine, anemia, brought here via EMS with concerns of chest pain.  Patient reports she was awoke this morning with pain to the chest.  Pain is primarily on the left side of her chest, nonradiating, worse with any kind of movement or taking deep breath.  Pain felt somewhat similar to muscle pulled or pleurisy that she has had in the past.  She does not endorse any associated fever or chills no productive cough no hemoptysis no nausea vomiting dizziness or diaphoresis.  She tries taking meloxicam and Tylenol at home without adequate relief.  She did endorse having a pretty heavy workout with a kettle bell yesterday and her workout lasting for approximately 45 minutes.  This is much longer than usual.  She felt it may contribute to her pain.  She mention she has had heart catheterization in the past without any acute changes.  She denies alcohol or tobacco use.  No prior history of PE or DVT.  Home Medications Prior to Admission medications   Medication Sig Start Date End Date Taking? Authorizing Provider  acetaminophen (TYLENOL) 500 MG tablet Take 500-1,000 mg by mouth every 6 (six) hours as needed (for pain/headaches.).    [provider]  albuterol (VENTOLIN HFA) 108 (90 Base) MCG/ACT inhaler 2 puffs 05/21/20   [provider]  atenolol (TENORMIN) 25 MG tablet Take 1 tablet (25 mg total) by mouth daily. 08/11/22   Glean Salvo, NP  budesonide (PULMICORT) 0.5 MG/2ML nebulizer solution as needed.    [provider]   budesonide-formoterol (SYMBICORT) 80-4.5 MCG/ACT inhaler Symbicort 80 mcg-4.5 mcg/actuation HFA aerosol inhaler  Inhale 2 puffs twice a day by inhalation route.    [provider]  budesonide-formoterol (SYMBICORT) 80-4.5 MCG/ACT inhaler 2 puffs Inhalation twice a day 05/21/20   [provider]  cetirizine (ZYRTEC) 10 MG tablet Take 10 mg by mouth daily as needed for allergies ((hay fever/pollen)).     [provider]  cholecalciferol (VITAMIN D3) 25 MCG (1000 UNIT) tablet Take by mouth. 12/15/16   [provider]  cyclobenzaprine (FLEXERIL) 5 MG tablet Take 1 tablet (5 mg total) by mouth every 8 (eight) hours as needed for muscle spasms. 08/11/22   Glean Salvo, NP  eletriptan (RELPAX) 40 MG tablet TAKE 1 TABLET BY MOUTH FOR MIGRAINE MAY REPEAT IN 2 HOURS NO MORE THAN 2/24 AND MAX 2-3/WEEK    [provider]  Multiple Vitamin (MULTIVITAMIN WITH MINERALS) TABS tablet Take 1 tablet by mouth daily.    [provider]  ondansetron (ZOFRAN ODT) 4 MG disintegrating tablet Take 1 tablet (4 mg total) by mouth every 8 (eight) hours as needed. 08/11/22   Glean Salvo, NP  zolmitriptan (ZOMIG) 5 MG tablet TAKE 1 TABLET BY MOUTH AT ONSET OF MIGRAINE. MAY REPEAT IN 2 HOURS IF NEEDED. MAX 2 IN 24 HOURS 08/11/22   Glean Salvo, NP      Allergies    Aspirin, Salicylates, Codeine, and Fentanyl    Review of Systems  Review of Systems  Cardiovascular:  Positive for chest pain.  All other systems reviewed and are negative.   Physical Exam Updated Vital Signs BP (!) 126/97 (BP Location: Right Arm)   Pulse 73   Temp 97.6 F (36.4 C) (Oral)   Resp 16   Ht 5\' 7"  (1.702 m)   Wt 131.5 kg   LMP 05/26/2017 (Exact Date)   SpO2 96%   BMI 45.42 kg/m  Physical Exam Vitals and nursing note reviewed.  Constitutional:      General: She is not in acute distress.    Appearance: She is well-developed. She is obese.  HENT:     Head: Atraumatic.  Eyes:      Conjunctiva/sclera: Conjunctivae normal.  Cardiovascular:     Rate and Rhythm: Normal rate and regular rhythm.     Pulses: Normal pulses.     Heart sounds: Normal heart sounds.  Pulmonary:     Effort: Pulmonary effort is normal.     Breath sounds: No wheezing, rhonchi or rales.  Chest:     Chest wall: Tenderness (Tenderness to palpation of left chest wall without any crepitus or emphysema noted.) present.  Abdominal:     Tenderness: There is no right CVA tenderness or left CVA tenderness.  Musculoskeletal:        General: Normal range of motion.     Cervical back: Neck supple.  Skin:    Findings: No rash.  Neurological:     Mental Status: She is alert.  Psychiatric:        Mood and Affect: Mood normal.     ED Results / Procedures / Treatments   Labs (all labs ordered are listed, but only abnormal results are displayed) Labs Reviewed  BASIC METABOLIC PANEL - Abnormal; Notable for the following components:      Result Value   Glucose, Bld 107 (*)    All other components within normal limits  CBC  D-DIMER, QUANTITATIVE  TROPONIN I (HIGH SENSITIVITY)  TROPONIN I (HIGH SENSITIVITY)    EKG EKG Interpretation  Date/Time:  Thursday Feb 03 2023 03:52:58 EDT Ventricular Rate:  65 PR Interval:  133 QRS Duration: 97 QT Interval:  422 QTC Calculation: 439 R Axis:   73 Text Interpretation: Sinus rhythm Confirmed by Drema Pry [phone]) on 02/03/2023 5:06:58 AM ED ECG REPORT   Date: 02/03/2023  Rate: 65  Rhythm: normal sinus rhythm  QRS Axis: normal  Intervals: normal  ST/T Wave abnormalities: normal  Conduction Disutrbances:none  Narrative Interpretation:   Old EKG Reviewed: unchanged  I have personally reviewed the EKG tracing and agree with the computerized printout as noted.   Radiology DG Chest Port 1 View  Result Date: 02/03/2023 CLINICAL DATA:  Middle chest pain. EXAM: PORTABLE CHEST 1 VIEW COMPARISON:  CTA chest 08/20/2022 FINDINGS: There are multiple  overlying monitor wires on the left. There is mild cardiomegaly, without evidence of CHF. There is a low inspiration on exam with bronchovascular crowding but no convincing pneumonia. Evaluation of the lung bases is limited due to low inspiration. The mediastinum is stable with mild aortic tortuosity. Thoracic cage is intact.  Mild chronic elevation right diaphragm. IMPRESSION: Limited study due to low volumes and bronchovascular crowding. No definite acute chest process. Mild cardiomegaly. Aortic uncoiling. Electronically Signed   By: Almira Bar M.D.   On: 02/03/2023 05:03    Procedures Procedures    Medications Ordered in ED Medications  diazepam (VALIUM) injection 5 mg (5 mg Intravenous Given 02/03/23 0447)  ED Course/ Medical Decision Making/ A&P                             Medical Decision Making  BP (!) 126/97 (BP Location: Right Arm)   Pulse 73   Temp 97.6 F (36.4 C) (Oral)   Resp 16   Ht 5\' 7"  (1.702 m)   Wt 131.5 kg   LMP 05/26/2017 (Exact Date)   SpO2 96%   BMI 45.42 kg/m   20:33 AM 52 year old female who has significant history of thoracic aortic aneurysm, migraine, anemia, brought here via EMS with concerns of chest pain.  Patient reports she was awoke this morning with pain to the chest.  Pain is primarily on the left side of her chest, nonradiating, worse with any kind of movement or taking deep breath.  Pain felt somewhat similar to muscle pulled or pleurisy that she has had in the past.  She does not endorse any associated fever or chills no productive cough no hemoptysis no nausea vomiting dizziness or diaphoresis.  She tries taking meloxicam and Tylenol at home without adequate relief.  She did endorse having a pretty heavy workout with a kettle bell yesterday and her workout lasting for approximately 45 minutes.  This is much longer than usual.  She felt it may contribute to her pain.  She mention she has had heart catheterization in the past without any acute  changes.  She denies alcohol or tobacco use.  No prior history of PE or DVT.  On exam this is an obese female laying in bed appears to be in no acute discomfort.  However she appears to be wincing whenever she takes a deep breath.  She has reproducible left-sided chest wall tenderness on palpation without any skin rash or overlying skin changes.  Heart with normal rate and rhythm, lungs clear to auscultation bilaterally abdomen is soft and nontender.  Vital signs overall reassuring.  -Labs ordered, independently viewed and interpreted by me.  Labs remarkable for normal D-Dimer, doubt PE or dissection.  Normal trop, doubt ACS.  -The patient was maintained on a cardiac monitor.  I personally viewed and interpreted the cardiac monitored which showed an underlying rhythm of: NSR -Imaging independently viewed and interpreted by me and I agree with radiologist's interpretation.  Result remarkable for CXR without concerning changes -This patient presents to the ED for concern of chest pain, this involves an extensive number of treatment options, and is a complaint that carries with it a high risk of complications and morbidity.  The differential diagnosis includes costochondritis, ACS, PE, PTX, PNA, dissection, GERD, gastritis, shingles -Co morbidities that complicate the patient evaluation includes thoracic aortic aneurysm -Treatment includes valium -Reevaluation of the patient after these medicines showed that the patient improved -PCP office notes or outside notes reviewed -Escalation to admission/observation considered: patients feels much better, is comfortable with discharge, and will follow up with PCP -Prescription medication considered, patient comfortable with muscle relaxant, pain cream -Social Determinant of Health considered   6:45 AM Hear score was 2, low risk of Mace.  Negative delta troponin.  Labs overall reassuring, negative D-dimer, chest x-ray unremarkable, EKG without concerning  ischemic changes.  At this time I suspect patient's reproducible chest wall pain is likely costochondritis from recent bouts of exercise.  I have low suspicion for dissection or PE or acute life-threatening emergency.  I recommend muscle relaxant, and NSAID cream for symptom control.  Return precaution given.  Final Clinical Impression(s) / ED Diagnoses Final diagnoses:  Chest wall pain    Rx / DC Orders ED Discharge Orders          Ordered    diclofenac Sodium (VOLTAREN) 1 % GEL  4 times daily PRN        02/03/23 0648    cyclobenzaprine (FLEXERIL) 10 MG tablet  2 times daily PRN        02/03/23 0648              Fayrene Helper, PA-C 02/03/23 1610    Eudelia Bunch Amadeo Garnet, MD 02/03/23 [phone]

## 2023-02-03 NOTE — ED Triage Notes (Signed)
Pt BIB EMS with reports of left sided chest/rib pain. Pt states that she worked out too hard yesterday. Pt describes the pain as sharp when she takes a deep breath. Pt took a Meloxicam and Tylenol at home.

## 2023-02-13 ENCOUNTER — Encounter: Payer: Self-pay | Admitting: Emergency Medicine

## 2023-02-13 ENCOUNTER — Other Ambulatory Visit: Payer: Self-pay

## 2023-02-13 ENCOUNTER — Ambulatory Visit
Admission: EM | Admit: 2023-02-13 | Discharge: 2023-02-13 | Disposition: A | Discharge status: Home / Self Care | Payer: 59 | Attending: Family Medicine | Admitting: Family Medicine

## 2023-02-13 DIAGNOSIS — W57XXXA Bitten or stung by nonvenomous insect and other nonvenomous arthropods, initial encounter: Secondary | ICD-10-CM | POA: Diagnosis not present

## 2023-02-13 DIAGNOSIS — L03119 Cellulitis of unspecified part of limb: Secondary | ICD-10-CM | POA: Diagnosis not present

## 2023-02-13 MED ORDER — DOXYCYCLINE HYCLATE 100 MG PO CAPS: 100.0000 mg | ORAL_CAPSULE | Freq: Two times a day (BID) | ORAL | 0 refills | Status: AC

## 2023-02-13 MED ORDER — TRAMADOL HCL 50 MG PO TABS: 50.0000 mg | ORAL_TABLET | Freq: Four times a day (QID) | ORAL | 0 refills | Status: DC | PRN

## 2023-02-13 MED ORDER — PREDNISONE 20 MG PO TABS: 40.0000 mg | ORAL_TABLET | Freq: Every day | ORAL | 0 refills | Status: AC

## 2023-02-13 NOTE — ED Provider Notes (Addendum)
[facility]    CSN: [id number] Arrival date & time: 02/13/23  0855      History   Chief Complaint Chief Complaint  Patient presents with   Insect Bite    HPI Sheryl [name] is a 52 y.o. female.   HPI Here for itching and swelling and pain of her right hand.  Yesterday afternoon after she had been doing some cleaning and decluttering in her house, she noted itching of her right hand on the dorsum.  Then early this morning she awoke with pain and swelling and the pain is extending up into her forearm.  No fever or chills.  There is an area that is draining a little yellow fluid, for the most part clear but it was cloudy in the last hour or 2.  No vomiting no trouble breathing  She is allergic to salicylates and to fentanyl, and also to codeine, but she states she has tolerated tramadol fine.  She also mentions that 1 time in the last year or 2 she took Keflex and she had some feeling of swelling in her throat and mouth.  Past Medical History:  Diagnosis Date   Allergic rhinitis    Anemia    Atypical chest pain 01/07/2012   Ct chest 10/2011:  No acute process, no PE to midsized pulmonary vessels.     Chronic migraine    Migraines    Miscarriage 08/1996, 12/1996   x2   Normal labor 03/19/2014   PONV (postoperative nausea and vomiting)    Postoperative state 03/20/2014   SVD (spontaneous vaginal delivery)    x 2   Vitamin D deficiency     Patient Active Problem List   Diagnosis Date Noted   Symptomatic mammary hypertrophy 01/14/2023   Back pain 01/14/2023   Neck pain 01/14/2023   Thoracic aortic aneurysm without rupture (HCC) 07/28/2021   Chronic migraine w/o aura w/o status migrainosus, not intractable 06/15/2017   Postoperative state 03/20/2014   Normal labor 03/19/2014   Atypical chest pain 01/07/2012    Past Surgical History:  Procedure Laterality Date   ABDOMINAL HYSTERECTOMY     BREAST SURGERY     cyst removed from left breast; benign   CESAREAN  SECTION N/A 03/20/2014   Procedure: CESAREAN SECTION;  Surgeon: Loney Laurence, MD;  Location: [facility];  Service: Obstetrics;  Laterality: N/A;   cyst removed from L breast  10/2006   benign   CYSTOSCOPY N/A 06/01/2017   Procedure: CYSTOSCOPY;  Surgeon: Carrington Clamp, MD;  Location: [facility];  Service: Gynecology;  Laterality: N/A;   DILATION AND CURETTAGE OF UTERUS  08/1996, 12/1996   x 2 MAB   DILATION AND CURETTAGE OF UTERUS     KNEE ARTHROSCOPY Right 01/2016   KNEE ARTHROSCOPY Right 08/2016   LEFT HEART CATH AND CORONARY ANGIOGRAPHY N/A 04/14/2018   Procedure: LEFT HEART CATH AND CORONARY ANGIOGRAPHY;  Surgeon: Yvonne Kendall, MD;  Location: [facility];  Service: Cardiovascular;  Laterality: N/A;   live births  10/07/97, 02/08/2000   x2   ROBOTIC ASSISTED TOTAL HYSTERECTOMY WITH SALPINGECTOMY Bilateral 06/01/2017   Procedure: ROBOTIC ASSISTED TOTAL HYSTERECTOMY WITH SALPINGECTOMY WITH PARTIAL REMOVAL OF HERNIA SAC;  Surgeon: Carrington Clamp, MD;  Location: [facility];  Service: Gynecology;  Laterality: Bilateral;   TUBAL LIGATION Bilateral 03/20/2014   Procedure: BILATERAL TUBAL LIGATION;  Surgeon: Loney Laurence, MD;  Location: [facility];  Service: Obstetrics;  Laterality: Bilateral;   UNILATERAL SALPINGECTOMY Right 03/20/2014  Procedure: UNILATERAL SALPINGECTOMY;  Surgeon: Loney Laurence, MD;  Location: [facility];  Service: Obstetrics;  Laterality: Right;    OB History     Gravida  5   Para  3   Term  1   Preterm  2   AB  2   Living  3      SAB  2   IAB  0   Ectopic  0   Multiple      Live Births  1            Home Medications    Prior to Admission medications   Medication Sig Start Date End Date Taking? Authorizing Provider  doxycycline (VIBRAMYCIN) 100 MG capsule Take 1 capsule (100 mg total) by mouth 2 (two) times daily for 7 days. 02/13/23 02/20/23 Yes Alexiya Franqui, Janace Aris, MD  predniSONE (DELTASONE) 20 MG tablet Take 2 tablets (40 mg total) by  mouth daily with breakfast for 5 days. 02/13/23 02/18/23 Yes Kaiea Esselman, Janace Aris, MD  traMADol (ULTRAM) 50 MG tablet Take 1 tablet (50 mg total) by mouth every 6 (six) hours as needed (pain). 02/13/23  Yes Zenia Resides, MD  acetaminophen (TYLENOL) 500 MG tablet Take 500-1,000 mg by mouth every 6 (six) hours as needed (for pain/headaches.).    [provider]  albuterol (VENTOLIN HFA) 108 (90 Base) MCG/ACT inhaler 2 puffs 05/21/20   [provider]  atenolol (TENORMIN) 25 MG tablet Take 1 tablet (25 mg total) by mouth daily. 08/11/22   Glean Salvo, NP  budesonide (PULMICORT) 0.5 MG/2ML nebulizer solution as needed.    [provider]  budesonide-formoterol (SYMBICORT) 80-4.5 MCG/ACT inhaler Symbicort 80 mcg-4.5 mcg/actuation HFA aerosol inhaler  Inhale 2 puffs twice a day by inhalation route.    [provider]  budesonide-formoterol (SYMBICORT) 80-4.5 MCG/ACT inhaler 2 puffs Inhalation twice a day 05/21/20   [provider]  cetirizine (ZYRTEC) 10 MG tablet Take 10 mg by mouth daily as needed for allergies ((hay fever/pollen)).     [provider]  cholecalciferol (VITAMIN D3) 25 MCG (1000 UNIT) tablet Take by mouth. 12/15/16   [provider]  cyclobenzaprine (FLEXERIL) 10 MG tablet Take 1 tablet (10 mg total) by mouth 2 (two) times daily as needed for muscle spasms. 02/03/23   Fayrene Helper, PA-C  diclofenac Sodium (VOLTAREN) 1 % GEL Apply 2 g topically 4 (four) times daily as needed (apply to affected area as needed for pain). 02/03/23   Fayrene Helper, PA-C  eletriptan (RELPAX) 40 MG tablet TAKE 1 TABLET BY MOUTH FOR MIGRAINE MAY REPEAT IN 2 HOURS NO MORE THAN 2/24 AND MAX 2-3/WEEK    [provider]  Multiple Vitamin (MULTIVITAMIN WITH MINERALS) TABS tablet Take 1 tablet by mouth daily.    [provider]  ondansetron (ZOFRAN ODT) 4 MG disintegrating tablet Take 1 tablet (4 mg total) by mouth every 8 (eight) hours as needed.  08/11/22   Glean Salvo, NP  zolmitriptan (ZOMIG) 5 MG tablet TAKE 1 TABLET BY MOUTH AT ONSET OF MIGRAINE. MAY REPEAT IN 2 HOURS IF NEEDED. MAX 2 IN 24 HOURS 08/11/22   Glean Salvo, NP    Family History Family History  Problem Relation Age of Onset   Heart attack Father    Hypertension Father    Hyperlipidemia Father    Asthma Son    Heart disease Maternal Grandfather    Cancer Paternal Grandmother        unsure what  kind    Social History Social History   Tobacco Use   Smoking status: Never   Smokeless tobacco: Never  Vaping Use   Vaping Use: Never used  Substance Use Topics   Alcohol use: No   Drug use: No     Allergies   Aspirin, Salicylates, Codeine, and Fentanyl   Review of Systems Review of Systems   Physical Exam Triage Vital Signs ED Triage Vitals [02/13/23 0958]  Enc Vitals Group     BP 131/83     Pulse Rate 92     Resp 18     Temp 98.3 F (36.8 C)     Temp Source Oral     SpO2 96 %     Weight      Height      Head Circumference      Peak Flow      Pain Score 5     Pain Loc      Pain Edu?      Excl. in GC?    No data found.  Updated Vital Signs BP 131/83 (BP Location: Left Arm)   Pulse 92   Temp 98.3 F (36.8 C) (Oral)   Resp 18   LMP 05/26/2017 (Exact Date)   SpO2 96%   Visual Acuity Right Eye Distance:   Left Eye Distance:   Bilateral Distance:    Right Eye Near:   Left Eye Near:    Bilateral Near:     Physical Exam Vitals reviewed.  Constitutional:      General: She is not in acute distress.    Appearance: She is not ill-appearing, toxic-appearing or diaphoretic.  Skin:    Coloration: Skin is not pale.     Comments: There is faint erythema of the dorsum of the right hand and there is swelling of the dorsum of the right hand.  There is an area that is a little ulcerative and has yellow crusty material in it.  There is no fluctuance.  There is some milder erythema extending up the forearm.   Neurological:      General: No focal deficit present.     Mental Status: She is alert and oriented to person, place, and time.  Psychiatric:        Behavior: Behavior normal.      UC Treatments / Results  Labs (all labs ordered are listed, but only abnormal results are displayed) Labs Reviewed - No data to display  EKG   Radiology No results found.  Procedures Procedures (including critical care time)  Medications Ordered in UC Medications - No data to display  Initial Impression / Assessment and Plan / UC Course  I have reviewed the triage vital signs and the nursing notes.  Pertinent labs & imaging results that were available during my care of the patient were reviewed by me and considered in my medical decision making (see chart for details).        Due to the concern of possible reaction with Keflex in the past, doxycycline is sent in for possible cellulitis.  I do think this is more of a reaction to insect bite with prednisone is sent in also.  She requested some tramadol as she is allergic to salicylates.  That is reasonable, and she has not had any narcotic fills except for some cough syrup last year. Final Clinical Impressions(s) / UC Diagnoses   Final diagnoses:  Cellulitis of hand  Reaction to insect bite     Discharge  Instructions      Take doxycycline 100 mg --1 capsule 2 times daily for 7 days  Take prednisone 20 mg--2 daily for 5 days  Take tramadol 50 mg-- 1 tablet every 6 hours as needed for pain.  This medication can make you sleepy or dizzy      ED Prescriptions     Medication Sig Dispense Auth. Provider   doxycycline (VIBRAMYCIN) 100 MG capsule Take 1 capsule (100 mg total) by mouth 2 (two) times daily for 7 days. 14 capsule Zenia Resides, MD   predniSONE (DELTASONE) 20 MG tablet Take 2 tablets (40 mg total) by mouth daily with breakfast for 5 days. 10 tablet Zenia Resides, MD   traMADol (ULTRAM) 50 MG tablet Take 1 tablet (50 mg total) by  mouth every 6 (six) hours as needed (pain). 6 tablet Akansha Wyche, Janace Aris, MD      I have reviewed the PDMP during this encounter.   Zenia Resides, MD 02/13/23 1041    Zenia Resides, MD 02/13/23 [phone]

## 2023-02-13 NOTE — Discharge Instructions (Signed)
Take doxycycline 100 mg --1 capsule 2 times daily for 7 days  Take prednisone 20 mg--2 daily for 5 days  Take tramadol 50 mg-- 1 tablet every 6 hours as needed for pain.  This medication can make you sleepy or dizzy

## 2023-02-13 NOTE — ED Triage Notes (Signed)
Pt here for right hand pain and swelling after insect bite yesterday

## 2023-03-04 ENCOUNTER — Ambulatory Visit (INDEPENDENT_AMBULATORY_CARE_PROVIDER_SITE_OTHER): Payer: 59 | Admitting: Plastic Surgery

## 2023-03-04 ENCOUNTER — Encounter: Payer: Self-pay | Admitting: Plastic Surgery

## 2023-03-04 DIAGNOSIS — G8929 Other chronic pain: Secondary | ICD-10-CM

## 2023-03-04 DIAGNOSIS — N62 Hypertrophy of breast: Secondary | ICD-10-CM | POA: Diagnosis not present

## 2023-03-04 DIAGNOSIS — M542 Cervicalgia: Secondary | ICD-10-CM | POA: Diagnosis not present

## 2023-03-04 DIAGNOSIS — M546 Pain in thoracic spine: Secondary | ICD-10-CM | POA: Diagnosis not present

## 2023-03-04 NOTE — Progress Notes (Signed)
   Subjective:    Patient ID: Sheryl [name], female    DOB: [birth year], 52 y.o.   MRN: [id number]  The patient is a 52 year old female joining me by phone for further discussion about her breast reduction.  The patient has been working on weight reduction.  She is now around 290 pounds.  This improves her BMI by a whole point.  She still has the neck and back pain.  She thinks that now that she is headed in the right direction she may be able to reach a better weight with continued effort.  She is 5 feet 7 inches tall and wears a 44 DDD bra cup.    Review of Systems  Constitutional: Negative.   HENT: Negative.    Eyes: Negative.   Respiratory: Negative.    Cardiovascular: Negative.   Gastrointestinal: Negative.   Endocrine: Negative.   Genitourinary: Negative.        Objective:   Physical Exam      Assessment & Plan:     ICD-10-CM   1. Symptomatic mammary hypertrophy  N62     2. Chronic bilateral thoracic back pain  M54.6    G89.29     3. Neck pain  M54.2      I connected with  Sheryl [name] on 03/04/23 by phone and verified that I am speaking with the correct person using two identifiers.  Patient was at home and I was at the office.  We spent 5 minutes in discussion.  The patient is going to plan on losing more weight and coming to see [place] in the next 2 months.   I discussed the limitations of evaluation and management by telemedicine. The patient expressed understanding and agreed to proceed.   Pictures were obtained of the patient and placed in the chart with the patient's or guardian's permission.

## 2023-04-12 ENCOUNTER — Ambulatory Visit (INDEPENDENT_AMBULATORY_CARE_PROVIDER_SITE_OTHER): Payer: 59 | Admitting: Plastic Surgery

## 2023-04-12 ENCOUNTER — Encounter: Payer: Self-pay | Admitting: Plastic Surgery

## 2023-04-12 VITALS — BP 127/78 | HR 75 | Ht 67.0 in | Wt 289.6 lb

## 2023-04-12 DIAGNOSIS — N62 Hypertrophy of breast: Secondary | ICD-10-CM | POA: Diagnosis not present

## 2023-04-12 NOTE — Progress Notes (Signed)
   Subjective:    Patient ID: Sheryl [name], female    DOB: 14-Dec-1970, 52 y.o.   MRN: [id number]  The patient is a 52 year old female here with her son for a reevaluation of her breast.  The patient has been doing well to keep her weight down.  She has lost a few pounds.  She is 289 pounds and 5 feet 7 inches tall.  She is eating healthier and getting more exercise.  Her sternal notch to nipple distance is 39 on the right and 40 cm on the left.  She will require 1000 g off each breast in order to be approved by insurance.  I believe we can get this amount off.  She complains of neck pain and back pain.  She has extremely large breasts and they are not getting smaller with her weight loss.  She has trouble with exercise due to the excess weight of her breasts.  She understands this may make her a B or C cup.      Review of Systems  Constitutional:  Positive for activity change. Negative for appetite change.  Eyes: Negative.   Respiratory: Negative.    Cardiovascular: Negative.   Gastrointestinal: Negative.   Endocrine: Negative.   Musculoskeletal:  Positive for back pain and neck pain.  Skin:  Positive for rash.       Objective:   Physical Exam Vitals and nursing note reviewed.  Constitutional:      Appearance: Normal appearance.  HENT:     Head: Atraumatic.  Cardiovascular:     Rate and Rhythm: Normal rate.  Pulmonary:     Effort: Pulmonary effort is normal.  Skin:    Coloration: Skin is not jaundiced.  Neurological:     Mental Status: She is alert and oriented to person, place, and time.  Psychiatric:        Mood and Affect: Mood normal.        Behavior: Behavior normal.        Thought Content: Thought content normal.        Judgment: Judgment normal.       Assessment & Plan:     ICD-10-CM   1. Symptomatic mammary hypertrophy  N62        Patient is a good candidate for bilateral breast reduction with possible lateral liposuction.  Patient understands she needs to  continue to lose weight while we get this submitted to insurance.

## 2023-05-16 ENCOUNTER — Telehealth: Payer: Self-pay | Admitting: Plastic Surgery

## 2023-05-16 NOTE — Telephone Encounter (Signed)
Patient called requesting the status of her insurance approval and surgery.  She was last seen on 04/12/23. Please call her at [phone].  The patient also mentioned that she wasn't sure if you received the voicemail messages she has left.

## 2023-05-18 ENCOUNTER — Telehealth: Payer: Self-pay | Admitting: Plastic Surgery

## 2023-05-18 NOTE — Telephone Encounter (Signed)
Patient called and requested to speak with Misty Stanley about upcoming sx. She is requesting a call back on her home phone [phone].

## 2023-05-27 ENCOUNTER — Telehealth: Payer: Self-pay | Admitting: Plastic Surgery

## 2023-05-30 ENCOUNTER — Ambulatory Visit: Payer: 59 | Admitting: Plastic Surgery

## 2023-05-30 DIAGNOSIS — N62 Hypertrophy of breast: Secondary | ICD-10-CM

## 2023-05-30 DIAGNOSIS — M546 Pain in thoracic spine: Secondary | ICD-10-CM

## 2023-05-30 DIAGNOSIS — R21 Rash and other nonspecific skin eruption: Secondary | ICD-10-CM

## 2023-05-30 DIAGNOSIS — M542 Cervicalgia: Secondary | ICD-10-CM | POA: Diagnosis not present

## 2023-05-30 DIAGNOSIS — G8929 Other chronic pain: Secondary | ICD-10-CM

## 2023-05-30 NOTE — Progress Notes (Addendum)
Patient ID: Sheryl [name], female    DOB: 1971-05-31, 52 y.o.   MRN: [id number]   Chief Complaint  Patient presents with   Breast Problem    Patient is a 52 year old female joining me by phone for further discussion about her large breasts.  The patient complains of back pain that involves her upper and lower back as well as her neck.  She has tried to use better bra padding and adjusting her bra to gain some comfort.  This has not worked due to her large breast size.  She has tried over-the-counter pain medication.  This is only temporary in helping aleve her discomfort.  Her breasts are extremely large which makes it difficult for her to do any strenuous activity including exercise.  When she is more active she gets warm in her skin creases and is dealing with a lot of skin breakdown and rashes. This patient was seen in May, June and August.  Her sternal notch to nipple distance on the right is 39 cm and 40 cm on the left.  Her inframammary distance to her areola is 20 cm.  She is 5 feet 7 inches tall and weighs 287 pounds. Her current BMI is 45 kg/m.  Her preoperative bra size is a 50 DDD cup.  She is hoping for a D or C cup size.  She is not a tobacco user.  She has had a breast biopsy in the past which was negative.  She does not have diabetes.  She has frequent migraines that may be contributed to the bra and breast size.  Her past surgical history includes abdominal hysterectomy, C-section, knee arthroscopy, and tubal ligation.  Patient had a mammogram through her OB/GYN and it was negative. She will have those results sent to [place].     Review of Systems  Constitutional:  Positive for activity change.  HENT: Negative.    Eyes: Negative.   Respiratory: Negative.    Cardiovascular: Negative.   Gastrointestinal: Negative.   Endocrine: Negative.   Genitourinary: Negative.   Musculoskeletal:  Positive for back pain and neck pain.  Skin:  Positive for rash.    Past Medical History:   Diagnosis Date   Allergic rhinitis    Anemia    Atypical chest pain 01/07/2012   Ct chest 10/2011:  No acute process, no PE to midsized pulmonary vessels.     Chronic migraine    Migraines    Miscarriage 08/1996, 12/1996   x2   Normal labor 03/19/2014   PONV (postoperative nausea and vomiting)    Postoperative state 03/20/2014   SVD (spontaneous vaginal delivery)    x 2   Vitamin D deficiency     Past Surgical History:  Procedure Laterality Date   ABDOMINAL HYSTERECTOMY     BREAST SURGERY     cyst removed from left breast; benign   CESAREAN SECTION N/A 03/20/2014   Procedure: CESAREAN SECTION;  Surgeon: Loney Laurence, MD;  Location: [facility];  Service: Obstetrics;  Laterality: N/A;   cyst removed from L breast  10/2006   benign   CYSTOSCOPY N/A 06/01/2017   Procedure: CYSTOSCOPY;  Surgeon: Carrington Clamp, MD;  Location: [facility];  Service: Gynecology;  Laterality: N/A;   DILATION AND CURETTAGE OF UTERUS  08/1996, 12/1996   x 2 MAB   DILATION AND CURETTAGE OF UTERUS     KNEE ARTHROSCOPY Right 01/2016   KNEE ARTHROSCOPY Right 08/2016   LEFT HEART CATH AND CORONARY  ANGIOGRAPHY N/A 04/14/2018   Procedure: LEFT HEART CATH AND CORONARY ANGIOGRAPHY;  Surgeon: Yvonne Kendall, MD;  Location: [facility];  Service: Cardiovascular;  Laterality: N/A;   live births  10/07/97, 02/08/2000   x2   ROBOTIC ASSISTED TOTAL HYSTERECTOMY WITH SALPINGECTOMY Bilateral 06/01/2017   Procedure: ROBOTIC ASSISTED TOTAL HYSTERECTOMY WITH SALPINGECTOMY WITH PARTIAL REMOVAL OF HERNIA SAC;  Surgeon: Carrington Clamp, MD;  Location: [facility];  Service: Gynecology;  Laterality: Bilateral;   TUBAL LIGATION Bilateral 03/20/2014   Procedure: BILATERAL TUBAL LIGATION;  Surgeon: Loney Laurence, MD;  Location: [facility];  Service: Obstetrics;  Laterality: Bilateral;   UNILATERAL SALPINGECTOMY Right 03/20/2014   Procedure: UNILATERAL SALPINGECTOMY;  Surgeon: Loney Laurence, MD;  Location: [facility];  Service:  Obstetrics;  Laterality: Right;      Current Outpatient Medications:    acetaminophen (TYLENOL) 500 MG tablet, Take 500-1,000 mg by mouth every 6 (six) hours as needed (for pain/headaches.)., Disp: , Rfl:    albuterol (VENTOLIN HFA) 108 (90 Base) MCG/ACT inhaler, 2 puffs, Disp: , Rfl:    atenolol (TENORMIN) 25 MG tablet, Take 1 tablet (25 mg total) by mouth daily., Disp: 90 tablet, Rfl: 4   budesonide (PULMICORT) 0.5 MG/2ML nebulizer solution, as needed., Disp: , Rfl:    budesonide-formoterol (SYMBICORT) 80-4.5 MCG/ACT inhaler, Symbicort 80 mcg-4.5 mcg/actuation HFA aerosol inhaler  Inhale 2 puffs twice a day by inhalation route., Disp: , Rfl:    budesonide-formoterol (SYMBICORT) 80-4.5 MCG/ACT inhaler, 2 puffs Inhalation twice a day, Disp: , Rfl:    cetirizine (ZYRTEC) 10 MG tablet, Take 10 mg by mouth daily as needed for allergies ((hay fever/pollen)). , Disp: , Rfl:    cholecalciferol (VITAMIN D3) 25 MCG (1000 UNIT) tablet, Take by mouth., Disp: , Rfl:    cyclobenzaprine (FLEXERIL) 10 MG tablet, Take 1 tablet (10 mg total) by mouth 2 (two) times daily as needed for muscle spasms., Disp: 20 tablet, Rfl: 0   diclofenac Sodium (VOLTAREN) 1 % GEL, Apply 2 g topically 4 (four) times daily as needed (apply to affected area as needed for pain)., Disp: 100 g, Rfl: 0   eletriptan (RELPAX) 40 MG tablet, TAKE 1 TABLET BY MOUTH FOR MIGRAINE MAY REPEAT IN 2 HOURS NO MORE THAN 2/24 AND MAX 2-3/WEEK, Disp: , Rfl:    Multiple Vitamin (MULTIVITAMIN WITH MINERALS) TABS tablet, Take 1 tablet by mouth daily., Disp: , Rfl:    ondansetron (ZOFRAN ODT) 4 MG disintegrating tablet, Take 1 tablet (4 mg total) by mouth every 8 (eight) hours as needed., Disp: 20 tablet, Rfl: 3   zolmitriptan (ZOMIG) 5 MG tablet, TAKE 1 TABLET BY MOUTH AT ONSET OF MIGRAINE. MAY REPEAT IN 2 HOURS IF NEEDED. MAX 2 IN 24 HOURS, Disp: 12 tablet, Rfl: 11   Objective:   There were no vitals filed for this visit.  Physical Exam  Assessment  & Plan:  Symptomatic mammary hypertrophy  Chronic bilateral thoracic back pain  Neck pain  The patient is a good candidate for bilateral breast reduction with possible liposuction.  I think this will help with her overall function. We spent 5 min in discussion.  I connected with  [name] on 05/31/23 by phone and verified that I am speaking with the correct person.  The patient was at work and I was at the office.   I discussed the limitations of evaluation and management by telemedicine. The patient expressed understanding and agreed to proceed.   Alena Bills Giamarie Bueche, DO

## 2023-05-30 NOTE — Telephone Encounter (Signed)
TR SHE EXPLAINED THAT MESSAGES ARE NOT GETTING THROUGH - WILL RESEARCH AND CORRECT

## 2023-05-30 NOTE — H&P (View-Only) (Signed)
Patient ID: Sheryl [name], female    DOB: 1971-05-31, 52 y.o.   MRN: [id number]   Chief Complaint  Patient presents with   Breast Problem    Patient is a 52 year old female joining me by phone for further discussion about her large breasts.  The patient complains of back pain that involves her upper and lower back as well as her neck.  She has tried to use better bra padding and adjusting her bra to gain some comfort.  This has not worked due to her large breast size.  She has tried over-the-counter pain medication.  This is only temporary in helping aleve her discomfort.  Her breasts are extremely large which makes it difficult for her to do any strenuous activity including exercise.  When she is more active she gets warm in her skin creases and is dealing with a lot of skin breakdown and rashes. This patient was seen in May, June and August.  Her sternal notch to nipple distance on the right is 39 cm and 40 cm on the left.  Her inframammary distance to her areola is 20 cm.  She is 5 feet 7 inches tall and weighs 287 pounds. Her current BMI is 45 kg/m.  Her preoperative bra size is a 50 DDD cup.  She is hoping for a D or C cup size.  She is not a tobacco user.  She has had a breast biopsy in the past which was negative.  She does not have diabetes.  She has frequent migraines that may be contributed to the bra and breast size.  Her past surgical history includes abdominal hysterectomy, C-section, knee arthroscopy, and tubal ligation.  Patient had a mammogram through her OB/GYN and it was negative. She will have those results sent to [place].     Review of Systems  Constitutional:  Positive for activity change.  HENT: Negative.    Eyes: Negative.   Respiratory: Negative.    Cardiovascular: Negative.   Gastrointestinal: Negative.   Endocrine: Negative.   Genitourinary: Negative.   Musculoskeletal:  Positive for back pain and neck pain.  Skin:  Positive for rash.    Past Medical History:   Diagnosis Date   Allergic rhinitis    Anemia    Atypical chest pain 01/07/2012   Ct chest 10/2011:  No acute process, no PE to midsized pulmonary vessels.     Chronic migraine    Migraines    Miscarriage 08/1996, 12/1996   x2   Normal labor 03/19/2014   PONV (postoperative nausea and vomiting)    Postoperative state 03/20/2014   SVD (spontaneous vaginal delivery)    x 2   Vitamin D deficiency     Past Surgical History:  Procedure Laterality Date   ABDOMINAL HYSTERECTOMY     BREAST SURGERY     cyst removed from left breast; benign   CESAREAN SECTION N/A 03/20/2014   Procedure: CESAREAN SECTION;  Surgeon: Loney Laurence, MD;  Location: [facility];  Service: Obstetrics;  Laterality: N/A;   cyst removed from L breast  10/2006   benign   CYSTOSCOPY N/A 06/01/2017   Procedure: CYSTOSCOPY;  Surgeon: Carrington Clamp, MD;  Location: [facility];  Service: Gynecology;  Laterality: N/A;   DILATION AND CURETTAGE OF UTERUS  08/1996, 12/1996   x 2 MAB   DILATION AND CURETTAGE OF UTERUS     KNEE ARTHROSCOPY Right 01/2016   KNEE ARTHROSCOPY Right 08/2016   LEFT HEART CATH AND CORONARY  ANGIOGRAPHY N/A 04/14/2018   Procedure: LEFT HEART CATH AND CORONARY ANGIOGRAPHY;  Surgeon: Yvonne Kendall, MD;  Location: [facility];  Service: Cardiovascular;  Laterality: N/A;   live births  10/07/97, 02/08/2000   x2   ROBOTIC ASSISTED TOTAL HYSTERECTOMY WITH SALPINGECTOMY Bilateral 06/01/2017   Procedure: ROBOTIC ASSISTED TOTAL HYSTERECTOMY WITH SALPINGECTOMY WITH PARTIAL REMOVAL OF HERNIA SAC;  Surgeon: Carrington Clamp, MD;  Location: [facility];  Service: Gynecology;  Laterality: Bilateral;   TUBAL LIGATION Bilateral 03/20/2014   Procedure: BILATERAL TUBAL LIGATION;  Surgeon: Loney Laurence, MD;  Location: [facility];  Service: Obstetrics;  Laterality: Bilateral;   UNILATERAL SALPINGECTOMY Right 03/20/2014   Procedure: UNILATERAL SALPINGECTOMY;  Surgeon: Loney Laurence, MD;  Location: [facility];  Service:  Obstetrics;  Laterality: Right;      Current Outpatient Medications:    acetaminophen (TYLENOL) 500 MG tablet, Take 500-1,000 mg by mouth every 6 (six) hours as needed (for pain/headaches.)., Disp: , Rfl:    albuterol (VENTOLIN HFA) 108 (90 Base) MCG/ACT inhaler, 2 puffs, Disp: , Rfl:    atenolol (TENORMIN) 25 MG tablet, Take 1 tablet (25 mg total) by mouth daily., Disp: 90 tablet, Rfl: 4   budesonide (PULMICORT) 0.5 MG/2ML nebulizer solution, as needed., Disp: , Rfl:    budesonide-formoterol (SYMBICORT) 80-4.5 MCG/ACT inhaler, Symbicort 80 mcg-4.5 mcg/actuation HFA aerosol inhaler  Inhale 2 puffs twice a day by inhalation route., Disp: , Rfl:    budesonide-formoterol (SYMBICORT) 80-4.5 MCG/ACT inhaler, 2 puffs Inhalation twice a day, Disp: , Rfl:    cetirizine (ZYRTEC) 10 MG tablet, Take 10 mg by mouth daily as needed for allergies ((hay fever/pollen)). , Disp: , Rfl:    cholecalciferol (VITAMIN D3) 25 MCG (1000 UNIT) tablet, Take by mouth., Disp: , Rfl:    cyclobenzaprine (FLEXERIL) 10 MG tablet, Take 1 tablet (10 mg total) by mouth 2 (two) times daily as needed for muscle spasms., Disp: 20 tablet, Rfl: 0   diclofenac Sodium (VOLTAREN) 1 % GEL, Apply 2 g topically 4 (four) times daily as needed (apply to affected area as needed for pain)., Disp: 100 g, Rfl: 0   eletriptan (RELPAX) 40 MG tablet, TAKE 1 TABLET BY MOUTH FOR MIGRAINE MAY REPEAT IN 2 HOURS NO MORE THAN 2/24 AND MAX 2-3/WEEK, Disp: , Rfl:    Multiple Vitamin (MULTIVITAMIN WITH MINERALS) TABS tablet, Take 1 tablet by mouth daily., Disp: , Rfl:    ondansetron (ZOFRAN ODT) 4 MG disintegrating tablet, Take 1 tablet (4 mg total) by mouth every 8 (eight) hours as needed., Disp: 20 tablet, Rfl: 3   zolmitriptan (ZOMIG) 5 MG tablet, TAKE 1 TABLET BY MOUTH AT ONSET OF MIGRAINE. MAY REPEAT IN 2 HOURS IF NEEDED. MAX 2 IN 24 HOURS, Disp: 12 tablet, Rfl: 11   Objective:   There were no vitals filed for this visit.  Physical Exam  Assessment  & Plan:  Symptomatic mammary hypertrophy  Chronic bilateral thoracic back pain  Neck pain  The patient is a good candidate for bilateral breast reduction with possible liposuction.  I think this will help with her overall function. We spent 5 min in discussion.  I connected with  [name] on 05/31/23 by phone and verified that I am speaking with the correct person.  The patient was at work and I was at the office.   I discussed the limitations of evaluation and management by telemedicine. The patient expressed understanding and agreed to proceed.   Alena Bills Giamarie Bueche, DO

## 2023-06-13 ENCOUNTER — Telehealth: Payer: Self-pay | Admitting: *Deleted

## 2023-06-13 ENCOUNTER — Ambulatory Visit (INDEPENDENT_AMBULATORY_CARE_PROVIDER_SITE_OTHER): Payer: 59 | Admitting: Student

## 2023-06-13 VITALS — BP 147/83 | HR 62

## 2023-06-13 DIAGNOSIS — N62 Hypertrophy of breast: Secondary | ICD-10-CM

## 2023-06-13 MED ORDER — OXYCODONE HCL 5 MG PO TABS: 5.0000 mg | ORAL_TABLET | Freq: Four times a day (QID) | ORAL | 0 refills | Status: DC | PRN

## 2023-06-13 MED ORDER — DOXYCYCLINE HYCLATE 100 MG PO TABS: 100.0000 mg | ORAL_TABLET | Freq: Two times a day (BID) | ORAL | 0 refills | Status: AC

## 2023-06-13 MED ORDER — ONDANSETRON HCL 4 MG PO TABS: 4.0000 mg | ORAL_TABLET | Freq: Three times a day (TID) | ORAL | 0 refills | Status: AC | PRN

## 2023-06-13 NOTE — Telephone Encounter (Signed)
   Pre-operative Risk Assessment    Patient Name: Sheryl [name]  DOB: 10-16-1970 MRN: [id number]  DATE OF LAST VISIT: 07/08/21 DR. NISHAN DATE OF NEXT VISIT: NONE    Request for Surgical Clearance    Procedure:   BREAST REDUCTION  Date of Surgery:  Clearance 06/22/23                                 Surgeon:  DR. Foster Simpson Surgeon's Group or Practice Name:  [facility] Phone number:  [phone] Fax number:  [phone]   Type of Clearance Requested:   - Medical ; NO MEDICATIONS LISTED AS NEEDING TO BE HELD   Type of Anesthesia:  CHOICE   Additional requests/questions:    Elpidio Anis   06/13/2023, 5:14 PM

## 2023-06-13 NOTE — Progress Notes (Unsigned)
Patient ID: Sheryl [name], female    DOB: 1970/10/09, 52 y.o.   MRN: [id number]  Chief Complaint  Patient presents with   Pre-op Exam      ICD-10-CM   1. Symptomatic mammary hypertrophy  N62        History of Present Illness: Sheryl [name] is a 52 y.o.  female  with a history of macromastia.  She presents for preoperative evaluation for upcoming procedure, Bilateral Breast Reduction with liposuction, scheduled for 06/22/2023 with Dr.  Ulice Bold  The patient states that last time she had surgery, she had a little bit of a slower wake up but otherwise denies any issues with anesthesia.  Patient states that she had a mammogram in March which was negative.  Patient denies any personal family history of breast cancer.  Patient does report she had an aneurysm found 3 years ago and follows up with cardiothoracic surgery yearly for this.  She states that last December was her last visit.  Patient states she also had a heart catheterization back in 2021.  She denies taking any blood thinners.  She states she is not a smoker  Patient denies taking any birth control or hormone replacement.  She reports history of 2 miscarriages.  She denies any personal history of blood clots.  She states that her first cousin had both DVT and PE.  She denies any personal family history of clotting diseases.  Patient denies any recent surgeries, traumas, infections.  She does state that she had COVID back in August, but states she is fully recovered.  She denies any history of stroke or heart attack.  She denies any history of Crohn's disease or ulcerative colitis.  She denies any history of COPD.  Patient states she has asthma, more related to her allergies and it is very controlled.  Patient denies any history of cancer or varicosities to her lower extremities.  She denies any recent fevers or chills.  Patient reports she is currently a 50 DDD cup.  She states she would like to be a D cup.  Discussed with patient that  cup size cannot be guaranteed.  Patient expressed understanding.  Summary of Previous Visit: Patient was seen for initial consult by Dr. Ulice Bold on 01/14/2023.  At this visit, patient complained of back and neck pain related to her enlarged breasts.  Her STN on the right was 39 cm and her STN on the left was 40 cm.  Her BMI was 46.4 kg/m.  Her preoperative bra size was a DDD cup, patient stated she wanted to be around a D cup.  The amount of excess tissue to be removed at the time of surgery was 1150 g bilaterally.  Patient was found to be a good candidate for healthy weight and wellness and was encouraged to work on losing weight.  Patient was then seen in the clinic again on 03/04/2023.  Patient had lost a little bit of weight and patient was motivated to continue to lose weight.  She was then seen again on 04/12/2023.  Patient had lost a few pounds and had been trying to keep her weight down.  1000 g off of each breast was required to be removed to be improved by insurance.  Dr. Ulice Bold believes that she could get this amount off.  Patient was then seen again in the clinic on 05/30/2023.  The patient was complaining of back pain that involves her upper and lower back as well as her  neck.  Her preoperative bra size was a 50 DDD cup, patient was hoping for a D or C cup.  The patient was found to be a good candidate for bilateral breast reduction with possible liposuction.  Estimated excess breast tissue to be removed at time of surgery: 1000-1150 grams bilaterally  Job: Works for [employer], desk job, planning to take 2 weeks off  PMH Significant for: Thoracic aortic aneurysm, migraine, symptomatic mammary hypertrophy.  Patient states that she is taking naltrexone.  She states that this is a fairly new drug for her.  She states that her PCP gave it to her after she had COVID which affected her hearing.  She was told that they were going to try this medication for it.   Past Medical  History: Allergies: Allergies  Allergen Reactions   Aspirin Anaphylaxis and Hives   Salicylates Anaphylaxis and Hives    Unknown reaction Sulfur meds   Codeine Other (See Comments)    Migraines, anxiety    Fentanyl Nausea And Vomiting    Migraine    Current Medications:  Current Outpatient Medications:    acetaminophen (TYLENOL) 500 MG tablet, Take 500-1,000 mg by mouth every 6 (six) hours as needed (for pain/headaches.)., Disp: , Rfl:    albuterol (PROVENTIL) (2.5 MG/3ML) 0.083% nebulizer solution, Take 2.5 mg by nebulization every 6 (six) hours as needed for shortness of breath., Disp: , Rfl:    albuterol (VENTOLIN HFA) 108 (90 Base) MCG/ACT inhaler, Inhale 2 puffs into the lungs every 6 (six) hours as needed for wheezing or shortness of breath., Disp: , Rfl:    ascorbic acid (VITAMIN C) 500 MG tablet, Take 500 mg by mouth daily., Disp: , Rfl:    atenolol (TENORMIN) 25 MG tablet, Take 1 tablet (25 mg total) by mouth daily., Disp: 90 tablet, Rfl: 4   cholecalciferol (VITAMIN D3) 25 MCG (1000 UNIT) tablet, Take 1,000 Units by mouth daily., Disp: , Rfl:    cyanocobalamin (VITAMIN B12) 1000 MCG tablet, Take 1,000 mcg by mouth daily., Disp: , Rfl:    diphenhydrAMINE-zinc acetate (BENADRYL) cream, Apply 1 Application topically 3 (three) times daily as needed for itching., Disp: , Rfl:    doxycycline (VIBRA-TABS) 100 MG tablet, Take 1 tablet (100 mg total) by mouth 2 (two) times daily for 5 days., Disp: 10 tablet, Rfl: 0   fexofenadine-pseudoephedrine (ALLEGRA-D 24) 180-240 MG 24 hr tablet, Take 1 tablet by mouth daily., Disp: , Rfl:    fluticasone (FLONASE) 50 MCG/ACT nasal spray, Place 2 sprays into both nostrils daily., Disp: , Rfl:    ipratropium-albuterol (DUONEB) 0.5-2.5 (3) MG/3ML SOLN, Take 3 mLs by nebulization every 6 (six) hours as needed (severe shortness of breath)., Disp: , Rfl:    Melatonin 10 MG TABS, Take 10 mg by mouth at bedtime., Disp: , Rfl:    NALTREXONE HCL PO, Take  0.5-1 mg by mouth See admin instructions. Take 0.5 mg daily for 7 days, then 0.5 mg twice daily for 7 days, then 1 mg in the morning and 0.5 mg in the evening for 7 days, and 1 mg twice daily for 1 week, Disp: , Rfl:    ondansetron (ZOFRAN) 4 MG tablet, Take 1 tablet (4 mg total) by mouth every 8 (eight) hours as needed for up to 20 doses for nausea or vomiting., Disp: 20 tablet, Rfl: 0   zolmitriptan (ZOMIG) 5 MG tablet, TAKE 1 TABLET BY MOUTH AT ONSET OF MIGRAINE. MAY REPEAT IN 2 HOURS IF NEEDED. MAX  2 IN 24 HOURS, Disp: 12 tablet, Rfl: 11  Past Medical Problems: Past Medical History:  Diagnosis Date   Allergic rhinitis    Anemia    Atypical chest pain 01/07/2012   Ct chest 10/2011:  No acute process, no PE to midsized pulmonary vessels.     Chronic migraine    Migraines    Miscarriage 08/1996, 12/1996   x2   Normal labor 03/19/2014   PONV (postoperative nausea and vomiting)    Postoperative state 03/20/2014   SVD (spontaneous vaginal delivery)    x 2   Vitamin D deficiency     Past Surgical History: Past Surgical History:  Procedure Laterality Date   ABDOMINAL HYSTERECTOMY     BREAST SURGERY     cyst removed from left breast; benign   CESAREAN SECTION N/A 03/20/2014   Procedure: CESAREAN SECTION;  Surgeon: Loney Laurence, MD;  Location: [facility];  Service: Obstetrics;  Laterality: N/A;   cyst removed from L breast  10/2006   benign   CYSTOSCOPY N/A 06/01/2017   Procedure: CYSTOSCOPY;  Surgeon: Carrington Clamp, MD;  Location: [facility];  Service: Gynecology;  Laterality: N/A;   DILATION AND CURETTAGE OF UTERUS  08/1996, 12/1996   x 2 MAB   DILATION AND CURETTAGE OF UTERUS     KNEE ARTHROSCOPY Right 01/2016   KNEE ARTHROSCOPY Right 08/2016   LEFT HEART CATH AND CORONARY ANGIOGRAPHY N/A 04/14/2018   Procedure: LEFT HEART CATH AND CORONARY ANGIOGRAPHY;  Surgeon: Yvonne Kendall, MD;  Location: [facility];  Service: Cardiovascular;  Laterality: N/A;   live births  10/07/97,  02/08/2000   x2   ROBOTIC ASSISTED TOTAL HYSTERECTOMY WITH SALPINGECTOMY Bilateral 06/01/2017   Procedure: ROBOTIC ASSISTED TOTAL HYSTERECTOMY WITH SALPINGECTOMY WITH PARTIAL REMOVAL OF HERNIA SAC;  Surgeon: Carrington Clamp, MD;  Location: [facility];  Service: Gynecology;  Laterality: Bilateral;   TUBAL LIGATION Bilateral 03/20/2014   Procedure: BILATERAL TUBAL LIGATION;  Surgeon: Loney Laurence, MD;  Location: [facility];  Service: Obstetrics;  Laterality: Bilateral;   UNILATERAL SALPINGECTOMY Right 03/20/2014   Procedure: UNILATERAL SALPINGECTOMY;  Surgeon: Loney Laurence, MD;  Location: [facility];  Service: Obstetrics;  Laterality: Right;    Social History: Social History   Socioeconomic History   Marital status: Married    Spouse name: Not on file   Number of children: 3   Years of education: 2.5 yrs college   Highest education level: Not on file  Occupational History   Occupation: Production designer, theatre/television/film. Rep    Employer: [employer]  Tobacco Use   Smoking status: Never   Smokeless tobacco: Never  Vaping Use   Vaping status: Never Used  Substance and Sexual Activity   Alcohol use: No   Drug use: No   Sexual activity: Yes    Birth control/protection: Surgical  Other Topics Concern   Not on file  Social History Narrative   Lives at home with husband and three children.   Left-handed.   No caffeine use.       Social Determinants of Health   Financial Resource Strain: Not on file  Food Insecurity: Not on file  Transportation Needs: Not on file  Physical Activity: Not on file  Stress: Not on file  Social Connections: Unknown (01/18/2022)   Received from [facility], [facility]   Social Network    Social Network: Not on file  Intimate Partner Violence: Unknown (12/10/2021)   [facility], [facility]  HITS    Physically Hurt: Not on file    Insult or Talk Down To: Not on file    Threaten Physical Harm: Not on file    Scream or  Curse: Not on file    Family History: Family History  Problem Relation Age of Onset   Heart attack Father    Hypertension Father    Hyperlipidemia Father    Asthma Son    Heart disease Maternal Grandfather    Cancer Paternal Grandmother        unsure what kind    Review of Systems: Denies fevers or chills  Physical Exam: Vital Signs BP (!) 147/83 (BP Location: Left Arm, Patient Position: Sitting, Cuff Size: Large)   Pulse 62   LMP 05/26/2017 (Exact Date)   SpO2 98%   Physical Exam  Constitutional:      General: Not in acute distress.    Appearance: Normal appearance. Not ill-appearing.  HENT:     Head: Normocephalic and atraumatic.  Neck:     Musculoskeletal: Normal range of motion.  Cardiovascular:     Rate and Rhythm: Normal rate Pulmonary:     Effort: Pulmonary effort is normal. No respiratory distress.  Musculoskeletal: Normal range of motion.  Skin:    General: Skin is warm and dry.     Findings: No erythema or rash.  Neurological:     Mental Status: Alert and oriented to person, place, and time. Mental status is at baseline.  Psychiatric:        Mood and Affect: Mood normal.        Behavior: Behavior normal.    Assessment/Plan: The patient is scheduled for bilateral breast reduction with Dr. Ulice Bold.  Risks, benefits, and alternatives of procedure discussed, questions answered and consent obtained.    Smoking Status: Non-smoker; Counseling Given?  N/A Last Mammogram: 11/24/2022; Results: BI-RADS Category 1 negative  Caprini Score: 8; Risk Factors include: Age, BMI > 25, family history of thrombosis, swollen legs and length of planned surgery. Recommendation for mechanical and possible pharmacological prophylaxis. Encourage early ambulation.  Will discuss possibility of postoperative Lovenox with Dr. Ulice Bold.  Clearance sent to patient's cardiologist  Pictures obtained: @consult   Post-op Rx sent to pharmacy:  Zofran, doxycycline -patient states  she has history of MRSA and would like an antibiotic that covers MRSA.  Discussed with patient that we will send a clearance to her PCP to hold naltrexone while she takes oxycodone in the postoperative period.  Discussed with patient that once clearance is received from the PCP, I will send her an oxycodone.  Patient expressed understanding.  Instructed patient to hold any multivitamins or supplements at least 1 week prior to surgery and to hold zolmitriptan the day of surgery.  Patient expressed understanding.  Patient was provided with the breast reduction and General Surgical Risk consent document and Pain Medication Agreement prior to their appointment.  They had adequate time to read through the risk consent documents and Pain Medication Agreement. We also discussed them in person together during this preop appointment. All of their questions were answered to their satisfaction.  Recommended calling if they have any further questions.  Risk consent form and Pain Medication Agreement to be scanned into patient's chart.  The risk that can be encountered with breast reduction were discussed and include the following but not limited to these:  Breast asymmetry, fluid accumulation, firmness of the breast, inability to breast feed, loss of nipple or areola, skin loss, decrease or no nipple  sensation, fat necrosis of the breast tissue, bleeding, infection, healing delay.  There are risks of anesthesia, changes to skin sensation and injury to nerves or blood vessels.  The muscle can be temporarily or permanently injured.  You may have an allergic reaction to tape, suture, glue, blood products which can result in skin discoloration, swelling, pain, skin lesions, poor healing.  Any of these can lead to the need for revisonal surgery or stage procedures.  A reduction has potential to interfere with diagnostic procedures.  Nipple or breast piercing can increase risks of infection.  This procedure is best done when  the breast is fully developed.  Changes in the breast will continue to occur over time.  Pregnancy can alter the outcomes of previous breast reduction surgery, weight gain and weigh loss can also effect the long term appearance.   We discussed the possibility of amputation/free nipple graft technique due to the length of her STN.  She is understanding of the possibility that we would need to transition from a pedicle technique to a free nipple graft technique intraoperatively.  We discussed the risks associated with free nipple graft breast reductions, including but not limited to failure of the graft, partial loss of the graft, loss of sensation of bilateral nipple areola, complete loss of the nipple areola graft, inability to breast-feed, postoperative wounds, ongoing wound care.  We also discussed the risks associated with the pedicle technique.  We discussed that with the pedicle technique she could develop nipple areolar necrosis which would result in loss of the nipple, this would also result in ongoing wound care and possible changes in the shape of her breast.  The consent was obtained with risks and complications reviewed which included bleeding, pain, scar, infection and the risk of anesthesia.  The patients questions were answered to the patients expressed satisfaction.        Electronically signed by: Laurena Spies, PA-C 06/13/2023 4:10 PM

## 2023-06-14 NOTE — Telephone Encounter (Signed)
   Name: GWYNETH FERNANDEZ  DOB: October 18, 1970  MRN: [id number]  Primary Cardiologist: None  Chart reviewed as part of pre-operative protocol coverage. Because of Merryn Thaker Rorabaugh's past medical history and time since last visit, she will require a follow-up in-office visit in order to better assess preoperative cardiovascular risk. Last seen by Dr. Eden Emms on 07/08/2021   Pre-op covering staff: - Please schedule appointment and call patient to inform them. If patient already had an upcoming appointment within acceptable timeframe, please add "pre-op clearance" to the appointment notes so provider is aware. - Please contact requesting surgeon's office via preferred method (i.e, phone, fax) to inform them of need for appointment prior to surgery.   Joni Reining, NP  06/14/2023, 11:47 AM

## 2023-06-14 NOTE — Telephone Encounter (Signed)
Pt has been scheduled to see Joni Reining, NP, 06/20/23, clearance will be addressed at that time.  Will route to the requesting surgeon's office to make them aware.

## 2023-06-15 NOTE — Pre-Procedure Instructions (Addendum)
Surgical Instructions   Your procedure is scheduled on June 22, 2023. Report to [facility] Main Entrance "A" at 10:30 A.M., then check in with the Admitting office. Any questions or running late day of surgery: call [phone]  Questions prior to your surgery date: call [phone], Monday-Friday, 8am-4pm. If you experience any cold or flu symptoms such as cough, fever, chills, shortness of breath, etc. between now and your scheduled surgery, please notify us at the above number.     Remember:  Do not eat after midnight the night before your surgery   You may drink clear liquids until 9:30 AM the morning of your surgery.   Clear liquids allowed are: Water, Non-Citrus Juices (without pulp), Carbonated Beverages, Clear Tea, Black Coffee Only (NO MILK, CREAM OR POWDERED CREAMER of any kind), and Gatorade.    Take these medicines the morning of surgery with A SIP OF WATER: atenolol (TENORMIN)  fluticasone (FLONASE) nasal spray    May take these medicines IF NEEDED: acetaminophen (TYLENOL)  albuterol (PROVENTIL) nebulizer solution  albuterol (VENTOLIN HFA) inhaler  ipratropium-albuterol (DUONEB)    Follow your PRESCRIBER'S instructions on if/when to stop NALTREXONE.     One week prior to surgery, STOP taking any Aspirin (unless otherwise instructed by your surgeon) Aleve, Naproxen, Ibuprofen, Motrin, Advil, Goody's, BC's, all herbal medications, fish oil, and non-prescription vitamins.                     Do NOT Smoke (Tobacco/Vaping) for 24 hours prior to your procedure.  If you use a CPAP at night, you may bring your mask/headgear for your overnight stay.   You will be asked to remove any contacts, glasses, piercing's, hearing aid's, dentures/partials prior to surgery. Please bring cases for these items if needed.    Patients discharged the day of surgery will not be allowed to drive home, and someone needs to stay with them for 24 hours.  SURGICAL WAITING ROOM  VISITATION Patients may have no more than 2 support people in the waiting area - these visitors may rotate.   Pre-op nurse will coordinate an appropriate time for 1 ADULT support person, who may not rotate, to accompany patient in pre-op.  Children under the age of 52 must have an adult with them who is not the patient and must remain in the main waiting area with an adult.  If the patient needs to stay at the hospital during part of their recovery, the visitor guidelines for inpatient rooms apply.  Please refer to the [facility] website for the visitor guidelines for any additional information.   If you received a COVID test during your pre-op visit  it is requested that you wear a mask when out in public, stay away from anyone that may not be feeling well and notify your surgeon if you develop symptoms. If you have been in contact with anyone that has tested positive in the last 10 days please notify you surgeon.      Pre-operative CHG Bathing Instructions   You can play a key role in reducing the risk of infection after surgery. Your skin needs to be as free of germs as possible. You can reduce the number of germs on your skin by washing with CHG (chlorhexidine gluconate) soap before surgery. CHG is an antiseptic soap that kills germs and continues to kill germs even after washing.   DO NOT use if you have an allergy to chlorhexidine/CHG or antibacterial soaps. If your skin becomes  reddened or irritated, stop using the CHG and notify one of our RNs at [phone].              TAKE A SHOWER THE NIGHT BEFORE SURGERY AND THE DAY OF SURGERY    Please keep in mind the following:  DO NOT shave, including legs and underarms, 48 hours prior to surgery.   You may shave your face before/day of surgery.  Place clean sheets on your bed the night before surgery Use a clean washcloth (not used since being washed) for each shower. DO NOT sleep with pet's night before surgery.  CHG Shower  Instructions:  Wash your face and private area with normal soap. If you choose to wash your hair, wash first with your normal shampoo.  After you use shampoo/soap, rinse your hair and body thoroughly to remove shampoo/soap residue.  Turn the water OFF and apply half the bottle of CHG soap to a CLEAN washcloth.  Apply CHG soap ONLY FROM YOUR NECK DOWN TO YOUR TOES (washing for 3-5 minutes)  DO NOT use CHG soap on face, private areas, open wounds, or sores.  Pay special attention to the area where your surgery is being performed.  If you are having back surgery, having someone wash your back for you may be helpful. Wait 2 minutes after CHG soap is applied, then you may rinse off the CHG soap.  Pat dry with a clean towel  Put on clean pajamas    Additional instructions for the day of surgery: DO NOT APPLY any lotions, deodorants, cologne, or perfumes.   Do not wear jewelry or makeup Do not wear nail polish, gel polish, artificial nails, or any other type of covering on natural nails (fingers and toes) Do not bring valuables to the hospital. [facility] is not responsible for valuables/personal belongings. Put on clean/comfortable clothes.  Please brush your teeth.  Ask your nurse before applying any prescription medications to the skin.

## 2023-06-16 ENCOUNTER — Encounter (HOSPITAL_COMMUNITY): Payer: Self-pay

## 2023-06-16 ENCOUNTER — Encounter (HOSPITAL_COMMUNITY)
Admission: RE | Admit: 2023-06-16 | Discharge: 2023-06-16 | Disposition: A | Discharge status: Home / Self Care | Payer: 59 | Source: Ambulatory Visit | Attending: Plastic Surgery | Admitting: Plastic Surgery

## 2023-06-16 ENCOUNTER — Other Ambulatory Visit: Payer: Self-pay

## 2023-06-16 VITALS — BP 137/83 | HR 68 | Temp 98.4°F | Resp 18 | Ht 67.5 in | Wt 295.2 lb

## 2023-06-16 DIAGNOSIS — Z01812 Encounter for preprocedural laboratory examination: Secondary | ICD-10-CM | POA: Diagnosis present

## 2023-06-16 DIAGNOSIS — I712 Thoracic aortic aneurysm, without rupture, unspecified: Secondary | ICD-10-CM | POA: Diagnosis not present

## 2023-06-16 HISTORY — DX: Pneumonia, unspecified organism: J18.9

## 2023-06-16 HISTORY — DX: Unspecified asthma, uncomplicated: J45.909

## 2023-06-16 HISTORY — DX: Unspecified osteoarthritis, unspecified site: M19.90

## 2023-06-16 LAB — CBC
HCT: 43.2 % (ref 36.0–46.0)
Hemoglobin: 14.4 g/dL (ref 12.0–15.0)
MCH: 31.1 pg (ref 26.0–34.0)
MCHC: 33.3 g/dL (ref 30.0–36.0)
MCV: 93.3 fL (ref 80.0–100.0)
Platelets: 359 10*3/uL (ref 150–400)
RBC: 4.63 MIL/uL (ref 3.87–5.11)
RDW: 12.9 % (ref 11.5–15.5)
WBC: 9.4 10*3/uL (ref 4.0–10.5)
nRBC: 0 % (ref 0.0–0.2)

## 2023-06-16 LAB — BASIC METABOLIC PANEL
Anion gap: 11 (ref 5–15)
BUN: 10 mg/dL (ref 6–20)
CO2: 28 mmol/L (ref 22–32)
Calcium: 9.5 mg/dL (ref 8.9–10.3)
Chloride: 102 mmol/L (ref 98–111)
Creatinine, Ser: 0.9 mg/dL (ref 0.44–1.00)
GFR, Estimated: >60 mL/min (ref >60)
Glucose, Bld: 103 mg/dL — ABNORMAL HIGH (ref 70–99)
Potassium: 4.2 mmol/L (ref 3.5–5.1)
Sodium: 141 mmol/L (ref 135–145)

## 2023-06-16 NOTE — Progress Notes (Signed)
Cardiology Clinic Note   Patient Name: Sheryl [name] Date of Encounter: 06/20/2023  Primary Care Provider:  Laurann Montana, MD Primary Cardiologist:  Sheryl Haws, MD  Patient Profile    52 year old female with a history of aortic valve disease, with AAA followed by Sheryl [name], nonobstructive CAD per cath on 04/14/2018, atypical chest pain, chronic lower extremity edema.  Last seen in the office by Sheryl [name] on 07/08/2021.  Past Medical History    Past Medical History:  Diagnosis Date   Allergic rhinitis    Anemia    Arthritis    bilateral knees   Asthma    Atypical chest pain 01/07/2012   Ct chest 10/2011:  No acute process, no PE to midsized pulmonary vessels.     Chronic migraine    Migraines    Miscarriage 08/1996, 12/1996   x2   Normal labor 03/19/2014   Pneumonia    PONV (postoperative nausea and vomiting)    Postoperative state 03/20/2014   SVD (spontaneous vaginal delivery)    x 2   Vitamin D deficiency    Past Surgical History:  Procedure Laterality Date   ABDOMINAL HYSTERECTOMY     BREAST [name]     cyst removed from left breast; benign   CESAREAN SECTION N/A 03/20/2014   Procedure: CESAREAN SECTION;  Surgeon: Sheryl Laurence, MD;  Location: [facility];  Service: Obstetrics;  Laterality: N/A;   cyst removed from L breast  10/2006   benign   CYSTOSCOPY N/A 06/01/2017   Procedure: CYSTOSCOPY;  Surgeon: Sheryl Clamp, MD;  Location: [facility];  Service: Gynecology;  Laterality: N/A;   DILATION AND CURETTAGE OF UTERUS  08/1996, 12/1996   x 2 MAB   DILATION AND CURETTAGE OF UTERUS     KNEE ARTHROSCOPY Right 01/2016   KNEE ARTHROSCOPY Right 08/2016   LEFT HEART CATH AND CORONARY ANGIOGRAPHY N/A 04/14/2018   Procedure: LEFT HEART CATH AND CORONARY ANGIOGRAPHY;  Surgeon: Sheryl Kendall, MD;  Location: [facility];  Service: Cardiovascular;  Laterality: N/A;   live births  10/07/97, 02/08/2000   x2   ROBOTIC ASSISTED TOTAL HYSTERECTOMY WITH  SALPINGECTOMY Bilateral 06/01/2017   Procedure: ROBOTIC ASSISTED TOTAL HYSTERECTOMY WITH SALPINGECTOMY WITH PARTIAL REMOVAL OF HERNIA SAC;  Surgeon: Sheryl Clamp, MD;  Location: [facility];  Service: Gynecology;  Laterality: Bilateral;   TUBAL LIGATION Bilateral 03/20/2014   Procedure: BILATERAL TUBAL LIGATION;  Surgeon: Sheryl Laurence, MD;  Location: [facility];  Service: Obstetrics;  Laterality: Bilateral;   UMBILICAL HERNIA REPAIR     UNILATERAL SALPINGECTOMY Right 03/20/2014   Procedure: UNILATERAL SALPINGECTOMY;  Surgeon: Sheryl Laurence, MD;  Location: [facility];  Service: Obstetrics;  Laterality: Right;    Allergies  Allergies  Allergen Reactions   Aspirin Anaphylaxis and Hives   Salicylates Anaphylaxis and Hives    Unknown reaction Sulfur meds   Codeine Other (See Comments)    Migraines, anxiety    Fentanyl Nausea And Vomiting    Migraine    History of Present Illness    Sheryl [name] returns to the office today for ongoing assessment and management of nonobstructive CAD .  She is here for preoperative evaluation with plan breast reduction by Dr. Foster [name] with Sheryl [name] on 06/22/2023.  She comes today without any cardiac complaints.  She denies any chest pain, dyspnea on exertion, palpitations, PND, orthopnea, or profound fatigue.  She is working on weight loss as she does want to have bilateral knee replacement.  She is becoming more physically active and does a lot of swimming with her's grandson.  She is medically compliant.  Home Medications    Current Outpatient Medications  Medication Sig Dispense Refill   acetaminophen (TYLENOL) 500 MG tablet Take 500-1,000 mg by mouth every 6 (six) hours as needed (for pain/headaches.).     albuterol (PROVENTIL) (2.5 MG/3ML) 0.083% nebulizer solution Take 2.5 mg by nebulization every 6 (six) hours as needed for shortness of breath.     albuterol (VENTOLIN HFA) 108 (90 Base) MCG/ACT inhaler Inhale 2 puffs into the  lungs every 6 (six) hours as needed for wheezing or shortness of breath.     ascorbic acid (VITAMIN C) 500 MG tablet Take 500 mg by mouth daily.     atenolol (TENORMIN) 25 MG tablet Take 1 tablet (25 mg total) by mouth daily. 90 tablet 4   cholecalciferol (VITAMIN D3) 25 MCG (1000 UNIT) tablet Take 1,000 Units by mouth daily.     cyanocobalamin (VITAMIN B12) 1000 MCG tablet Take 1,000 mcg by mouth daily.     diphenhydrAMINE-zinc acetate (BENADRYL) cream Apply 1 Application topically 3 (three) times daily as needed for itching.     fexofenadine-pseudoephedrine (ALLEGRA-D 24) 180-240 MG 24 hr tablet Take 1 tablet by mouth daily.     fluticasone (FLONASE) 50 MCG/ACT nasal spray Place 2 sprays into both nostrils daily.     ipratropium-albuterol (DUONEB) 0.5-2.5 (3) MG/3ML SOLN Take 3 mLs by nebulization every 6 (six) hours as needed (severe shortness of breath).     Melatonin 10 MG TABS Take 10 mg by mouth at bedtime.     ondansetron (ZOFRAN) 4 MG tablet Take 1 tablet (4 mg total) by mouth every 8 (eight) hours as needed for up to 20 doses for nausea or vomiting. 20 tablet 0   zolmitriptan (ZOMIG) 5 MG tablet TAKE 1 TABLET BY MOUTH AT ONSET OF MIGRAINE. MAY REPEAT IN 2 HOURS IF NEEDED. MAX 2 IN 24 HOURS 12 tablet 11   No current facility-administered medications for this visit.     Family History    Family History  Problem Relation Age of Onset   Heart attack Father    Hypertension Father    Hyperlipidemia Father    Asthma Son    Heart disease Maternal Grandfather    Cancer Paternal Grandmother        unsure what kind   She indicated that her mother is alive. She indicated that her father is deceased. She indicated that the status of her maternal grandfather is unknown. She indicated that the status of her paternal grandmother is unknown. She indicated that the status of her son is unknown.  Social History    Social History   Socioeconomic History   Marital status: Married    Spouse  name: Not on file   Number of children: 3   Years of education: 2.5 yrs college   Highest education level: Not on file  Occupational History   Occupation: Production designer, theatre/television/film. Rep    Employer: [employer]  Tobacco Use   Smoking status: Never   Smokeless tobacco: Never  Vaping Use   Vaping status: Never Used  Substance and Sexual Activity   Alcohol use: No   Drug use: No   Sexual activity: Yes    Birth control/protection: Surgical  Other Topics Concern   Not on file  Social History Narrative   Lives at home with husband and three children.   Left-handed.   No  caffeine use.       Social Determinants of Health   Financial Resource Strain: Not on file  Food Insecurity: Not on file  Transportation Needs: Not on file  Physical Activity: Not on file  Stress: Not on file  Social Connections: Unknown (01/18/2022)   Received from [facility]   Social Network    Social Network: Not on file  Intimate Partner Violence: Unknown (12/10/2021)   Received from [facility], [facility]   HITS    Physically Hurt: Not on file    Insult or Talk Down To: Not on file    Threaten Physical Harm: Not on file    Scream or Curse: Not on file     Review of Systems    General:  No chills, fever, night sweats or weight changes.  Cardiovascular:  No chest pain, dyspnea on exertion, edema, orthopnea, palpitations, paroxysmal nocturnal dyspnea. Dermatological: No rash, lesions/masses Respiratory: No cough, dyspnea Urologic: No hematuria, dysuria Abdominal:   No nausea, vomiting, diarrhea, bright red blood per rectum, melena, or hematemesis Neurologic:  No visual changes, wkns, changes in mental status. All other systems reviewed and are otherwise negative except as noted above.  EKG Interpretation Date/Time:  Monday June 20 2023 08:52:58 EDT Ventricular Rate:  59 PR Interval:  140 QRS Duration:  88 QT Interval:  444 QTC Calculation: 439 R  Axis:   75  Text Interpretation: Sinus bradycardia When compared with ECG of 03-Feb-2023 03:52, PREVIOUS ECG IS PRESENT Confirmed by Joni Reining [phone]) on 06/20/2023 10:04:11 AM    Physical Exam    VS:  BP 90/66   Pulse (!) 57   Ht 5' 7.5" (1.715 m)   Wt 292 lb 9.6 oz (132.7 kg)   LMP 05/26/2017 (Exact Date)   SpO2 96%   BMI 45.15 kg/m  , BMI Body mass index is 45.15 kg/m.     GEN: Well nourished, well developed, in no acute distress.  Obese HEENT: normal. Neck: Supple, no JVD, carotid bruits, or masses. Cardiac: RRR, bradycardic, no murmurs, rubs, or gallops. No clubbing, cyanosis, edema.  Radials/DP/PT 2+ and equal bilaterally.  Respiratory:  Respirations regular and unlabored, clear to auscultation bilaterally. GI: Soft, nontender, nondistended, BS + x 4. MS: no deformity or atrophy. Skin: warm and dry, no rash. Neuro:  Strength and sensation are intact. Psych: Normal affect.  EKG Interpretation Date/Time:  Monday June 20 2023 08:52:58 EDT Ventricular Rate:  59 PR Interval:  140 QRS Duration:  88 QT Interval:  444 QTC Calculation: 439 R Axis:   75  Text Interpretation: Sinus bradycardia When compared with ECG of 03-Feb-2023 03:52, PREVIOUS ECG IS PRESENT Confirmed by Joni Reining [phone]) on 06/20/2023 10:04:11 AM   Lab Results  Component Value Date   WBC 9.4 06/16/2023   HGB 14.4 06/16/2023   HCT 43.2 06/16/2023   MCV 93.3 06/16/2023   PLT 359 06/16/2023   Lab Results  Component Value Date   CREATININE 0.90 06/16/2023   BUN 10 06/16/2023   NA 141 06/16/2023   K 4.2 06/16/2023   CL 102 06/16/2023   CO2 28 06/16/2023   Lab Results  Component Value Date   ALT 27 08/13/2021   AST 43 (H) 08/13/2021   ALKPHOS 55 08/13/2021   BILITOT 1.0 08/13/2021   Lab Results  Component Value Date   CHOL 168 04/26/2018   HDL 40 04/26/2018   LDLCALC 87 04/26/2018   TRIG 205 (H) 04/26/2018   CHOLHDL 4.2  04/26/2018    Lab Results  Component Value Date    HGBA1C  06/03/2010    5.5 (NOTE)                                                                       According to the ADA Clinical Practice Recommendations for 2011, when HbA1c is used as a screening test:   >=6.5%   Diagnostic of Diabetes Mellitus           (if abnormal result  is confirmed)  5.7-6.4%   Increased risk of developing Diabetes Mellitus  References:Diagnosis and Classification of Diabetes Mellitus,Diabetes Care,2011,34(Suppl 1):S62-S69 and Standards of Medical Care in         Diabetes - 2011,Diabetes Care,2011,34  (Suppl 1):S11-S61.     Review of Prior Studies EKG Interpretation Date/Time:  Monday June 20 2023 08:52:58 EDT Ventricular Rate:  59 PR Interval:  140 QRS Duration:  88 QT Interval:  444 QTC Calculation: 439 R Axis:   75  Text Interpretation: Sinus bradycardia When compared with ECG of 03-Feb-2023 03:52, PREVIOUS ECG IS PRESENT Confirmed by Joni Reining [phone]) on 06/20/2023 10:04:11 AM  [facility] 04/14/2018  Conclusions: No angiographically significant coronary artery disease. Normal left ventricular systolic function. Upper normal LVEDP.   Recommendations: Primary prevention of coronary artery disease. If chest pain recurs, consider empiric treatment for coronary vasospasm and/or evaluation for noncardiac etiologies of chest pain.  Assessment & Plan   1.  Pre-Operative Cardiac Evaluation:   According to the Revised Cardiac Risk Index (RCRI), her Perioperative Risk of Major Cardiac Event is (%): 0.4  Her Functional Capacity in METs is: 9.89 according to the Duke Activity Status Index (DASI).   Therefore, based on ACC/AHA guidelines, patient would be at acceptable risk for the planned procedure without further cardiovascular testing. I will route this recommendation to the requesting party via Epic fax function.   2.  Hypotension: Patient is on atenolol for migraines which causes her to have low blood pressure at baseline.  She states that this is  normal for her she denies any dizziness, near-syncope, or orthostatic symptoms.  This will need to be monitored closely perioperatively.         Signed, Bettey Mare. Liborio Nixon, ANP, AACC   06/20/2023 10:05 AM      Office [phone] Fax [phone]  Notice: This dictation was prepared with Dragon dictation along with smaller phrase technology. Any transcriptional errors that result from this process are unintentional and may not be corrected upon review.

## 2023-06-16 NOTE — Progress Notes (Signed)
PCP - Aram Beecham white Cardiologist - Joni Reining, NP/ peter nishan  PPM/ICD - denies  Chest x-ray - n/a EKG - 02/03/23 Stress Test -  ECHO - 06/03/10 Cardiac Cath - 04/14/18  Sleep Study - denies  Follow your PRESCRIBER'S instructions on if/when to stop NALTREXONE.  Pt states last dose was 06/13/23.     One week prior to surgery, STOP taking any Aspirin (unless otherwise instructed by your surgeon) Aleve, Naproxen, Ibuprofen, Motrin, Advil, Goody's, BC's, all herbal medications, fish oil, and non-prescription vitamins.  ERAS Protcol -yes PRE-SURGERY Ensure or G2- none ordered  COVID TEST- not needed   Anesthesia review: yes, cardiology clearance appt set for 06/20/23  Patient denies shortness of breath, fever, cough and chest pain at PAT appointment   All instructions explained to the patient, with a verbal understanding of the material. Patient agrees to go over the instructions while at home for a better understanding. Patient also instructed to self quarantine after being tested for COVID-19. The opportunity to ask questions was provided.

## 2023-06-20 ENCOUNTER — Encounter: Payer: Self-pay | Admitting: Adult Health

## 2023-06-20 ENCOUNTER — Ambulatory Visit: Payer: 59 | Attending: Adult Health | Admitting: Adult Health

## 2023-06-20 VITALS — BP 90/66 | HR 57 | Ht 67.5 in | Wt 292.6 lb

## 2023-06-20 DIAGNOSIS — Z01818 Encounter for other preprocedural examination: Secondary | ICD-10-CM | POA: Diagnosis not present

## 2023-06-20 DIAGNOSIS — I952 Hypotension due to drugs: Secondary | ICD-10-CM

## 2023-06-20 NOTE — Patient Instructions (Signed)
Medication Instructions:  No Changes *If you need a refill on your cardiac medications before your next appointment, please call your pharmacy*   Lab Work: No Labs If you have labs (blood work) drawn today and your tests are completely normal, you will receive your results only by: MyChart Message (if you have MyChart) OR A paper copy in the mail If you have any lab test that is abnormal or we need to change your treatment, we will call you to review the results.   Testing/Procedures: No Testing   Follow-Up: At [facility], you and your health needs are our priority.  As part of our continuing mission to provide you with exceptional heart care, we have created designated Provider Care Teams.  These Care Teams include your primary Cardiologist (physician) and Advanced Practice Providers (APPs -  Physician Assistants and Nurse Practitioners) who all work together to provide you with the care you need, when you need it.  We recommend signing up for the patient portal called "MyChart".  Sign up information is provided on this After Visit Summary.  MyChart is used to connect with patients for Virtual Visits (Telemedicine).  Patients are able to view lab/test results, encounter notes, upcoming appointments, etc.  Non-urgent messages can be sent to your provider as well.   To learn more about what you can do with MyChart, go to [email or web address].    Your next appointment:   1 year(s)  Provider:   Charlton Haws, MD

## 2023-06-22 ENCOUNTER — Other Ambulatory Visit: Payer: Self-pay

## 2023-06-22 ENCOUNTER — Observation Stay (HOSPITAL_COMMUNITY)
Admission: RE | Admit: 2023-06-22 | Discharge: 2023-06-23 | Disposition: A | Discharge status: Home / Self Care | Payer: 59 | Attending: Plastic Surgery | Admitting: Plastic Surgery

## 2023-06-22 ENCOUNTER — Ambulatory Visit (HOSPITAL_BASED_OUTPATIENT_CLINIC_OR_DEPARTMENT_OTHER): Payer: 59 | Admitting: Anesthesiology

## 2023-06-22 ENCOUNTER — Ambulatory Visit (HOSPITAL_COMMUNITY): Payer: 59 | Admitting: Physician Assistant

## 2023-06-22 ENCOUNTER — Encounter (HOSPITAL_COMMUNITY): Payer: Self-pay | Admitting: Plastic Surgery

## 2023-06-22 ENCOUNTER — Encounter (HOSPITAL_COMMUNITY)
Admission: RE | Disposition: A | Discharge status: Home / Self Care | Payer: Self-pay | Source: Home / Self Care | Attending: Plastic Surgery

## 2023-06-22 DIAGNOSIS — N62 Hypertrophy of breast: Principal | ICD-10-CM | POA: Insufficient documentation

## 2023-06-22 HISTORY — PX: BREAST REDUCTION SURGERY: SHX8

## 2023-06-22 LAB — HIV ANTIBODY (ROUTINE TESTING W REFLEX): HIV Screen 4th Generation wRfx: NONREACTIVE

## 2023-06-22 SURGERY — BREAST REDUCTION WITH LIPOSUCTION
Anesthesia: General | Site: Breast | Laterality: Bilateral

## 2023-06-22 MED ORDER — PROPOFOL 500 MG/50ML IV EMUL
INTRAVENOUS | Status: DC | PRN
Start: 2023-06-22 — End: 2023-06-22
  Administered 2023-06-22: 125 ug/kg/min via INTRAVENOUS

## 2023-06-22 MED ORDER — HYDROMORPHONE HCL 1 MG/ML IJ SOLN
1.0000 mg | INTRAMUSCULAR | Status: DC | PRN
  Administered 2023-06-22 – 2023-06-23 (×2): 1 mg via INTRAVENOUS
  Filled 2023-06-22 (×2): qty 1

## 2023-06-22 MED ORDER — NEOSTIGMINE METHYLSULFATE 3 MG/3ML IV SOSY
PREFILLED_SYRINGE | INTRAVENOUS | Status: AC
  Filled 2023-06-22: qty 3

## 2023-06-22 MED ORDER — SENNA 8.6 MG PO TABS
1.0000 | ORAL_TABLET | Freq: Two times a day (BID) | ORAL | Status: DC
  Administered 2023-06-22: 8.6 mg via ORAL
  Filled 2023-06-22: qty 1

## 2023-06-22 MED ORDER — SUCCINYLCHOLINE CHLORIDE 200 MG/10ML IV SOSY
PREFILLED_SYRINGE | INTRAVENOUS | Status: AC
  Filled 2023-06-22: qty 10

## 2023-06-22 MED ORDER — ONDANSETRON HCL 4 MG/2ML IJ SOLN
INTRAMUSCULAR | Status: AC
  Filled 2023-06-22: qty 2

## 2023-06-22 MED ORDER — BISACODYL 10 MG RE SUPP: 10.0000 mg | Freq: Every day | RECTAL | Status: DC | PRN

## 2023-06-22 MED ORDER — PROPOFOL 10 MG/ML IV BOLUS
INTRAVENOUS | Status: DC | PRN
  Administered 2023-06-22: 200 mg via INTRAVENOUS
  Administered 2023-06-22: 30 mg via INTRAVENOUS
  Administered 2023-06-22: 50 mg via INTRAVENOUS

## 2023-06-22 MED ORDER — ROCURONIUM BROMIDE 10 MG/ML (PF) SYRINGE
PREFILLED_SYRINGE | INTRAVENOUS | Status: AC
  Filled 2023-06-22: qty 10

## 2023-06-22 MED ORDER — CEFAZOLIN IN SODIUM CHLORIDE 3-0.9 GM/100ML-% IV SOLN
3.0000 g | INTRAVENOUS | Status: AC
  Administered 2023-06-22: 3 g via INTRAVENOUS
  Filled 2023-06-22: qty 100

## 2023-06-22 MED ORDER — ROCURONIUM BROMIDE 10 MG/ML (PF) SYRINGE
PREFILLED_SYRINGE | INTRAVENOUS | Status: DC | PRN
  Administered 2023-06-22: 70 mg via INTRAVENOUS

## 2023-06-22 MED ORDER — GLYCOPYRROLATE PF 0.2 MG/ML IJ SOSY
PREFILLED_SYRINGE | INTRAMUSCULAR | Status: AC
  Filled 2023-06-22: qty 1

## 2023-06-22 MED ORDER — DIPHENHYDRAMINE HCL 12.5 MG/5ML PO ELIX: 12.5000 mg | ORAL_SOLUTION | Freq: Four times a day (QID) | ORAL | Status: DC | PRN

## 2023-06-22 MED ORDER — CHLORHEXIDINE GLUCONATE 0.12 % MT SOLN
15.0000 mL | Freq: Once | OROMUCOSAL | Status: AC
  Administered 2023-06-22: 15 mL via OROMUCOSAL
  Filled 2023-06-22: qty 15

## 2023-06-22 MED ORDER — SODIUM CHLORIDE 0.9 % IV SOLN
Freq: Once | INTRAVENOUS | Status: DC
  Filled 2023-06-22: qty 10

## 2023-06-22 MED ORDER — HEMOSTATIC AGENTS (NO CHARGE) OPTIME
TOPICAL | Status: DC | PRN
Start: 2023-06-22 — End: 2023-06-22
  Administered 2023-06-22: 1 via TOPICAL

## 2023-06-22 MED ORDER — SODIUM CHLORIDE 0.9 % IV SOLN
INTRAVENOUS | Status: DC | PRN
  Administered 2023-06-22: 40 mL

## 2023-06-22 MED ORDER — PHENYLEPHRINE HCL-NACL 20-0.9 MG/250ML-% IV SOLN
INTRAVENOUS | Status: DC | PRN
  Administered 2023-06-22: 40 ug/min via INTRAVENOUS

## 2023-06-22 MED ORDER — ARTIFICIAL TEARS OPHTHALMIC OINT
TOPICAL_OINTMENT | OPHTHALMIC | Status: AC
  Filled 2023-06-22: qty 3.5

## 2023-06-22 MED ORDER — LIDOCAINE-EPINEPHRINE 1 %-1:100000 IJ SOLN
INTRAMUSCULAR | Status: AC
  Filled 2023-06-22: qty 1

## 2023-06-22 MED ORDER — HYDROMORPHONE HCL 1 MG/ML IJ SOLN
INTRAMUSCULAR | Status: AC
  Filled 2023-06-22: qty 0.5

## 2023-06-22 MED ORDER — MIDAZOLAM HCL 2 MG/2ML IJ SOLN
INTRAMUSCULAR | Status: DC | PRN
  Administered 2023-06-22: 2 mg via INTRAVENOUS

## 2023-06-22 MED ORDER — PROPOFOL 10 MG/ML IV BOLUS
INTRAVENOUS | Status: AC
  Filled 2023-06-22: qty 20

## 2023-06-22 MED ORDER — MIDAZOLAM HCL 2 MG/2ML IJ SOLN
INTRAMUSCULAR | Status: AC
  Filled 2023-06-22: qty 2

## 2023-06-22 MED ORDER — POLYETHYLENE GLYCOL 3350 17 G PO PACK: 17.0000 g | PACK | Freq: Every day | ORAL | Status: DC | PRN

## 2023-06-22 MED ORDER — BUPIVACAINE-EPINEPHRINE (PF) 0.25% -1:200000 IJ SOLN
INTRAMUSCULAR | Status: AC
  Filled 2023-06-22: qty 30

## 2023-06-22 MED ORDER — SODIUM CHLORIDE 0.9 % IV SOLN
INTRAVENOUS | Status: DC | PRN
Start: 2023-06-22 — End: 2023-06-22

## 2023-06-22 MED ORDER — FENTANYL CITRATE (PF) 250 MCG/5ML IJ SOLN
INTRAMUSCULAR | Status: AC
  Filled 2023-06-22: qty 5

## 2023-06-22 MED ORDER — BUPIVACAINE LIPOSOME 1.3 % IJ SUSP
INTRAMUSCULAR | Status: AC
  Filled 2023-06-22: qty 20

## 2023-06-22 MED ORDER — EPHEDRINE 5 MG/ML INJ
INTRAVENOUS | Status: AC
  Filled 2023-06-22: qty 5

## 2023-06-22 MED ORDER — LACTATED RINGERS IV SOLN: INTRAVENOUS | Status: DC

## 2023-06-22 MED ORDER — BUPIVACAINE HCL (PF) 0.25 % IJ SOLN
INTRAMUSCULAR | Status: AC
  Filled 2023-06-22: qty 30

## 2023-06-22 MED ORDER — SODIUM BICARBONATE 4.2 % IV SOLN
Freq: Once | INTRAVENOUS | Status: AC
  Administered 2023-06-22: 250 mL via INTRAMUSCULAR
  Filled 2023-06-22 (×2): qty 50

## 2023-06-22 MED ORDER — VASHE WOUND IRRIGATION OPTIME
TOPICAL | Status: DC | PRN
Start: 2023-06-22 — End: 2023-06-22
  Administered 2023-06-22: 34 [oz_av]

## 2023-06-22 MED ORDER — CHLORHEXIDINE GLUCONATE CLOTH 2 % EX PADS: 6.0000 | MEDICATED_PAD | Freq: Once | CUTANEOUS | Status: DC

## 2023-06-22 MED ORDER — KCL IN DEXTROSE-NACL 20-5-0.45 MEQ/L-%-% IV SOLN
INTRAVENOUS | Status: DC
  Filled 2023-06-22: qty 1000

## 2023-06-22 MED ORDER — ONDANSETRON 4 MG PO TBDP: 4.0000 mg | ORAL_TABLET | Freq: Four times a day (QID) | ORAL | Status: DC | PRN

## 2023-06-22 MED ORDER — CEFAZOLIN SODIUM-DEXTROSE 2-4 GM/100ML-% IV SOLN
2.0000 g | Freq: Three times a day (TID) | INTRAVENOUS | Status: DC
  Administered 2023-06-22 – 2023-06-23 (×2): 2 g via INTRAVENOUS
  Filled 2023-06-22 (×2): qty 100

## 2023-06-22 MED ORDER — LIDOCAINE-EPINEPHRINE 1 %-1:100000 IJ SOLN
INTRAMUSCULAR | Status: DC | PRN
  Administered 2023-06-22: 40 mL

## 2023-06-22 MED ORDER — HYDROMORPHONE HCL 1 MG/ML IJ SOLN
INTRAMUSCULAR | Status: AC
  Filled 2023-06-22: qty 1

## 2023-06-22 MED ORDER — PHENYLEPHRINE 80 MCG/ML (10ML) SYRINGE FOR IV PUSH (FOR BLOOD PRESSURE SUPPORT)
PREFILLED_SYRINGE | INTRAVENOUS | Status: AC
  Filled 2023-06-22: qty 10

## 2023-06-22 MED ORDER — HYDROMORPHONE HCL 1 MG/ML IJ SOLN
INTRAMUSCULAR | Status: DC | PRN
Start: 2023-06-22 — End: 2023-06-22
  Administered 2023-06-22 (×3): .5 mg via INTRAVENOUS

## 2023-06-22 MED ORDER — ORAL CARE MOUTH RINSE: 15.0000 mL | Freq: Once | OROMUCOSAL | Status: AC

## 2023-06-22 MED ORDER — LIDOCAINE 2% (20 MG/ML) 5 ML SYRINGE
INTRAMUSCULAR | Status: DC | PRN
  Administered 2023-06-22: 100 mg via INTRAVENOUS

## 2023-06-22 MED ORDER — HYDROMORPHONE HCL 1 MG/ML IJ SOLN
0.2500 mg | INTRAMUSCULAR | Status: DC | PRN
  Administered 2023-06-22 (×2): 0.5 mg via INTRAVENOUS

## 2023-06-22 MED ORDER — LIDOCAINE 2% (20 MG/ML) 5 ML SYRINGE
INTRAMUSCULAR | Status: AC
  Filled 2023-06-22: qty 5

## 2023-06-22 MED ORDER — OXYCODONE HCL 5 MG PO TABS
5.0000 mg | ORAL_TABLET | ORAL | Status: DC | PRN
  Administered 2023-06-23: 10 mg via ORAL
  Filled 2023-06-22: qty 2

## 2023-06-22 MED ORDER — ONDANSETRON HCL 4 MG/2ML IJ SOLN
INTRAMUSCULAR | Status: DC | PRN
  Administered 2023-06-22: 4 mg via INTRAVENOUS

## 2023-06-22 MED ORDER — PHENYLEPHRINE 80 MCG/ML (10ML) SYRINGE FOR IV PUSH (FOR BLOOD PRESSURE SUPPORT)
PREFILLED_SYRINGE | INTRAVENOUS | Status: DC | PRN
  Administered 2023-06-22 (×2): 80 ug via INTRAVENOUS

## 2023-06-22 MED ORDER — SCOPOLAMINE 1 MG/3DAYS TD PT72
1.0000 | MEDICATED_PATCH | Freq: Once | TRANSDERMAL | Status: DC
  Administered 2023-06-22: 1.5 mg via TRANSDERMAL
  Filled 2023-06-22: qty 1

## 2023-06-22 MED ORDER — ACETAMINOPHEN 500 MG PO TABS
500.0000 mg | ORAL_TABLET | Freq: Four times a day (QID) | ORAL | Status: DC
  Administered 2023-06-22 – 2023-06-23 (×3): 500 mg via ORAL
  Filled 2023-06-22 (×3): qty 1

## 2023-06-22 MED ORDER — DROPERIDOL 2.5 MG/ML IJ SOLN: 0.6250 mg | Freq: Once | INTRAMUSCULAR | Status: DC | PRN

## 2023-06-22 MED ORDER — DEXAMETHASONE SODIUM PHOSPHATE 10 MG/ML IJ SOLN
INTRAMUSCULAR | Status: AC
  Filled 2023-06-22: qty 1

## 2023-06-22 MED ORDER — ACETAMINOPHEN 500 MG PO TABS
1000.0000 mg | ORAL_TABLET | Freq: Once | ORAL | Status: AC
  Administered 2023-06-22: 1000 mg via ORAL
  Filled 2023-06-22: qty 2

## 2023-06-22 MED ORDER — ONDANSETRON HCL 4 MG/2ML IJ SOLN
4.0000 mg | Freq: Four times a day (QID) | INTRAMUSCULAR | Status: DC | PRN
  Administered 2023-06-22: 4 mg via INTRAVENOUS
  Filled 2023-06-22: qty 2

## 2023-06-22 MED ORDER — DIPHENHYDRAMINE HCL 50 MG/ML IJ SOLN: 12.5000 mg | Freq: Four times a day (QID) | INTRAMUSCULAR | Status: DC | PRN

## 2023-06-22 SURGICAL SUPPLY — 61 items
ADH SKN CLS APL DERMABOND .7 (GAUZE/BANDAGES/DRESSINGS) ×2
AGENT HMST PWDR BTL CLGN 5GM (Miscellaneous) ×1 IMPLANT
BAG COUNTER SPONGE SURGICOUNT (BAG) ×1 IMPLANT
BAG SPNG CNTER NS LX DISP (BAG) ×1
BALL CTTN LRG ABS STRL LF (GAUZE/BANDAGES/DRESSINGS) ×2
BINDER BREAST XXLRG (GAUZE/BANDAGES/DRESSINGS) IMPLANT
BIOPATCH RED 1 DISK 7.0 (GAUZE/BANDAGES/DRESSINGS) IMPLANT
BLADE SURG 10 STRL SS (BLADE) ×1 IMPLANT
BNDG GAUZE DERMACEA FLUFF 4 (GAUZE/BANDAGES/DRESSINGS) IMPLANT
BNDG GZE DERMACEA 4 6PLY (GAUZE/BANDAGES/DRESSINGS)
CANISTER SUCT 3000ML PPV (MISCELLANEOUS) ×1 IMPLANT
COLLAGEN CELLERATERX 5 GRAM (Miscellaneous) IMPLANT
COTTONBALL LRG STERILE PKG (GAUZE/BANDAGES/DRESSINGS) IMPLANT
DERMABOND ADVANCED .7 DNX12 (GAUZE/BANDAGES/DRESSINGS) ×3 IMPLANT
DRAIN CHANNEL 19F RND (DRAIN) IMPLANT
DRAPE CHEST BREAST 15X10 FENES (DRAPES) IMPLANT
DRSG MEPILEX POST OP 4X8 (GAUZE/BANDAGES/DRESSINGS) ×2 IMPLANT
DRSG TEGADERM 4X4.75 (GAUZE/BANDAGES/DRESSINGS) IMPLANT
ELECT BLADE 4.0 EZ CLEAN MEGAD (MISCELLANEOUS) ×2
ELECT CAUTERY BLADE 6.4 (BLADE) ×1 IMPLANT
ELECT REM PT RETURN 9FT ADLT (ELECTROSURGICAL) ×1
ELECTRODE BLDE 4.0 EZ CLN MEGD (MISCELLANEOUS) ×1 IMPLANT
ELECTRODE REM PT RTRN 9FT ADLT (ELECTROSURGICAL) ×1 IMPLANT
EVACUATOR SILICONE 100CC (DRAIN) IMPLANT
GAUZE PAD ABD 8X10 STRL (GAUZE/BANDAGES/DRESSINGS) ×2 IMPLANT
GAUZE SPONGE 4X4 12PLY STRL (GAUZE/BANDAGES/DRESSINGS) IMPLANT
GAUZE XEROFORM 5X9 LF (GAUZE/BANDAGES/DRESSINGS) IMPLANT
GLOVE BIO SURGEON STRL SZ 6.5 (GLOVE) ×2 IMPLANT
GLOVE SURG ENC TEXT LTX SZ7.5 (GLOVE) IMPLANT
GOWN STRL REUS W/ TWL LRG LVL3 (GOWN DISPOSABLE) ×2 IMPLANT
GOWN STRL REUS W/TWL LRG LVL3 (GOWN DISPOSABLE) ×2
HEMOSTAT ARISTA ABSORB 3G PWDR (HEMOSTASIS) IMPLANT
MARKER SKIN DUAL TIP RULER LAB (MISCELLANEOUS) ×1 IMPLANT
NDL HYPO 25GX1X1/2 BEV (NEEDLE) ×1 IMPLANT
NEEDLE HYPO 25GX1X1/2 BEV (NEEDLE) ×1
NS IRRIG 1000ML POUR BTL (IV SOLUTION) ×1 IMPLANT
PACK GENERAL/GYN (CUSTOM PROCEDURE TRAY) ×1 IMPLANT
PAD FOAM SILICONE BACKED (GAUZE/BANDAGES/DRESSINGS) IMPLANT
SPIKE FLUID TRANSFER (MISCELLANEOUS) IMPLANT
SPONGE T-LAP 18X18 ~~LOC~~+RFID (SPONGE) ×2 IMPLANT
STAPLER VISISTAT 35W (STAPLE) IMPLANT
STRIP CLOSURE SKIN 1/2X4 (GAUZE/BANDAGES/DRESSINGS) ×2 IMPLANT
SUT MNCRL AB 3-0 PS2 27 (SUTURE) IMPLANT
SUT MON AB 3-0 SH 27 (SUTURE)
SUT MON AB 3-0 SH27 (SUTURE) IMPLANT
SUT MON AB 4-0 PS1 27 (SUTURE) IMPLANT
SUT MON AB 5-0 PS2 18 (SUTURE) IMPLANT
SUT PDS AB 2-0 CT2 27 (SUTURE) IMPLANT
SUT PDS AB 3-0 SH 27 (SUTURE) IMPLANT
SUT SILK 3 0 PS 1 (SUTURE) IMPLANT
SUT SILK 4 0 PS 2 (SUTURE) IMPLANT
SUT VIC AB 3-0 SH 27 (SUTURE)
SUT VIC AB 3-0 SH 27X BRD (SUTURE) IMPLANT
SUT VIC AB 4-0 PS2 18 (SUTURE) IMPLANT
SUT VICRYL 4-0 PS2 18IN ABS (SUTURE) IMPLANT
SYR 50ML LL SCALE MARK (SYRINGE) IMPLANT
SYR CONTROL 10ML LL (SYRINGE) ×1 IMPLANT
TOWEL GREEN STERILE FF (TOWEL DISPOSABLE) ×2 IMPLANT
TRAY CATH INTERMITTENT SS 16FR (CATHETERS) IMPLANT
TUBING INFILTRATION IT-10001 (TUBING) IMPLANT
TUBING SET GRADUATE ASPIR 12FT (MISCELLANEOUS) IMPLANT

## 2023-06-22 NOTE — Transfer of Care (Signed)
Immediate Anesthesia Transfer of Care Note  Patient: Sheryl [name]  Procedure(s) Performed: BILATERAL BREAST REDUCTION WITH LIPOSUCTION (Bilateral: Breast)  Patient Location: PACU  Anesthesia Type:General  Level of Consciousness: awake, alert , and oriented  Airway & Oxygen Therapy: Patient Spontanous Breathing and Patient connected to face mask oxygen  Post-op Assessment: Report given to RN and Post -op Vital signs reviewed and stable  Post vital signs: Reviewed and stable  Last Vitals:  Vitals Value Taken Time  BP 113/70 06/22/23 1830  Temp 36.4 C 06/22/23 1805  Pulse 57 06/22/23 1836  Resp 14 06/22/23 1836  SpO2 100 % 06/22/23 1836  Vitals shown include unfiled device data.  Last Pain:  Vitals:   06/22/23 1050  PainSc: 1       Patients Stated Pain Goal: 0 (06/22/23 1050)  Complications: No notable events documented.

## 2023-06-22 NOTE — Op Note (Signed)
Breast Reduction Op note:    DATE OF PROCEDURE: 06/22/2023  LOCATION: [facility]  SURGEON: Foster Simpson, DO  ASSISTANT: Caroline More, PA  PREOPERATIVE DIAGNOSIS 1. Macromastia 2. Neck Pain 3. Back Pain  POSTOPERATIVE DIAGNOSIS 1. Macromastia 2. Neck Pain 3. Back Pain  PROCEDURES 1. Bilateral breast reduction.  Right reduction 1200 g, Left reduction 1400 g 2. Amputation technique with grafting of the nipple areola onto the breast bilaterally  COMPLICATIONS: None.  DRAINS: #19 JP drains bilaterally  INDICATIONS FOR PROCEDURE Sheryl [name] is a 52 y.o. year-old female born on 1971-06-02,with a history of symptomatic macromastia with concominant back pain, neck pain, shoulder grooving from her bra.   MRN: [id number]  CONSENT Informed consent was obtained directly from the patient. The risks, benefits and alternatives were fully discussed. Specific risks including but not limited to bleeding, infection, hematoma, seroma, scarring, pain, nipple necrosis, asymmetry, poor cosmetic results, and need for further surgery were discussed. The patient's questions were answered.  DESCRIPTION OF PROCEDURE  Patient was brought into the operating room and rested on the operating room table in the supine position.  SCDs were placed and appropriate padding was performed.  Antibiotics were given. The patient underwent general anesthesia and the chest was prepped and draped in a sterile fashion.  A timeout was performed and all information was confirmed to be correct by those in the room. Tumescent was placed in the lateral breast and liposuction done for improved contour and symmetry.  Right side: Preoperative markings were confirmed.  Incision lines were injected with local containing epinephrine.  After waiting for vasoconstriction, the marked lines were incised with a #15 blade.  A Wise-pattern superomedial breast reduction was started by de-epithelializing the pedicle,  using bovie to create the superomedial pedicle, and removing breast tissue from the superior, lateral, and inferior portions of the breast.  Care was taken to not undermine the breast pedicle. Hemostasis was achieved.  The nipple was gently rotated into position and the soft tissue closed with 4-0 Monocryl.  The complex looks stressed so we converted to an amputation technique which was discussed with the patient and was a part of the plan. The complex was removed and de-fatted.   A xeroform bolster was used to secure the graft in place. The pocket was irrigated and hemostasis confirmed.  The deep tissues were approximated with 3-0 PDS sutures. The complex was then grafted into place and secured with the 4-0 Vicryl for bolsters.  The 4-0 Monocryl was used to secure the skin edges. Cellerate, Arista and Experel were placed in the pocket. The skin was closed with deep dermal 3-0 Monocryl and subcuticular 4-0 Monocryl sutures.     Left side: Right side: Preoperative markings were confirmed.  Incision lines were injected with local containing epinephrine.  After waiting for vasoconstriction, the marked lines were incised with a #15 blade.  A Wise-pattern superomedial breast reduction was started by de-epithelializing the pedicle, using bovie to create the superomedial pedicle, and removing breast tissue from the superior, lateral, and inferior portions of the breast.  Care was taken to not undermine the breast pedicle. Hemostasis was achieved.  The complex was removed and de-fatted.   The pocket was irrigated and hemostasis confirmed.  The deep tissues were approximated with 3-0 PDS sutures. The complex was then grafted into place and secured with the 4-0 Vicryl for bolsters.  The 4-0 Monocryl was used to secure the skin edges. A xeroform bolster was used to secure the  graft in place.  Cellerate, Arista and Experel were placed in the pocket. The skin was closed with deep dermal 3-0 Monocryl and subcuticular 4-0  Monocryl sutures.  The nipple and skin flaps had good capillary refill at the end of the procedure.  Dermabond was applied.  A breast binder and ABDs were placed.  The patient tolerated the procedure well. The patient was allowed to wake from anesthesia and taken to the recovery room in satisfactory condition.  The advanced practice practitioner (APP) assisted throughout the case.  The APP was essential in retraction and counter traction when needed to make the case progress smoothly.  This retraction and assistance made it possible to see the tissue plans for the procedure.  The assistance was needed for blood control, tissue re-approximation and assisted with closure of the incision site.

## 2023-06-22 NOTE — Interval H&P Note (Signed)
History and Physical Interval Note:  06/22/2023 12:19 PM  Sheryl [name]  has presented today for surgery, with the diagnosis of Macromastia.  The various methods of treatment have been discussed with the patient and family. After consideration of risks, benefits and other options for treatment, the patient has consented to  Procedure(s): BREAST REDUCTION WITH LIPOSUCTION (Bilateral) as a surgical intervention.  The patient's history has been reviewed, patient examined, no change in status, stable for surgery.  I have reviewed the patient's chart and labs.  Questions were answered to the patient's satisfaction.     Alena Bills Gerene Nedd

## 2023-06-22 NOTE — Discharge Instructions (Signed)

## 2023-06-22 NOTE — Anesthesia Preprocedure Evaluation (Addendum)
Anesthesia Evaluation  Patient identified by MRN, date of birth, ID band Patient awake    Reviewed: Allergy & Precautions, NPO status , Patient's Chart, lab work & pertinent test results  History of Anesthesia Complications (+) PONV and history of anesthetic complications  Airway Mallampati: II  TM Distance: >3 FB Neck ROM: Full    Dental  (+) Missing,    Pulmonary asthma    Pulmonary exam normal        Cardiovascular negative cardio ROS Normal cardiovascular exam     Neuro/Psych  Headaches    GI/Hepatic negative GI ROS, Neg liver ROS,,,  Endo/Other    Morbid obesity  Renal/GU negative Renal ROS     Musculoskeletal  (+) Arthritis ,    Abdominal   Peds  Hematology negative hematology ROS (+)   Anesthesia Other Findings Macromastia  Reproductive/Obstetrics                             Anesthesia Physical Anesthesia Plan  ASA: 3  Anesthesia Plan: General   Post-op Pain Management: Tylenol PO (pre-op)*, Toradol IV (intra-op)* and Dilaudid IV   Induction: Intravenous  PONV Risk Score and Plan: 4 or greater and Treatment may vary due to age or medical condition, Ondansetron, Dexamethasone, Midazolam and Scopolamine patch - Pre-op  Airway Management Planned: Oral ETT  Additional Equipment: None  Intra-op Plan:   Post-operative Plan: Extubation in OR  Informed Consent: I have reviewed the patients History and Physical, chart, labs and discussed the procedure including the risks, benefits and alternatives for the proposed anesthesia with the patient or authorized representative who has indicated his/her understanding and acceptance.     Dental advisory given  Plan Discussed with: CRNA  Anesthesia Plan Comments:        Anesthesia Quick Evaluation

## 2023-06-22 NOTE — Anesthesia Procedure Notes (Signed)
Procedure Name: Intubation Date/Time: 06/22/2023 2:13 PM  Performed by: Garfield Cornea, CRNAPre-anesthesia Checklist: Patient identified, Emergency Drugs available, Suction available and Patient being monitored Patient Re-evaluated:Patient Re-evaluated prior to induction Oxygen Delivery Method: Circle System Utilized Preoxygenation: Pre-oxygenation with 100% oxygen Induction Type: IV induction Ventilation: Mask ventilation without difficulty and Oral airway inserted - appropriate to patient size Laryngoscope Size: Mac and 3 Grade View: Grade I Tube type: Oral Tube size: 7.0 mm Number of attempts: 1 Airway Equipment and Method: Stylet and Oral airway Placement Confirmation: ETT inserted through vocal cords under direct vision, positive ETCO2 and breath sounds checked- equal and bilateral Secured at: 22 cm Tube secured with: Tape Dental Injury: Teeth and Oropharynx as per pre-operative assessment

## 2023-06-23 ENCOUNTER — Encounter (HOSPITAL_COMMUNITY): Payer: Self-pay | Admitting: Plastic Surgery

## 2023-06-23 DIAGNOSIS — N62 Hypertrophy of breast: Secondary | ICD-10-CM | POA: Diagnosis not present

## 2023-06-23 MED ORDER — OXYCODONE HCL 5 MG PO TABS: 5.0000 mg | ORAL_TABLET | Freq: Four times a day (QID) | ORAL | 0 refills | Status: DC | PRN

## 2023-06-23 NOTE — Discharge Summary (Signed)
Physician Discharge Summary  Patient ID: Sheryl [name] MRN: [id number] DOB/AGE: 1971/06/02 52 y.o.  Admit date: 06/22/2023 Discharge date: 06/23/2023  Admission Diagnoses:  Discharge Diagnoses:  Principal Problem:   Symptomatic mammary hypertrophy   Discharged Condition: good  Hospital Course: Patient is a 52 year old female with history of macromastia.  She underwent bilateral breast reduction with Dr. Ulice Bold yesterday 06/22/2023.  Patient was admitted to the floor for pain control and observation.  Intraoperatively, amputation technique was utilized and drains were placed.  Patient is postop day 1.    Today, patient is sitting upright in her bed eating breakfast upon entering the room.  Patient states that she is doing really well.  She is did state that she had 1 episode of vomiting last night, which is normal for her after anesthesia.  She states otherwise she is doing well.  She states that she has been up and ambulating.  She reports that her pain overall has been controlled with medications.  She denies any acute events overnight.  She denies any fevers or chills.  Patient states that she is ready to go home today.  Patient also notes that she is very happy with her results so far and already notices relief and shoulder pain.  Consults: None  Significant Diagnostic Studies: N/A  Treatments: surgery: Bilateral breast reduction  Discharge Exam: Blood pressure 110/67, pulse 65, temperature 98.9 F (37.2 C), resp. rate 20, height 5\' 7"  (1.702 m), weight 132.5 kg, last menstrual period 05/26/2017, SpO2 100%, unknown if currently breastfeeding. General appearance: alert, cooperative, and no distress Resp: Unlabored breathing, no respiratory distress Breasts: Breasts are soft and symmetric bilaterally.  There is no overlying erythema.  Bolsters are in placed over the NAC's bilaterally.  There are no fluid collections noted on exam.  Mepilex border dressings are clean dry and intact.   JP drains are intact and functioning bilaterally.  There is serosanguineous drainage in each of the bulbs.  Disposition: Discharge disposition: 01-Home or Self Care      Patient to be discharged home.  We discussed that she cannot get the bolsters wet.  All of her questions were answered to her satisfaction.  Patient also states that she is no longer taking naltrexone.  Discussed with her that I will send her an oxycodone given that she is no longer taking naltrexone.  Discussed with her to always take Tylenol or ibuprofen first for pain and then only take oxycodone if needed.  Patient expressed understanding.  Instructed patient to call her clinic if she has any questions or concerns about anything. Discharge Instructions     (HEART FAILURE PATIENTS) Call MD:  Anytime you have any of the following symptoms: 1) 3 pound weight gain in 24 hours or 5 pounds in 1 week 2) shortness of breath, with or without a dry hacking cough 3) swelling in the hands, feet or stomach 4) if you have to sleep on extra pillows at night in order to breathe.   Complete by: As directed    Call MD for:  difficulty breathing, headache or visual disturbances   Complete by: As directed    Call MD for:  extreme fatigue   Complete by: As directed    Call MD for:  hives   Complete by: As directed    Call MD for:  persistant dizziness or light-headedness   Complete by: As directed    Call MD for:  persistant nausea and vomiting   Complete by: As directed  Call MD for:  redness, tenderness, or signs of infection (pain, swelling, redness, odor or green/yellow discharge around incision site)   Complete by: As directed    Call MD for:  severe uncontrolled pain   Complete by: As directed    Call MD for:  temperature >100.4   Complete by: As directed    Diet - low sodium heart healthy   Complete by: As directed    Increase activity slowly   Complete by: As directed       Allergies as of 06/23/2023        Reactions   Aspirin Anaphylaxis, Hives   Salicylates Anaphylaxis, Hives   Unknown reaction Sulfur meds   Codeine Other (See Comments)   Migraines, anxiety    Fentanyl Nausea And Vomiting   Migraine        Medication List     TAKE these medications    acetaminophen 500 MG tablet Commonly known as: TYLENOL Take 500-1,000 mg by mouth every 6 (six) hours as needed (for pain/headaches.).   albuterol (2.5 MG/3ML) 0.083% nebulizer solution Commonly known as: PROVENTIL Take 2.5 mg by nebulization every 6 (six) hours as needed for shortness of breath.   albuterol 108 (90 Base) MCG/ACT inhaler Commonly known as: VENTOLIN HFA Inhale 2 puffs into the lungs every 6 (six) hours as needed for wheezing or shortness of breath.   ascorbic acid 500 MG tablet Commonly known as: VITAMIN C Take 500 mg by mouth daily.   atenolol 25 MG tablet Commonly known as: TENORMIN Take 1 tablet (25 mg total) by mouth daily.   cholecalciferol 25 MCG (1000 UNIT) tablet Commonly known as: VITAMIN D3 Take 1,000 Units by mouth daily.   cyanocobalamin 1000 MCG tablet Commonly known as: VITAMIN B12 Take 1,000 mcg by mouth daily.   diphenhydrAMINE-zinc acetate cream Commonly known as: BENADRYL Apply 1 Application topically 3 (three) times daily as needed for itching.   fexofenadine-pseudoephedrine 180-240 MG 24 hr tablet Commonly known as: ALLEGRA-D 24 Take 1 tablet by mouth daily.   fluticasone 50 MCG/ACT nasal spray Commonly known as: FLONASE Place 2 sprays into both nostrils daily.   ipratropium-albuterol 0.5-2.5 (3) MG/3ML Soln Commonly known as: DUONEB Take 3 mLs by nebulization every 6 (six) hours as needed (severe shortness of breath).   Melatonin 10 MG Tabs Take 10 mg by mouth at bedtime.   ondansetron 4 MG tablet Commonly known as: Zofran Take 1 tablet (4 mg total) by mouth every 8 (eight) hours as needed for up to 20 doses for nausea or vomiting.   oxyCODONE 5 MG immediate release  tablet Commonly known as: Roxicodone Take 1 tablet (5 mg total) by mouth every 6 (six) hours as needed for up to 20 doses for severe pain (pain score 7-10).   zolmitriptan 5 MG tablet Commonly known as: ZOMIG TAKE 1 TABLET BY MOUTH AT ONSET OF MIGRAINE. MAY REPEAT IN 2 HOURS IF NEEDED. MAX 2 IN 24 HOURS        Follow-up Information     Dillingham, Alena Bills, DO Follow up in 10 day(s).   Specialty: Plastic Surgery Contact information: [address] [place] [address] [place] [address] [phone]                 [facility] [address] [place], [place] [address] [phone]  Signed: Laurena Spies 06/23/2023, 7:38 AM

## 2023-06-23 NOTE — Plan of Care (Signed)
  Problem: Education: Goal: Knowledge of General Education information will improve Description: Including pain rating scale, medication(s)/side effects and non-pharmacologic comfort measures Outcome: Progressing   Problem: Clinical Measurements: Goal: Ability to maintain clinical measurements within normal limits will improve Outcome: Progressing Goal: Cardiovascular complication will be avoided Outcome: Progressing   Problem: Activity: Goal: Risk for activity intolerance will decrease Outcome: Progressing   Problem: Coping: Goal: Level of anxiety will decrease Outcome: Progressing   Problem: Elimination: Goal: Will not experience complications related to bowel motility Outcome: Progressing

## 2023-06-23 NOTE — Anesthesia Postprocedure Evaluation (Signed)
Anesthesia Post Note  Patient: Sheryl [name]  Procedure(s) Performed: BILATERAL BREAST REDUCTION WITH LIPOSUCTION (Bilateral: Breast)     Patient location during evaluation: PACU Anesthesia Type: General Level of consciousness: awake and alert Pain management: pain level controlled Vital Signs Assessment: post-procedure vital signs reviewed and stable Respiratory status: spontaneous breathing, nonlabored ventilation, respiratory function stable and patient connected to nasal cannula oxygen Cardiovascular status: blood pressure returned to baseline and stable Postop Assessment: no apparent nausea or vomiting Anesthetic complications: no   No notable events documented.  Last Vitals:  Vitals:   06/22/23 1830 06/22/23 2114  BP: 113/70 110/67  Pulse: 60 65  Resp: 20 20  Temp: 36.4 C 37.2 C  SpO2: 100% 100%    Last Pain:  Vitals:   06/23/23 0834  PainSc: 3                  Shelton Silvas

## 2023-06-24 ENCOUNTER — Telehealth: Payer: 59 | Admitting: Student

## 2023-06-24 LAB — SURGICAL PATHOLOGY

## 2023-06-28 ENCOUNTER — Telehealth: Payer: Self-pay | Admitting: Student

## 2023-06-28 NOTE — Telephone Encounter (Signed)
Called the patient as she had concerns about some blisters developing.  She states that she noticed at the edges of the Mepilex border dressing, she was developing some blisters.  She states that there is a little bit of a burning sensation associated with the blisters.  She states she has been putting vitamin E oil on them.  She denies any fevers.  Denies any other issues.  Discussed with patient that she can remove the Mepilex border dressing.  She states that she did not feel comfortable doing this.  I did offer her an appointment today, but she states she cannot come in today.  She states she can come in tomorrow.  Discussed with her that it would probably be best to remove the Mepilex border dressings as these are most likely she is having some sort of reaction to the adhesive.  We will also plan to see the patient tomorrow to evaluate her.  I also instructed the patient to apply Vaseline over the blisters for now.  Patient expressed understanding.

## 2023-06-29 ENCOUNTER — Ambulatory Visit: Payer: 59 | Admitting: Student

## 2023-06-29 DIAGNOSIS — N62 Hypertrophy of breast: Secondary | ICD-10-CM

## 2023-06-29 NOTE — Progress Notes (Signed)
Patient is a 52 year old female who recently underwent bilateral breast reduction using the amputation technique with grafting of the nipple areola and the breasts bilaterally with Dr. Ulice Bold on 06/22/2023.  Intraoperatively, she had 1200 g removed from the right breast and 1400 g removed from the left breast.  JP drains were placed bilaterally.  Patient is 1 week postop.  She presents to the clinic today with concerns about welts near the Mepilex border dressing.  Today, patient reports she is doing okay.  She states that she is having some burning sensation to her breasts and has been having burning in her mouth as well.  She believes this was attributed to the doxycycline.  She states it has been improved though since stopping the doxycycline.  Patient also states that she has broken out with some blisters to her breast bilaterally.  She states that she has been applying vitamin E oil to these areas.  She states she feels this has been helping.  She reports she has been taking Tylenol for pain and then oxycodone at night.  Denies any fevers or chills.  Reports she is been putting out approximately 1 to 1-1/2 ounces from each of her drains bilaterally.  Chaperone present on exam.  On exam, patient is sitting upright in no acute distress.  Breasts her soft bilaterally and are symmetric.  Swelling noted bilaterally.  There is some bruising overlying each of the breast.  There is no overlying erythema.  No obvious fluid collections on exam.  Bolsters are in place over the NAC's bilaterally.  These were removed.  NAC's appear to be dark/bruised bilaterally.  There is a little bit of irritation around the NAC's bilaterally.  Mepilex border dressings were removed, Tegaderm around the drain sites were removed and several Steri-Strips were removed.  There is some blistering of the skin and irritation of the skin noted bilaterally.  No signs of infection.  Vertical limb incisions are intact with Steri-Strips.   Inframammary incisions appear to be intact and healing well.  JP drains are in place and functioning bilaterally with serosanguineous drainage in the bulbs.  Discussed with patient that she should apply Xeroform daily over her NAC's.  Discussed with her that if she runs out of Xeroform, she can use Vaseline and a nonstick gauze over the NAC's daily.  Discussed with patient that NAC's will most likely have some superficial sloughing over the next few weeks.  Patient expressed understanding.  Discussed with patient that most of her pain is mostly coming from where liposuction was done.  Discussed with her to continue to take Tylenol and ibuprofen, and only oxycodone when needed.  She states that she only has 2 pills of oxycodone left.  Discussed with her that I would like her to see how she does in the next few days, if she needs a few more pills of oxycodone I will send some in, but if she improves and does not need them that would be preferred.  Patient expressed understanding.  Discussed with patient to apply Vaseline daily to the areas of irritation and to her blisters.  Patient expressed understanding.  Recommended patient hold off from a shower for now.  Patient to continue to record her drain output.  I would like the patient to follow back up next week for reevaluation.  I instructed her to call in the meantime she has any questions or concerns about anything.

## 2023-07-01 ENCOUNTER — Telehealth: Payer: Self-pay | Admitting: Student

## 2023-07-01 ENCOUNTER — Encounter: Payer: 59 | Admitting: Student

## 2023-07-01 MED ORDER — OXYCODONE HCL 5 MG PO TABS
5.0000 mg | ORAL_TABLET | Freq: Four times a day (QID) | ORAL | 0 refills | Status: DC | PRN
Start: 2023-07-01 — End: 2023-11-17

## 2023-07-01 NOTE — Telephone Encounter (Signed)
Patient called the clinic yesterday stating that she was still having pain and was requesting more medications.  We did discuss the possibility of more oxycodone at her most recent visit.  I did want to see how she would do for a few days, but she called back stating that she would like a few more pills given she was still having pain.  I called the patient this morning and she states that she is still having some pain where she had a reaction to some of the tape as well as her drain sites.  She states she has been applying Vaseline to her drain sites which has been helping.  She reports she has been utilizing Tylenol and ibuprofen.  She states that she has been up and moving more.  Discussed with patient that I will send her a few more pills of oxycodone, but after I send her this in today, I will not send her any more after this.  Patient expressed understanding.  I discussed with the patient that if she has any worsening symptoms, fevers, chills she needs to call us.  Otherwise patient will follow-up on Tuesday.

## 2023-07-05 ENCOUNTER — Ambulatory Visit (INDEPENDENT_AMBULATORY_CARE_PROVIDER_SITE_OTHER): Payer: 59 | Admitting: Student

## 2023-07-05 ENCOUNTER — Encounter: Payer: Self-pay | Admitting: Student

## 2023-07-05 VITALS — BP 129/81 | HR 86

## 2023-07-05 DIAGNOSIS — N62 Hypertrophy of breast: Secondary | ICD-10-CM

## 2023-07-05 NOTE — Progress Notes (Signed)
Patient is a 52 year old female who recently underwent bilateral breast reduction using the amputation technique with grafting of the nipple areola to the breasts bilaterally with Dr. Ulice Bold on 06/22/2023.  Patient is almost 2 weeks postop.  She presents to the clinic today for further follow-up.  Patient was last seen in the clinic on 06/29/2023.  At this visit, patient was reporting she is doing okay.  She stated that she was having some pain.  She also reported she was having some blistering which she thought was due from some of the adhesives.  On exam, her breasts were soft and symmetric.  There was some swelling noted bilaterally.  There was some also bruising noted over each of the breasts.  No fluid collections on exam.  NAC's appear to be dark and bruised bilaterally.  There was some blistering of the skin and irritation noted bilaterally.  Vertical limb incisions were intact with Steri-Strips.  Inframammary incisions appear to be intact and healing well.  Plan is for patient to apply Xeroform to her NAC's daily.  Patient was also sent a few more pain pills given her continued pain.  Today, patient reports she is doing much better in terms of pain.  She states that she has some discomfort at the drain sites, but her pain is much improved.  Patient reports her drains have put out approximately 50 cc in the past 24 hours.  She denies any other issues or concerns.  Denies any fevers or chills.  Chaperone present on exam.  On exam, patient is sitting upright in no acute distress.  Breasts are soft and symmetric.  There is no overlying erythema.  No fluid collections on exam.  There is some skin abrasions, probably from the blisters the patient experienced at her previous visit to the skin of the breast bilaterally.  There is no drainage or signs of infection.  NAC's bilaterally have some superficial epidermolysis noted.  Incisions overall appear to be intact bilaterally.  There does appear to be a  little bit of irritation at the skin to the inferior T-zone of the left breast.  JP drains are intact and functioning bilaterally.  There is serosanguineous drainage in each of the bulbs.  Recommended the patient continue with Xeroform dressing changes to the NAC's daily.  Recommended she apply Vaseline to her incisions and to the skin abrasions daily.  Patient expressed understanding.  Recommended she continue Vaseline around the drain sites daily.  Patient expressed understanding.  Patient to continue compression at all times.  Patient to follow back up next week.  Instructed her to call in the meantime if she has any questions or concerns about anything.

## 2023-07-12 ENCOUNTER — Encounter: Payer: Self-pay | Admitting: Plastic Surgery

## 2023-07-12 ENCOUNTER — Ambulatory Visit (INDEPENDENT_AMBULATORY_CARE_PROVIDER_SITE_OTHER): Payer: 59 | Admitting: Plastic Surgery

## 2023-07-12 DIAGNOSIS — N62 Hypertrophy of breast: Secondary | ICD-10-CM

## 2023-07-12 NOTE — Progress Notes (Signed)
   Subjective:    Patient ID: Sheryl [name], female    DOB: [birth year], 52 y.o.   MRN: [id number]  The patient is a 52 year old female here for follow-up after undergoing bilateral breast reductions on October 16.  The patient had had over 1200 g removed from both breasts with nipple grafting.  The bolsters were removed at her previous visit.  Her drain output has been minimal.  She is got a little bit of scabbing on the nipple areolar complex but it all looks as it should at this point in time.  No sign of a hematoma or seroma.  She is a lot irritation around the drain sites.      Review of Systems  Constitutional:  Positive for activity change.  Eyes: Negative.   Respiratory: Negative.    Cardiovascular: Negative.   Gastrointestinal: Negative.        Objective:   Physical Exam Cardiovascular:     Rate and Rhythm: Normal rate.     Pulses: Normal pulses.  Neurological:     Mental Status: She is oriented to person, place, and time.  Psychiatric:        Mood and Affect: Mood normal.        Behavior: Behavior normal.        Thought Content: Thought content normal.        Judgment: Judgment normal.        Assessment & Plan:     ICD-10-CM   1. Symptomatic mammary hypertrophy  N62       Remove both drains.  I would like her to use some Vashe to clean the drain sites.  Then she can use some Vaseline and gauze.  Continue with the Xeroform to the nipple areolar complex and we will see her back in 1 to 2 weeks.

## 2023-07-18 ENCOUNTER — Other Ambulatory Visit: Payer: Self-pay | Admitting: Thoracic Surgery (Cardiothoracic Vascular Surgery)

## 2023-07-18 DIAGNOSIS — I712 Thoracic aortic aneurysm, without rupture, unspecified: Secondary | ICD-10-CM

## 2023-07-20 ENCOUNTER — Telehealth: Payer: Self-pay | Admitting: *Deleted

## 2023-07-20 ENCOUNTER — Ambulatory Visit: Payer: 59 | Admitting: Student

## 2023-07-20 ENCOUNTER — Encounter: Payer: Self-pay | Admitting: Student

## 2023-07-20 VITALS — BP 118/75 | HR 77 | Ht 67.5 in | Wt 286.0 lb

## 2023-07-20 DIAGNOSIS — Z9889 Other specified postprocedural states: Secondary | ICD-10-CM

## 2023-07-20 NOTE — Progress Notes (Signed)
Patient is a 52 year old female who recently underwent bilateral breast reduction using the amputation technique with grafting of the nipple areola to the breasts bilaterally with Dr. Ulice Bold on 06/22/2023.  Patient is 4 weeks postop.  She presents to the clinic today for further follow-up.     Patient was last seen in the clinic on 07/12/2023.  At this visit, patient was doing well.  She had a little bit of scabbing on the nipple areolar complex, but it looked as it should at that point in time.  There were no signs of hematoma or seroma.  There was a lot of irritation around the drain sites.  Drains were removed.  Plan was for her to clean the drain sites with Vashe and then apply Vaseline and gauze.  Plan was for her to continue Xeroform to her nipple areolar complex and return in 1 to 2 weeks.  Today, patient reports she is doing well.  She states that her pain has improved since removal of her drains.  She reports she takes Tylenol from time to time.  She does state that she has been applying Vashe to her drain sites.  Denies any other issues or concerns.  Denies any fevers or chills.  Has been applying Xeroform to her NAC's bilaterally.  Chaperone present on exam.  On exam, patient is sitting upright in no acute distress.  Breasts are soft and symmetric.  There is no obvious fluid collection.  No overlying erythema or ecchymosis.  NAC grafts appear to be well adhered.  There is a little bit of eschar noted centrally to the nipple bilaterally and some exudate noted to the superior aspect of the left NAC.  There is no surrounding erythema or drainage.  Incisions bilaterally appear to be intact and healing well.  There were several suture knots that were cut and removed.  Patient tolerated well.  Drain sites do appear to be healing, left is a little bit more irritated than the right.  There are no signs of infection on exam.  Discussed with patient to continue Vaseline and Xeroform to her NAC's daily.   Discussed with her that she can apply Vashe and then Vaseline to her drain sites daily.  Patient expressed understanding.  Instructed her to wear compression at all times.  Patient to follow back up in 2 weeks.  Instructed her to call in the meantime she has any questions or concerns.

## 2023-07-20 NOTE — Telephone Encounter (Signed)
Faxed order,demographic,insurance information,and recent office notes to [facility] [place] for bras supplies for the patient.  Confirmation received and copy scanned into the chart.//AB/CMA

## 2023-07-25 ENCOUNTER — Encounter: Payer: Self-pay | Admitting: Thoracic Surgery (Cardiothoracic Vascular Surgery)

## 2023-07-25 ENCOUNTER — Encounter: Payer: 59 | Admitting: Student

## 2023-07-25 ENCOUNTER — Telehealth: Payer: Self-pay | Admitting: *Deleted

## 2023-07-25 NOTE — Telephone Encounter (Signed)
Patient called to say she had an Breast Reduction 4 weeks ago (06/26/23).  She stated that she has been having brain fog,unable to focus,concentrate,and forgetfulness since the surgery.  She wanted to know if that is normal.  Asked the patient about her appointment that was canceled today.  She stated that provider canceled the appointment, but she was rescheduled for the 27th.    Informed the patient that I spoke with Garrett,PA-C, and he stated that it could be coming from the anesthesia.  For her to follow-up as needed.  Patient verbalized understanding and agreed.//AB/CMA

## 2023-08-03 ENCOUNTER — Ambulatory Visit: Payer: 59 | Admitting: Student

## 2023-08-03 ENCOUNTER — Encounter: Payer: 59 | Admitting: Student

## 2023-08-03 ENCOUNTER — Encounter: Payer: Self-pay | Admitting: Student

## 2023-08-03 VITALS — BP 122/80 | HR 80 | Ht 67.5 in | Wt 285.0 lb

## 2023-08-03 DIAGNOSIS — Z9889 Other specified postprocedural states: Secondary | ICD-10-CM

## 2023-08-03 NOTE — Progress Notes (Signed)
Patient is a 52 year old female who recently underwent bilateral breast reduction using the amputation technique with grafting of the nipple areola to the breasts bilaterally with Dr. Ulice Bold on 06/22/2023.  She is about a month and a half postop.  She presents to the clinic today for further postoperative follow-up.  Patient was last seen in the clinic on 07/20/2023.  At this visit, patient was doing well.  On exam, breasts are soft and symmetric.  NAC grafts appeared well adhered.  There is a little bit of eschar noted centrally to the nipple bilaterally and some exudate noted to the superior aspect of the left NAC.  Plan is for patient to continue with Vaseline and Xeroform daily to her NAC's.  Today, patient reports she is doing well.  She states that she has a little bit of brain fog that she has been experiencing since surgery.  She feels that it is a little bit improved this week, but states that it is still present.  She reports that her previous drain site wound is still present.  Otherwise denies any other complaints or concerns.  On exam, patient is sitting upright in no acute distress.  Breasts are soft and symmetric.  There is no overlying erythema.  No fluid collections palpated on exam.  NAC's appear to be well adhered and healing well.  There is still some residual suture noted around the NAC's, this was cut and removed.  Patient tolerated well.  Incisions appear to be well-healed.  Wound over drain site to the left is still present.  It appears to be very superficial.  No signs of infection on exam.  Instructed patient to continue Vaseline daily to her NAC's, to the incisions and to her drain wound.  Patient expressed understanding.  Discussed with patient that she may start gradually increasing her activities.  Discussed with her she does not have to wear compression anymore.  Encouraged her to drink plenty of water and make sure that she is eating well.  Discussed with her that  hopefully increasing her activities and getting back to more normal daily activities may help with her brain fog.  Patient expressed understanding.  Patient to follow back up in 3 to 4 weeks.  Instructed her to call in the meantime she has any questions about anything.  Pictures were obtained of the patient and placed in the chart with the patient's or guardian's permission.

## 2023-08-16 ENCOUNTER — Other Ambulatory Visit: Payer: Self-pay

## 2023-08-16 MED ORDER — ZOLMITRIPTAN 5 MG PO TABS: ORAL_TABLET | ORAL | 11 refills | Status: DC

## 2023-08-16 NOTE — Progress Notes (Unsigned)
Virtual Visit via Video Note  I connected with Sheryl [name] on 08/16/23 at  2:15 PM EST by a video enabled telemedicine application and verified that I am speaking with the correct person using two identifiers.  Location: Patient: at her home Provider: in the office    I discussed the limitations of evaluation and management by telemedicine and the availability of in person appointments. The patient expressed understanding and agreed to proceed.  PATIENT: Sheryl [name] DOB: [birth year]  REASON FOR VISIT: follow up for migraines  HISTORY FROM: patient Primary Neurologist: Dr. Terrace Arabia   ASSESSMENT AND PLAN 52 y.o. year old female  1.  Chronic migraine headache -Migraines remain under very good control -Try to reduce atenolol 25 mg daily, cut back to 1/2 tablet daily for 1 month, if no increase in headache can stop it, monitor BP -Continue Zomig as needed for acute headache, may combine with Zofran, Aleve for acute migraine  -Follow-up in 1 year for virtual visit  Meds ordered this encounter  Medications   atenolol (TENORMIN) 25 MG tablet    Sig: Take 1 tablet (25 mg total) by mouth daily.    Dispense:  90 tablet    Refill:  0    This is the most up to date   HISTORY Sheryl [name] is a 52 years old female, seen in refer by  her primary care doctor [name], for evaluation of frequent migraine headache initial evaluation was June 15 2017.   I have reviewed the record, she has a long history of migraine headaches since teenager, gradually getting worse, began to see [name] in 2008, at that time she reported almost daily headaches,   She was treated with preventive medication atenolol 50 mg daily, which is helpful, previously also tried amitriptyline, nortriptyline, verapamil, which all helped some, but gradually loses benefit,   She was able to identify triggers such as cheese, onion, stress, caffeine, chocolate, heat,   Her headache overall has much improved, she is  now taking Relpax as needed, that is the only triptan she has tried, at its best, it would take away her headache within one hour, but sometimes her headache would last for half days, occasionally she received Dilaudid, Phenergan and Demerol as abortive treatment   Her typical migraine are right lateralized severe pounding headache with associated light noise sensitivity, nauseous, vomiting, sometimes involving left side,   She was admitted to the hospital in September 2011 for 1 prolonged episode of headache with associated right side weakness and blurry vision, she had extensive evaluations, MRI of the brain showed no acute abnormality. Echocardiogram showed ejection fraction 65%, transcranial Doppler study was normal,   UPDATE 11/25/2017:YY Her father passed away on 08/04/2017, she complains of increased headaches, 8-10 migraines each month,Maxalt does help her migraine, she felt wiped out afterwards     UPDATE Oct 31 2018: She is doing very well with atenolol 25 mg every day as headache prevention, on he has 1-2 headaches each month, responding well to Relpax,   Update November 12, 2019 SS: Via virtual visit, reports headaches had been doing really well, for the last 30 days, has had 6 migraines.  Usually, she only has 2 a month.  Attributed to increased stress, her son is having a baby unexpectedly.  Currently, Zomig works well for her, but she only gets 3 tablets a month per insurance.  She remains on atenolol.   Update February 14, 2020 SS:  After last visit, atenolol was increased to 50 mg for migraine prevention due to worsening headaches associated with increased stress.  She only took the higher dose for 1 month, went back down to 25 mg daily.  She is doing well.  On average 1-2 headaches a month. She has Zomig, Zanaflex, Zofran for acute headache-these work well for her. She only gets 3 Zomig tablets a month.  She is now exercising, trying to stay out of the heat which can be a trigger for  migraines.  Presents today for evaluation unaccompanied. She is expecting her first grandchild.  Update February 19, 2021 SS: On average 1-2 migraines a month, April had none. This month has had 2, heat related. Insurance won't pay for Relpax this works best. For rescue now: Zomig, Zofran, tizanidine. Has slight headache today, came home from beach after 1 week. Didn't feel higher dose of atenolol was helpful.   Update August 27, 2021 SS: Lately only 2 migraines days a month, this is good for her. 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 At 1 time 7-10 migraines a month. Remains on atenolol 25 mg daily for migraine prevention. Taking Zomig usually works great, recently had to take it twice due to lack of sleep. Insurance won't cover Relpax. Keep Zofran, tizanidine if needed for bad migraine. A lot less stress right now with family.   Update August 11, 2022 SS: Doing well, got a promotion, having a new grandbaby. Migraines on average 1-2 a month. Remains on atenolol 25 mg daily, tried to stop but had more headaches. Zomig works well, works usually within 30 minutes, can be 2 hours. Will take Zofran for nausea if needed. Sometimes neck tension can trigger migraine, uses tizanidine, but finds that Flexeril works better. Needs knee replacement but needs to lose weight first   Update August 17, 2023 SS: Via VV, having about 1-3 migraines a month, increased a little, due to recent illness, had breast reduction in October, on pain medication caused rebound headache. Remains on atenolol. Uses Zomig, add with zofran, hasn't had any flexeril, doesn't think she needs. Has been sore healing from breast reduction.   REVIEW OF SYSTEMS: Out of a complete 14 system review of symptoms, the patient complains only of the following symptoms, and all other reviewed systems are negative.  See HPI  ALLERGIES: Allergies  Allergen Reactions   Aspirin Anaphylaxis and Hives   Salicylates Anaphylaxis and Hives     Unknown reaction Sulfur meds   Codeine Other (See Comments)    Migraines, anxiety    Fentanyl Nausea And Vomiting    Migraine    HOME MEDICATIONS: Outpatient Medications Prior to Visit  Medication Sig Dispense Refill   acetaminophen (TYLENOL) 500 MG tablet Take 500-1,000 mg by mouth every 6 (six) hours as needed (for pain/headaches.).     albuterol (PROVENTIL) (2.5 MG/3ML) 0.083% nebulizer solution Take 2.5 mg by nebulization every 6 (six) hours as needed for shortness of breath.     albuterol (VENTOLIN HFA) 108 (90 Base) MCG/ACT inhaler Inhale 2 puffs into the lungs every 6 (six) hours as needed for wheezing or shortness of breath.     ascorbic acid (VITAMIN C) 500 MG tablet Take 500 mg by mouth daily.     atenolol (TENORMIN) 25 MG tablet Take 1 tablet (25 mg total) by mouth daily. 90 tablet 4   cholecalciferol (VITAMIN D3) 25 MCG (1000 UNIT) tablet Take 1,000 Units by mouth daily.     cyanocobalamin (  VITAMIN B12) 1000 MCG tablet Take 1,000 mcg by mouth daily.     diphenhydrAMINE-zinc acetate (BENADRYL) cream Apply 1 Application topically 3 (three) times daily as needed for itching.     fexofenadine-pseudoephedrine (ALLEGRA-D 24) 180-240 MG 24 hr tablet Take 1 tablet by mouth daily.     fluticasone (FLONASE) 50 MCG/ACT nasal spray Place 2 sprays into both nostrils daily.     ipratropium-albuterol (DUONEB) 0.5-2.5 (3) MG/3ML SOLN Take 3 mLs by nebulization every 6 (six) hours as needed (severe shortness of breath).     Melatonin 10 MG TABS Take 10 mg by mouth at bedtime.     ondansetron (ZOFRAN) 4 MG tablet Take 1 tablet (4 mg total) by mouth every 8 (eight) hours as needed for up to 20 doses for nausea or vomiting. 20 tablet 0   oxyCODONE (ROXICODONE) 5 MG immediate release tablet Take 1 tablet (5 mg total) by mouth every 6 (six) hours as needed for up to 4 doses for severe pain (pain score 7-10). 4 tablet 0   zolmitriptan (ZOMIG) 5 MG tablet TAKE 1 TABLET BY MOUTH AT ONSET OF MIGRAINE.  MAY REPEAT IN 2 HOURS IF NEEDED. MAX 2 IN 24 HOURS 12 tablet 11   No facility-administered medications prior to visit.    PAST MEDICAL HISTORY: Past Medical History:  Diagnosis Date   Allergic rhinitis    Anemia    Arthritis    bilateral knees   Asthma    Atypical chest pain 01/07/2012   Ct chest 10/2011:  No acute process, no PE to midsized pulmonary vessels.     Chronic migraine    Migraines    Miscarriage 08/1996, 12/1996   x2   Normal labor 03/19/2014   Pneumonia    PONV (postoperative nausea and vomiting)    Postoperative state 03/20/2014   SVD (spontaneous vaginal delivery)    x 2   Vitamin D deficiency     PAST SURGICAL HISTORY: Past Surgical History:  Procedure Laterality Date   ABDOMINAL HYSTERECTOMY     BREAST REDUCTION SURGERY Bilateral 06/22/2023   Procedure: BILATERAL BREAST REDUCTION WITH LIPOSUCTION;  Surgeon: Peggye Form, DO;  Location: [facility];  Service: Plastics;  Laterality: Bilateral;   BREAST SURGERY     cyst removed from left breast; benign   CESAREAN SECTION N/A 03/20/2014   Procedure: CESAREAN SECTION;  Surgeon: Loney Laurence, MD;  Location: [facility];  Service: Obstetrics;  Laterality: N/A;   cyst removed from L breast  10/2006   benign   CYSTOSCOPY N/A 06/01/2017   Procedure: CYSTOSCOPY;  Surgeon: Carrington Clamp, MD;  Location: [facility];  Service: Gynecology;  Laterality: N/A;   DILATION AND CURETTAGE OF UTERUS  08/1996, 12/1996   x 2 MAB   DILATION AND CURETTAGE OF UTERUS     KNEE ARTHROSCOPY Right 01/2016   KNEE ARTHROSCOPY Right 08/2016   LEFT HEART CATH AND CORONARY ANGIOGRAPHY N/A 04/14/2018   Procedure: LEFT HEART CATH AND CORONARY ANGIOGRAPHY;  Surgeon: Yvonne Kendall, MD;  Location: [facility];  Service: Cardiovascular;  Laterality: N/A;   live births  10/07/97, 02/08/2000   x2   ROBOTIC ASSISTED TOTAL HYSTERECTOMY WITH SALPINGECTOMY Bilateral 06/01/2017   Procedure: ROBOTIC ASSISTED TOTAL HYSTERECTOMY WITH  SALPINGECTOMY WITH PARTIAL REMOVAL OF HERNIA SAC;  Surgeon: Carrington Clamp, MD;  Location: [facility];  Service: Gynecology;  Laterality: Bilateral;   TUBAL LIGATION Bilateral 03/20/2014   Procedure: BILATERAL TUBAL LIGATION;  Surgeon: Loney Laurence, MD;  Location:  [facility];  Service: Obstetrics;  Laterality: Bilateral;   UMBILICAL HERNIA REPAIR     UNILATERAL SALPINGECTOMY Right 03/20/2014   Procedure: UNILATERAL SALPINGECTOMY;  Surgeon: Loney Laurence, MD;  Location: [facility];  Service: Obstetrics;  Laterality: Right;    FAMILY HISTORY: Family History  Problem Relation Age of Onset   Heart attack Father    Hypertension Father    Hyperlipidemia Father    Asthma Son    Heart disease Maternal Grandfather    Cancer Paternal Grandmother        unsure what kind    SOCIAL HISTORY: Social History   Socioeconomic History   Marital status: Married    Spouse name: Not on file   Number of children: 3   Years of education: 2.5 yrs college   Highest education level: Not on file  Occupational History   Occupation: Production designer, theatre/television/film. Rep    Employer: [employer]  Tobacco Use   Smoking status: Never   Smokeless tobacco: Never  Vaping Use   Vaping status: Never Used  Substance and Sexual Activity   Alcohol use: No   Drug use: No   Sexual activity: Yes    Birth control/protection: Surgical  Other Topics Concern   Not on file  Social History Narrative   Lives at home with husband and three children.   Left-handed.   No caffeine use.       Social Determinants of Health   Financial Resource Strain: Not on file  Food Insecurity: Not on file  Transportation Needs: Not on file  Physical Activity: Not on file  Stress: Not on file  Social Connections: Unknown (01/18/2022)   Received from [facility]   Social Network    Social Network: Not on file  Intimate Partner Violence: Unknown (12/10/2021)   Received from [facility]    HITS    Physically Hurt: Not on file    Insult or Talk Down To: Not on file    Threaten Physical Harm: Not on file    Scream or Curse: Not on file   PHYSICAL EXAM  There were no vitals filed for this visit.  Via video visit, is alert and oriented, facial symmetry noted, speech is clear and concise, moves about freely  DIAGNOSTIC DATA (LABS, IMAGING, TESTING) - I reviewed patient records, labs, notes, testing and imaging myself where available.  Lab Results  Component Value Date   WBC 9.4 06/16/2023   HGB 14.4 06/16/2023   HCT 43.2 06/16/2023   MCV 93.3 06/16/2023   PLT 359 06/16/2023      Component Value Date/Time   NA 141 06/16/2023 1508   NA 138 04/26/2018 1204   K 4.2 06/16/2023 1508   CL 102 06/16/2023 1508   CO2 28 06/16/2023 1508   GLUCOSE 103 (H) 06/16/2023 1508   BUN 10 06/16/2023 1508   BUN 10 04/26/2018 1204   CREATININE 0.90 06/16/2023 1508   CALCIUM 9.5 06/16/2023 1508   PROT 6.3 (L) 08/13/2021 1750   ALBUMIN 3.7 08/13/2021 1750   AST 43 (H) 08/13/2021 1750   ALT 27 08/13/2021 1750   ALKPHOS 55 08/13/2021 1750   BILITOT 1.0 08/13/2021 1750   GFRNONAA >60 06/16/2023 1508   GFRAA 116 04/26/2018 1204   Lab Results  Component Value Date   CHOL 168 04/26/2018   HDL 40 04/26/2018   LDLCALC 87 04/26/2018   TRIG 205 (H) 04/26/2018   CHOLHDL 4.2 04/26/2018   Lab Results  Component Value Date   HGBA1C  06/03/2010    5.5 (NOTE)                                                                       According to the ADA Clinical Practice Recommendations for 2011, when HbA1c is used as a screening test:   >=6.5%   Diagnostic of Diabetes Mellitus           (if abnormal result  is confirmed)  5.7-6.4%   Increased risk of developing Diabetes Mellitus  References:Diagnosis and Classification of Diabetes Mellitus,Diabetes Care,2011,34(Suppl 1):S62-S69 and Standards of Medical Care in         Diabetes - 2011,Diabetes Care,2011,34  (Suppl 1):S11-S61.   No results  found for: "VITAMINB12" No results found for: "TSH"  Margie Ege, AGNP-C, DNP 08/16/2023, 4:19 PM [facility] [address], [address] [place], [place] [address] [phone]

## 2023-08-17 ENCOUNTER — Telehealth: Payer: 59 | Admitting: Neurology

## 2023-08-17 DIAGNOSIS — G43709 Chronic migraine without aura, not intractable, without status migrainosus: Secondary | ICD-10-CM | POA: Diagnosis not present

## 2023-08-17 MED ORDER — ATENOLOL 25 MG PO TABS: 25.0000 mg | ORAL_TABLET | Freq: Every day | ORAL | 0 refills | Status: DC

## 2023-08-17 NOTE — Patient Instructions (Signed)
Try to reduce atenolol to 1/2 tablet daily for 1 month, if no increase in headache stop it.  Monitor your blood pressure.  Continue Zomig as needed.  Keep me posted.  See back in 1 year.  Thanks!!

## 2023-09-05 ENCOUNTER — Encounter: Payer: 59 | Admitting: Student

## 2023-09-05 ENCOUNTER — Ambulatory Visit
Admission: RE | Admit: 2023-09-05 | Discharge: 2023-09-05 | Disposition: A | Discharge status: Home / Self Care | Payer: 59 | Source: Ambulatory Visit | Attending: Thoracic Surgery (Cardiothoracic Vascular Surgery) | Admitting: Thoracic Surgery (Cardiothoracic Vascular Surgery)

## 2023-09-05 DIAGNOSIS — I712 Thoracic aortic aneurysm, without rupture, unspecified: Secondary | ICD-10-CM

## 2023-09-05 MED ORDER — IOPAMIDOL (ISOVUE-370) INJECTION 76%
200.0000 mL | Freq: Once | INTRAVENOUS | Status: AC | PRN
  Administered 2023-09-05: 80 mL via INTRAVENOUS

## 2023-09-13 ENCOUNTER — Encounter: Payer: Self-pay | Admitting: Thoracic Surgery (Cardiothoracic Vascular Surgery)

## 2023-09-13 ENCOUNTER — Ambulatory Visit (INDEPENDENT_AMBULATORY_CARE_PROVIDER_SITE_OTHER): Payer: 59 | Admitting: Thoracic Surgery (Cardiothoracic Vascular Surgery)

## 2023-09-13 ENCOUNTER — Encounter: Payer: Self-pay | Admitting: Student

## 2023-09-13 ENCOUNTER — Ambulatory Visit: Payer: 59 | Admitting: Student

## 2023-09-13 VITALS — BP 127/88 | HR 85 | Resp 20 | Ht 67.5 in | Wt 285.0 lb

## 2023-09-13 VITALS — BP 128/86 | HR 80 | Ht 67.5 in | Wt 284.0 lb

## 2023-09-13 DIAGNOSIS — I712 Thoracic aortic aneurysm, without rupture, unspecified: Secondary | ICD-10-CM

## 2023-09-13 DIAGNOSIS — Z9889 Other specified postprocedural states: Secondary | ICD-10-CM

## 2023-09-13 NOTE — Progress Notes (Signed)
 Patient is a 53 year old female who underwent bilateral breast reduction using the amputation technique with grafting of the nipple areola to the breast bilaterally Dr. Lowery on 06/22/2023.  She is almost 3 months postop.  She presents to the clinic today for postoperative follow-up.  Patient was last seen in the clinic on 08/03/2023.  At this visit, patient was doing well.  On exam, NAC's appeared to be well adhered and healing well.  Incision appeared to be well-healed.  There was still a wound present over the drain site.  Today, patient reports she is doing really well.  She states that she did have a blister over her right NAC recently, but it has since resolved.  She also does report some discomfort at the left lateral incision.  She denies any drainage.  She denies any fevers or chills.  She does reports she feels some sutures still on the right lateral aspect of her incision.  She denies any other issues or concerns.  Chaperone present on exam.  On exam, patient is sitting upright in no acute distress.  Breasts are soft and symmetric.  There is no overlying erythema.  No obvious fluid collections on exam.  NAC's appear to be well adhered.  There does appear to be a little bit of irritation to the right NAC where a blister may have been, but does appear to be predominantly healed at this time.  No signs of infection on exam.  Incisions overall appear to be well-healed.  There were 2 sutures noted to be protruding from the skin to the right lateral incision.  These were removed without any difficulty.  Patient tolerated well.  There is a small bump to the left lateral incision that is consistent with a suture underneath the skin.  There is no drainage.  No fluctuance on exam.  Recommended that patient apply Vaseline to her right NAC daily.  Also recommended that she massage left lateral incision to help break up sutures that may be under the skin.  Patient expressed understanding.  Discussed  with her that if pain to the left lateral incision persists or if blisters to right NAC return, to let us  know.  Otherwise patient may follow-up as needed.  Instructed her to call us  if she has any questions or concerns about anything.  Pictures were obtained of the patient and placed in the chart with the patient's or guardian's permission.

## 2023-09-13 NOTE — Progress Notes (Signed)
 [address]       Sheryl [name] [address]             [phone]      HPI: Sheryl [name] returns for follow-up of her ascending aortic ectasia  Sheryl [name] is a 53 year old woman with a history of obesity, atypical chest pain, migraines, and ascending aortic ectasia.  First noted to have a large aorta and 2022.  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  Reviewing the images it appeared to be about 3.8 cm.  I saw her back in the office in December 2023.  Aorta was stable at 3.8 cm.  The interim since her last visit she underwent bilateral breast reduction.  She is lost about 15 pounds intentionally.  She had gotten up to almost 300 pounds.     Past Medical History:  Diagnosis Date   Allergic rhinitis    Anemia    Arthritis    bilateral knees   Asthma    Atypical chest pain 01/07/2012   Ct chest 10/2011:  No acute process, no PE to midsized pulmonary vessels.     Chronic migraine    Migraines    Miscarriage 08/1996, 12/1996   x2   Normal labor 03/19/2014   Pneumonia    PONV (postoperative nausea and vomiting)    Postoperative state 03/20/2014   SVD (spontaneous vaginal delivery)    x 2   Vitamin D deficiency      Current Outpatient Medications  Medication Sig Dispense Refill   acetaminophen  (TYLENOL ) 500 MG tablet Take 500-1,000 mg by mouth every 6 (six) hours as needed (for pain/headaches.).     albuterol (PROVENTIL) (2.5 MG/3ML) 0.083% nebulizer solution Take 2.5 mg by nebulization every 6 (six) hours as needed for shortness of breath.     albuterol (VENTOLIN HFA) 108 (90 Base) MCG/ACT inhaler Inhale 2 puffs into the lungs every 6 (six) hours as needed for wheezing or shortness of breath.     ascorbic acid (VITAMIN C) 500 MG tablet Take 500 mg by mouth daily.     atenolol  (TENORMIN ) 25 MG tablet Take 1 tablet (25 mg total) by mouth daily. 90 tablet 0   cholecalciferol (VITAMIN D3) 25 MCG (1000 UNIT) tablet Take 1,000 Units by mouth daily.      cyanocobalamin (VITAMIN B12) 1000 MCG tablet Take 1,000 mcg by mouth daily.     diphenhydrAMINE -zinc acetate (BENADRYL ) cream Apply 1 Application topically 3 (three) times daily as needed for itching.     fexofenadine-pseudoephedrine  (ALLEGRA-D 24) 180-240 MG 24 hr tablet Take 1 tablet by mouth daily.     fluticasone (FLONASE) 50 MCG/ACT nasal spray Place 2 sprays into both nostrils daily.     ipratropium-albuterol (DUONEB) 0.5-2.5 (3) MG/3ML SOLN Take 3 mLs by nebulization every 6 (six) hours as needed (severe shortness of breath).     Melatonin 10 MG TABS Take 10 mg by mouth at bedtime.     ondansetron  (ZOFRAN ) 4 MG tablet Take 1 tablet (4 mg total) by mouth every 8 (eight) hours as needed for up to 20 doses for nausea or vomiting. 20 tablet 0   zolmitriptan  (ZOMIG ) 5 MG tablet TAKE 1 TABLET BY MOUTH AT ONSET OF MIGRAINE. MAY REPEAT IN 2 HOURS IF NEEDED. MAX 2 IN 24 HOURS 12 tablet 11   oxyCODONE  (ROXICODONE ) 5 MG immediate release tablet Take 1 tablet (5 mg total) by mouth every 6 (six) hours as needed for  up to 4 doses for severe pain (pain score 7-10). (Patient not taking: Reported on 09/13/2023) 4 tablet 0   No current facility-administered medications for this visit.    Physical Exam BP 127/88   Pulse 85   Resp 20   Ht 5' 7.5 (1.715 m)   Wt 285 lb (129.3 kg)   LMP 05/26/2017 (Exact Date)   SpO2 96% Comment: RA  BMI 43.98 kg/m  Obese 53 year old woman in no acute distress Alert and oriented x 3 with no focal deficits Lungs clear bilaterally Cardiac regular rate and rhythm, no murmur  Diagnostic Tests: CT ANGIOGRAPHY CHEST WITH CONTRAST   TECHNIQUE: Multidetector CT imaging of the chest was performed using the standard protocol during bolus administration of intravenous contrast. Multiplanar CT image reconstructions and MIPs were obtained to evaluate the vascular anatomy.   RADIATION DOSE REDUCTION: This exam was performed according to the departmental dose-optimization  program which includes automated exposure control, adjustment of the mA and/or kV according to patient size and/or use of iterative reconstruction technique.   CONTRAST:  80mL ISOVUE -370 IOPAMIDOL  (ISOVUE -370) INJECTION 76%   COMPARISON:  08/20/2022.  CTA   FINDINGS: Cardiovascular: Heart is nonenlarged. No pericardial effusion. Diameter of the aorta is similar to previous. At the level of the right pulmonary artery the ascending thoracic aorta has a diameter of 3.8 cm. Previously 3.8 cm. Descending thoracic aorta same level measures 2.1 cm in diameter. Proximal ascending aorta just above the valves measures 3.4 cm. Distal aortic arch has a diameter approaching 2.6 cm. No dissection or other signs of aneurysm formation. The origin of the great vessels is preserved.   Mediastinum/Nodes: No specific abnormal lymph node enlargement identified in the axillary region, hilum or mediastinum. Preserved thyroid gland. Normal caliber thoracic esophagus.   Lungs/Pleura: No consolidation, pneumothorax or effusion.   Upper Abdomen: Grossly preserved. The adrenal glands are not completely included in the imaging field.   Musculoskeletal: Mild degenerative changes along the spine.   Review of the MIP images confirms the above findings.   IMPRESSION: Stable slight ectasia of the ascending aorta of 3.8 cm.     Electronically Signed   By: Ranell Bring M.D.   On: 09/05/2023 10:59   I personally reviewed the CT images.  There is ascending aortic ectasia with a maximum diameter of 3.8 cm.  Impression: Sheryl [name] is a 53 year old woman with a history of obesity, atypical chest pain, migraines, and ascending aortic ectasia.    Ascending aortic ectasia-stable at 3.8 cm.  Not an aneurysm but is large enough we should probably keep an eye on it.  I do not think she needs annual scans.  Will plan to scan her again in a couple of years.  Blood pressure is well-controlled.  Obesity -has lost about  15 pounds.  I congratulated her for that and emphasized the importance of continued weight loss.  Plan: Return in 2 years with CT chest.  Will do without contrast.  Will only need contrast if there is suggestion of a significant increase in size.  Elspeth JAYSON Millers, MD [facility] [phone]

## 2023-09-16 ENCOUNTER — Telehealth: Payer: Self-pay

## 2023-09-16 NOTE — Telephone Encounter (Signed)
 Faxed signed DME order to [facility] with confirmed receipt.

## 2023-10-28 ENCOUNTER — Emergency Department (HOSPITAL_COMMUNITY): Admission: EM | Admit: 2023-10-28 | Discharge: 2023-10-28 | Discharge status: Against Medical Advice | Payer: 59

## 2023-10-28 NOTE — ED Notes (Signed)
Pt called for triage with no answer 

## 2023-11-16 ENCOUNTER — Emergency Department (HOSPITAL_BASED_OUTPATIENT_CLINIC_OR_DEPARTMENT_OTHER): Admitting: Anesthesiology

## 2023-11-16 ENCOUNTER — Other Ambulatory Visit: Payer: Self-pay

## 2023-11-16 ENCOUNTER — Emergency Department (HOSPITAL_BASED_OUTPATIENT_CLINIC_OR_DEPARTMENT_OTHER)

## 2023-11-16 ENCOUNTER — Encounter (HOSPITAL_COMMUNITY)
Admission: EM | Disposition: A | Discharge status: Home / Self Care | Payer: Self-pay | Source: Home / Self Care | Attending: Emergency Medicine

## 2023-11-16 ENCOUNTER — Emergency Department (HOSPITAL_COMMUNITY): Admitting: Anesthesiology

## 2023-11-16 ENCOUNTER — Observation Stay (HOSPITAL_BASED_OUTPATIENT_CLINIC_OR_DEPARTMENT_OTHER)
Admission: EM | Admit: 2023-11-16 | Discharge: 2023-11-17 | Disposition: A | Discharge status: Home / Self Care | Attending: Surgery | Admitting: Surgery

## 2023-11-16 ENCOUNTER — Encounter (HOSPITAL_BASED_OUTPATIENT_CLINIC_OR_DEPARTMENT_OTHER): Payer: Self-pay | Admitting: Emergency Medicine

## 2023-11-16 DIAGNOSIS — I10 Essential (primary) hypertension: Secondary | ICD-10-CM | POA: Diagnosis not present

## 2023-11-16 DIAGNOSIS — K6289 Other specified diseases of anus and rectum: Secondary | ICD-10-CM | POA: Diagnosis present

## 2023-11-16 DIAGNOSIS — J449 Chronic obstructive pulmonary disease, unspecified: Secondary | ICD-10-CM

## 2023-11-16 DIAGNOSIS — J45909 Unspecified asthma, uncomplicated: Secondary | ICD-10-CM | POA: Insufficient documentation

## 2023-11-16 DIAGNOSIS — K611 Rectal abscess: Secondary | ICD-10-CM | POA: Diagnosis not present

## 2023-11-16 DIAGNOSIS — Z79899 Other long term (current) drug therapy: Secondary | ICD-10-CM | POA: Insufficient documentation

## 2023-11-16 DIAGNOSIS — K610 Anal abscess: Principal | ICD-10-CM

## 2023-11-16 HISTORY — PX: INCISION AND DRAINAGE PERIRECTAL ABSCESS: SHX1804

## 2023-11-16 LAB — URINALYSIS, ROUTINE W REFLEX MICROSCOPIC
Bilirubin Urine: NEGATIVE
Glucose, UA: NEGATIVE mg/dL
Hgb urine dipstick: NEGATIVE
Ketones, ur: NEGATIVE mg/dL
Leukocytes,Ua: NEGATIVE
Nitrite: NEGATIVE
Protein, ur: NEGATIVE mg/dL
Specific Gravity, Urine: 1.009 (ref 1.005–1.030)
pH: 7.5 (ref 5.0–8.0)

## 2023-11-16 LAB — COMPREHENSIVE METABOLIC PANEL
ALT: 16 U/L (ref 0–44)
AST: 14 U/L — ABNORMAL LOW (ref 15–41)
Albumin: 4.1 g/dL (ref 3.5–5.0)
Alkaline Phosphatase: 67 U/L (ref 38–126)
Anion gap: 8 (ref 5–15)
BUN: 7 mg/dL (ref 6–20)
CO2: 30 mmol/L (ref 22–32)
Calcium: 9.2 mg/dL (ref 8.9–10.3)
Chloride: 100 mmol/L (ref 98–111)
Creatinine, Ser: 0.66 mg/dL (ref 0.44–1.00)
GFR, Estimated: >60 mL/min (ref >60)
Glucose, Bld: 86 mg/dL (ref 70–99)
Potassium: 3.6 mmol/L (ref 3.5–5.1)
Sodium: 138 mmol/L (ref 135–145)
Total Bilirubin: 0.7 mg/dL (ref 0.0–1.2)
Total Protein: 6.9 g/dL (ref 6.5–8.1)

## 2023-11-16 LAB — CBC
HCT: 41 % (ref 36.0–46.0)
Hemoglobin: 13.7 g/dL (ref 12.0–15.0)
MCH: 30 pg (ref 26.0–34.0)
MCHC: 33.4 g/dL (ref 30.0–36.0)
MCV: 89.9 fL (ref 80.0–100.0)
Platelets: 369 10*3/uL (ref 150–400)
RBC: 4.56 MIL/uL (ref 3.87–5.11)
RDW: 13.2 % (ref 11.5–15.5)
WBC: 16.5 10*3/uL — ABNORMAL HIGH (ref 4.0–10.5)
nRBC: 0 % (ref 0.0–0.2)

## 2023-11-16 LAB — LIPASE, BLOOD: Lipase: 16 U/L (ref 11–51)

## 2023-11-16 SURGERY — INCISION AND DRAINAGE, ABSCESS, PERIRECTAL
Anesthesia: General

## 2023-11-16 MED ORDER — DEXTROSE-SODIUM CHLORIDE 5-0.9 % IV SOLN: INTRAVENOUS | Status: DC

## 2023-11-16 MED ORDER — ATENOLOL 25 MG PO TABS
25.0000 mg | ORAL_TABLET | Freq: Every day | ORAL | Status: DC
  Administered 2023-11-17: 25 mg via ORAL
  Filled 2023-11-16: qty 1

## 2023-11-16 MED ORDER — PHENYLEPHRINE 80 MCG/ML (10ML) SYRINGE FOR IV PUSH (FOR BLOOD PRESSURE SUPPORT)
PREFILLED_SYRINGE | INTRAVENOUS | Status: DC | PRN
  Administered 2023-11-16 (×3): 160 ug via INTRAVENOUS

## 2023-11-16 MED ORDER — ONDANSETRON HCL 4 MG/2ML IJ SOLN
INTRAMUSCULAR | Status: AC
  Filled 2023-11-16: qty 2

## 2023-11-16 MED ORDER — DEXMEDETOMIDINE HCL IN NACL 80 MCG/20ML IV SOLN
INTRAVENOUS | Status: DC | PRN
Start: 2023-11-16 — End: 2023-11-16
  Administered 2023-11-16: 8 ug via INTRAVENOUS

## 2023-11-16 MED ORDER — OXYCODONE HCL 5 MG PO TABS
5.0000 mg | ORAL_TABLET | ORAL | Status: DC | PRN
  Administered 2023-11-17 (×2): 10 mg via ORAL
  Filled 2023-11-16 (×2): qty 2
  Filled 2023-11-16: qty 1

## 2023-11-16 MED ORDER — HYDROMORPHONE HCL 1 MG/ML IJ SOLN
1.0000 mg | Freq: Once | INTRAMUSCULAR | Status: AC
  Administered 2023-11-16: 1 mg via INTRAVENOUS
  Filled 2023-11-16: qty 1

## 2023-11-16 MED ORDER — HYDROMORPHONE HCL 1 MG/ML IJ SOLN
1.0000 mg | INTRAMUSCULAR | Status: DC | PRN
  Administered 2023-11-17: 1 mg via INTRAVENOUS
  Filled 2023-11-16: qty 1

## 2023-11-16 MED ORDER — DIPHENHYDRAMINE HCL 50 MG/ML IJ SOLN
INTRAMUSCULAR | Status: AC
  Filled 2023-11-16: qty 1

## 2023-11-16 MED ORDER — HYDROMORPHONE HCL 1 MG/ML IJ SOLN
INTRAMUSCULAR | Status: AC
  Filled 2023-11-16: qty 0.5

## 2023-11-16 MED ORDER — MIDAZOLAM HCL 2 MG/2ML IJ SOLN
INTRAMUSCULAR | Status: AC
  Filled 2023-11-16: qty 2

## 2023-11-16 MED ORDER — LACTATED RINGERS IV SOLN: INTRAVENOUS | Status: DC | PRN

## 2023-11-16 MED ORDER — POLYETHYLENE GLYCOL 3350 17 G PO PACK: 17.0000 g | PACK | Freq: Every day | ORAL | Status: DC | PRN

## 2023-11-16 MED ORDER — SCOPOLAMINE 1 MG/3DAYS TD PT72
MEDICATED_PATCH | TRANSDERMAL | Status: AC
  Filled 2023-11-16: qty 1

## 2023-11-16 MED ORDER — PIPERACILLIN-TAZOBACTAM 3.375 G IVPB
3.3750 g | Freq: Three times a day (TID) | INTRAVENOUS | Status: DC
  Administered 2023-11-17: 3.375 g via INTRAVENOUS
  Filled 2023-11-16: qty 50

## 2023-11-16 MED ORDER — HYDROMORPHONE HCL 1 MG/ML IJ SOLN: 0.2500 mg | INTRAMUSCULAR | Status: DC | PRN

## 2023-11-16 MED ORDER — PROPOFOL 10 MG/ML IV BOLUS
INTRAVENOUS | Status: DC | PRN
  Administered 2023-11-16: 200 mg via INTRAVENOUS

## 2023-11-16 MED ORDER — SCOPOLAMINE 1 MG/3DAYS TD PT72
MEDICATED_PATCH | TRANSDERMAL | Status: DC | PRN
  Administered 2023-11-16: 1 via TRANSDERMAL

## 2023-11-16 MED ORDER — ONDANSETRON HCL 4 MG/2ML IJ SOLN: 4.0000 mg | Freq: Four times a day (QID) | INTRAMUSCULAR | Status: DC | PRN

## 2023-11-16 MED ORDER — SUCCINYLCHOLINE CHLORIDE 200 MG/10ML IV SOSY
PREFILLED_SYRINGE | INTRAVENOUS | Status: AC
  Filled 2023-11-16: qty 10

## 2023-11-16 MED ORDER — LIDOCAINE 2% (20 MG/ML) 5 ML SYRINGE
INTRAMUSCULAR | Status: AC
  Filled 2023-11-16: qty 5

## 2023-11-16 MED ORDER — MIDAZOLAM HCL 2 MG/2ML IJ SOLN
INTRAMUSCULAR | Status: DC | PRN
  Administered 2023-11-16: 2 mg via INTRAVENOUS

## 2023-11-16 MED ORDER — ONDANSETRON 4 MG PO TBDP: 4.0000 mg | ORAL_TABLET | Freq: Four times a day (QID) | ORAL | Status: DC | PRN

## 2023-11-16 MED ORDER — OXYCODONE HCL 5 MG/5ML PO SOLN: 5.0000 mg | Freq: Once | ORAL | Status: DC | PRN

## 2023-11-16 MED ORDER — LIDOCAINE 2% (20 MG/ML) 5 ML SYRINGE
INTRAMUSCULAR | Status: DC | PRN
  Administered 2023-11-16: 40 mg via INTRAVENOUS

## 2023-11-16 MED ORDER — MIDAZOLAM HCL 2 MG/2ML IJ SOLN: 0.5000 mg | Freq: Once | INTRAMUSCULAR | Status: DC | PRN

## 2023-11-16 MED ORDER — SUGAMMADEX SODIUM 200 MG/2ML IV SOLN
INTRAVENOUS | Status: DC | PRN
  Administered 2023-11-16: 260 mg via INTRAVENOUS

## 2023-11-16 MED ORDER — OXYCODONE HCL 5 MG PO TABS: 5.0000 mg | ORAL_TABLET | Freq: Once | ORAL | Status: DC | PRN

## 2023-11-16 MED ORDER — ROCURONIUM BROMIDE 10 MG/ML (PF) SYRINGE
PREFILLED_SYRINGE | INTRAVENOUS | Status: DC | PRN
  Administered 2023-11-16: 40 mg via INTRAVENOUS

## 2023-11-16 MED ORDER — DEXMEDETOMIDINE HCL IN NACL 80 MCG/20ML IV SOLN
INTRAVENOUS | Status: AC
  Filled 2023-11-16: qty 20

## 2023-11-16 MED ORDER — MORPHINE SULFATE (PF) 4 MG/ML IV SOLN
6.0000 mg | Freq: Once | INTRAVENOUS | Status: AC
  Administered 2023-11-16: 6 mg via INTRAVENOUS
  Filled 2023-11-16: qty 2

## 2023-11-16 MED ORDER — SUGAMMADEX SODIUM 200 MG/2ML IV SOLN
INTRAVENOUS | Status: AC
Start: 2023-11-16 — End: ?
  Filled 2023-11-16: qty 2

## 2023-11-16 MED ORDER — DEXAMETHASONE SODIUM PHOSPHATE 10 MG/ML IJ SOLN
INTRAMUSCULAR | Status: AC
  Filled 2023-11-16: qty 1

## 2023-11-16 MED ORDER — ACETAMINOPHEN 10 MG/ML IV SOLN
INTRAVENOUS | Status: DC | PRN
  Administered 2023-11-16: 1000 mg via INTRAVENOUS

## 2023-11-16 MED ORDER — ROCURONIUM BROMIDE 10 MG/ML (PF) SYRINGE
PREFILLED_SYRINGE | INTRAVENOUS | Status: AC
  Filled 2023-11-16: qty 10

## 2023-11-16 MED ORDER — SUCCINYLCHOLINE CHLORIDE 200 MG/10ML IV SOSY
PREFILLED_SYRINGE | INTRAVENOUS | Status: DC | PRN
  Administered 2023-11-16: 140 mg via INTRAVENOUS

## 2023-11-16 MED ORDER — DEXAMETHASONE SODIUM PHOSPHATE 10 MG/ML IJ SOLN
INTRAMUSCULAR | Status: DC | PRN
  Administered 2023-11-16: 10 mg via INTRAVENOUS

## 2023-11-16 MED ORDER — PHENYLEPHRINE 80 MCG/ML (10ML) SYRINGE FOR IV PUSH (FOR BLOOD PRESSURE SUPPORT)
PREFILLED_SYRINGE | INTRAVENOUS | Status: AC
  Filled 2023-11-16: qty 10

## 2023-11-16 MED ORDER — PIPERACILLIN-TAZOBACTAM 3.375 G IVPB
3.3750 g | Freq: Once | INTRAVENOUS | Status: AC
  Administered 2023-11-16: 3.375 g via INTRAVENOUS
  Filled 2023-11-16: qty 50

## 2023-11-16 MED ORDER — IOHEXOL 300 MG/ML  SOLN
100.0000 mL | Freq: Once | INTRAMUSCULAR | Status: AC | PRN
  Administered 2023-11-16: 100 mL via INTRAVENOUS

## 2023-11-16 MED ORDER — ONDANSETRON HCL 4 MG/2ML IJ SOLN
INTRAMUSCULAR | Status: DC | PRN
  Administered 2023-11-16: 4 mg via INTRAVENOUS

## 2023-11-16 SURGICAL SUPPLY — 27 items
BAG COUNTER SPONGE SURGICOUNT (BAG) ×1 IMPLANT
CANISTER SUCT 3000ML PPV (MISCELLANEOUS) ×1 IMPLANT
COVER SURGICAL LIGHT HANDLE (MISCELLANEOUS) ×1 IMPLANT
DRAPE UTILITY XL STRL (DRAPES) ×1 IMPLANT
ELECT REM PT RETURN 9FT ADLT (ELECTROSURGICAL) ×1 IMPLANT
ELECTRODE REM PT RTRN 9FT ADLT (ELECTROSURGICAL) ×1 IMPLANT
GAUZE PACKING IODOFORM 1/4X15 (PACKING) IMPLANT
GAUZE PAD ABD 8X10 STRL (GAUZE/BANDAGES/DRESSINGS) ×1 IMPLANT
GAUZE SPONGE 4X4 12PLY STRL (GAUZE/BANDAGES/DRESSINGS) ×1 IMPLANT
GLOVE BIO SURGEON STRL SZ7.5 (GLOVE) ×2 IMPLANT
GLOVE BIOGEL PI IND STRL 8 (GLOVE) ×1 IMPLANT
GOWN STRL REUS W/ TWL LRG LVL3 (GOWN DISPOSABLE) ×1 IMPLANT
GOWN STRL REUS W/ TWL XL LVL3 (GOWN DISPOSABLE) ×1 IMPLANT
KIT BASIN OR (CUSTOM PROCEDURE TRAY) ×1 IMPLANT
KIT TURNOVER KIT B (KITS) ×1 IMPLANT
NS IRRIG 1000ML POUR BTL (IV SOLUTION) ×1 IMPLANT
PACK LITHOTOMY IV (CUSTOM PROCEDURE TRAY) ×1 IMPLANT
PAD ARMBOARD 7.5X6 YLW CONV (MISCELLANEOUS) ×1 IMPLANT
PENCIL SMOKE EVACUATOR (MISCELLANEOUS) ×1 IMPLANT
SPONGE T-LAP 18X18 ~~LOC~~+RFID (SPONGE) ×1 IMPLANT
SWAB COLLECTION DEVICE MRSA (MISCELLANEOUS) IMPLANT
SWAB CULTURE ESWAB REG 1ML (MISCELLANEOUS) IMPLANT
TOWEL GREEN STERILE (TOWEL DISPOSABLE) ×1 IMPLANT
TOWEL GREEN STERILE FF (TOWEL DISPOSABLE) ×1 IMPLANT
TUBE CONNECTING 12X1/4 (SUCTIONS) ×1 IMPLANT
UNDERPAD 30X36 HEAVY ABSORB (UNDERPADS AND DIAPERS) ×1 IMPLANT
YANKAUER SUCT BULB TIP NO VENT (SUCTIONS) ×1 IMPLANT

## 2023-11-16 NOTE — Transfer of Care (Signed)
 Immediate Anesthesia Transfer of Care Note  Patient: Sheryl [name]  Procedure(s) Performed: INCISION AND DRAINAGE, ABSCESS, PERIRECTAL  Patient Location: PACU  Anesthesia Type:General  Level of Consciousness: drowsy  Airway & Oxygen Therapy: Patient Spontanous Breathing and Patient connected to face mask oxygen  Post-op Assessment: Report given to RN and Post -op Vital signs reviewed and stable  Post vital signs: Reviewed and stable  Last Vitals:  Vitals Value Taken Time  BP 109/65 11/16/23 2151  Temp    Pulse 85 11/16/23 2157  Resp 15 11/16/23 2157  SpO2 96 % 11/16/23 2157  Vitals shown include unfiled device data.  Last Pain:  Vitals:   11/16/23 1935  PainSc: 8          Complications: No notable events documented.

## 2023-11-16 NOTE — Anesthesia Preprocedure Evaluation (Addendum)
 Anesthesia Evaluation  Patient identified by MRN, date of birth, ID band Patient awake    Reviewed: Allergy & Precautions, NPO status , Patient's Chart, lab work & pertinent test results  History of Anesthesia Complications (+) PONV  Airway Mallampati: I  TM Distance: >3 FB Neck ROM: Full    Dental  (+) Chipped, Missing, Dental Advisory Given   Pulmonary asthma , COPD,  COPD inhaler   breath sounds clear to auscultation       Cardiovascular hypertension, Pt. on medications and Pt. on home beta blockers (-) angina  Rhythm:Regular Rate:Normal  '19 Cath:  1. No angiographically significant coronary artery disease. 2. Normal left ventricular systolic function. 3. Upper normal LVEDP    Neuro/Psych  Headaches    GI/Hepatic negative GI ROS, Neg liver ROS,,,  Endo/Other  BMI 45  Renal/GU negative Renal ROS     Musculoskeletal   Abdominal   Peds  Hematology Hb 13.7, plt 369k   Anesthesia Other Findings   Reproductive/Obstetrics hysterectomy                             Anesthesia Physical Anesthesia Plan  ASA: 3 and emergent  Anesthesia Plan: General   Post-op Pain Management: Ofirmev IV (intra-op)*   Induction: Intravenous and Rapid sequence  PONV Risk Score and Plan: 3 and Ondansetron, Dexamethasone, Treatment may vary due to age or medical condition and Scopolamine patch - Pre-op  Airway Management Planned: Oral ETT  Additional Equipment: None  Intra-op Plan:   Post-operative Plan: Extubation in OR  Informed Consent: I have reviewed the patients History and Physical, chart, labs and discussed the procedure including the risks, benefits and alternatives for the proposed anesthesia with the patient or authorized representative who has indicated his/her understanding and acceptance.     Dental advisory given  Plan Discussed with: CRNA and Surgeon  Anesthesia Plan Comments:          Anesthesia Quick Evaluation

## 2023-11-16 NOTE — ED Triage Notes (Addendum)
 Pt via pov from home with rectal pain, constipation, abdominal pain that began Saturday and has gotten progressively worse. Last BM was yesterday, very small amount of watery stool. Pt also reports anuria (last urination was last night). Pt reports that she has significant sharp pain and pressure in the rectum. Pt alert & oriented, nad noted.

## 2023-11-16 NOTE — H&P (Signed)
 Sheryl [name] is an 53 y.o. female.   Chief Complaint: Rectal pain HPI: Patient is a 53 year old female began having rectal pain satruday.  Patient denies any history of rectal trauma or bleeding.  She denies any fevers or chills.  She states that she had used suppositories which did not bring any relief.  She has noted some abdominal bloating.  She does notice some urinary urgency.  Patient was seen in outside ER.  Patient underwent CT scan.  Patient is found to have a perirectal abscess.  This is measuring 5.1 cm on the left.  Thickening of the rectum associated with perirectal soft tissue stranding.  I did review the patient's CT scan personally.  Patient also with a leukocytosis of 16.5.  Patient was sent to [facility] for further evaluation and management.  Past Medical History:  Diagnosis Date   Allergic rhinitis    Anemia    Arthritis    bilateral knees   Asthma    Atypical chest pain 01/07/2012   Ct chest 10/2011:  No acute process, no PE to midsized pulmonary vessels.     Chronic migraine    Migraines    Miscarriage 08/1996, 12/1996   x2   Normal labor 03/19/2014   Pneumonia    PONV (postoperative nausea and vomiting)    Postoperative state 03/20/2014   SVD (spontaneous vaginal delivery)    x 2   Vitamin D deficiency     Past Surgical History:  Procedure Laterality Date   ABDOMINAL HYSTERECTOMY     BREAST REDUCTION SURGERY Bilateral 06/22/2023   Procedure: BILATERAL BREAST REDUCTION WITH LIPOSUCTION;  Surgeon: Peggye Form, DO;  Location: [facility] OR;  Service: Plastics;  Laterality: Bilateral;   BREAST SURGERY     cyst removed from left breast; benign   CESAREAN SECTION N/A 03/20/2014   Procedure: CESAREAN SECTION;  Surgeon: Loney Laurence, MD;  Location: [facility] ORS;  Service: Obstetrics;  Laterality: N/A;   cyst removed from L breast  10/2006   benign   CYSTOSCOPY N/A 06/01/2017   Procedure: CYSTOSCOPY;  Surgeon: Carrington Clamp, MD;  Location: [facility]  ORS;  Service: Gynecology;  Laterality: N/A;   DILATION AND CURETTAGE OF UTERUS  08/1996, 12/1996   x 2 MAB   DILATION AND CURETTAGE OF UTERUS     KNEE ARTHROSCOPY Right 01/2016   KNEE ARTHROSCOPY Right 08/2016   LEFT HEART CATH AND CORONARY ANGIOGRAPHY N/A 04/14/2018   Procedure: LEFT HEART CATH AND CORONARY ANGIOGRAPHY;  Surgeon: Yvonne Kendall, MD;  Location: [facility] INVASIVE CV LAB;  Service: Cardiovascular;  Laterality: N/A;   live births  10/07/97, 02/08/2000   x2   ROBOTIC ASSISTED TOTAL HYSTERECTOMY WITH SALPINGECTOMY Bilateral 06/01/2017   Procedure: ROBOTIC ASSISTED TOTAL HYSTERECTOMY WITH SALPINGECTOMY WITH PARTIAL REMOVAL OF HERNIA SAC;  Surgeon: Carrington Clamp, MD;  Location: [facility] ORS;  Service: Gynecology;  Laterality: Bilateral;   TUBAL LIGATION Bilateral 03/20/2014   Procedure: BILATERAL TUBAL LIGATION;  Surgeon: Loney Laurence, MD;  Location: [facility] ORS;  Service: Obstetrics;  Laterality: Bilateral;   UMBILICAL HERNIA REPAIR     UNILATERAL SALPINGECTOMY Right 03/20/2014   Procedure: UNILATERAL SALPINGECTOMY;  Surgeon: Loney Laurence, MD;  Location: [facility] ORS;  Service: Obstetrics;  Laterality: Right;    Family History  Problem Relation Age of Onset   Heart attack Father    Hypertension Father    Hyperlipidemia Father    Asthma Son    Heart disease Maternal Grandfather    Cancer Paternal  Grandmother        unsure what kind   Social History:  reports that she has never smoked. She has never used smokeless tobacco. She reports that she does not drink alcohol and does not use drugs.  Allergies:  Allergies  Allergen Reactions   Aspirin Anaphylaxis and Hives   Salicylates Anaphylaxis and Hives    Unknown reaction Sulfur meds   Codeine Other (See Comments)    Migraines, anxiety    Fentanyl Nausea And Vomiting    Migraine    (Not in a hospital admission)   Results for orders placed or performed during the hospital encounter of 11/16/23 (from the past 48 hours)   Urinalysis, Routine w reflex microscopic -Urine, Unspecified Source     Status: None   Collection Time: 11/16/23 11:52 AM  Result Value Ref Range   Color, Urine YELLOW YELLOW   APPearance CLEAR CLEAR   Specific Gravity, Urine 1.009 1.005 - 1.030   pH 7.5 5.0 - 8.0   Glucose, UA NEGATIVE NEGATIVE mg/dL   Hgb urine dipstick NEGATIVE NEGATIVE   Bilirubin Urine NEGATIVE NEGATIVE   Ketones, ur NEGATIVE NEGATIVE mg/dL   Protein, ur NEGATIVE NEGATIVE mg/dL   Nitrite NEGATIVE NEGATIVE   Leukocytes,Ua NEGATIVE NEGATIVE    Comment: Performed at [facility], [address], [place], [place] [address]  CBC     Status: Abnormal   Collection Time: 11/16/23 11:52 AM  Result Value Ref Range   WBC 16.5 (H) 4.0 - 10.5 K/uL   RBC 4.56 3.87 - 5.11 MIL/uL   Hemoglobin 13.7 12.0 - 15.0 g/dL   HCT 57.8 46.9 - 62.9 %   MCV 89.9 80.0 - 100.0 fL   MCH 30.0 26.0 - 34.0 pg   MCHC 33.4 30.0 - 36.0 g/dL   RDW 52.8 41.3 - 24.4 %   Platelets 369 150 - 400 K/uL   nRBC 0.0 0.0 - 0.2 %    Comment: Performed at [facility], [address], [place], [place] [address]  Comprehensive metabolic panel     Status: Abnormal   Collection Time: 11/16/23  1:31 PM  Result Value Ref Range   Sodium 138 135 - 145 mmol/L   Potassium 3.6 3.5 - 5.1 mmol/L   Chloride 100 98 - 111 mmol/L   CO2 30 22 - 32 mmol/L   Glucose, Bld 86 70 - 99 mg/dL    Comment: Glucose reference range applies only to samples taken after fasting for at least 8 hours.   BUN 7 6 - 20 mg/dL   Creatinine, Ser 2.53 0.44 - 1.00 mg/dL   Calcium 9.2 8.9 - 66.4 mg/dL   Total Protein 6.9 6.5 - 8.1 g/dL   Albumin 4.1 3.5 - 5.0 g/dL   AST 14 (L) 15 - 41 U/L   ALT 16 0 - 44 U/L   Alkaline Phosphatase 67 38 - 126 U/L   Total Bilirubin 0.7 0.0 - 1.2 mg/dL   GFR, Estimated >40 >34 mL/min    Comment: (NOTE) Calculated using the CKD-EPI Creatinine Equation (2021)    Anion gap 8 5 - 15    Comment: Performed at [facility], [address], [place], [place] [address]  Lipase, blood     Status: None   Collection Time: 11/16/23  1:31 PM  Result Value Ref Range   Lipase 16 11 - 51 U/L    Comment: Performed at [facility], [address] [place], [place] [address]  CT ABDOMEN PELVIS W CONTRAST Result Date: 11/16/2023 CLINICAL DATA:  Nonlocalized abdomen pain, rectal pain and constipation EXAM: CT ABDOMEN AND PELVIS WITH CONTRAST TECHNIQUE: Multidetector CT imaging of the abdomen and pelvis was performed using the standard protocol following bolus administration of intravenous contrast. RADIATION DOSE REDUCTION: This exam was performed according to the departmental dose-optimization program which includes automated exposure control, adjustment of the mA and/or kV according to patient size and/or use of iterative reconstruction technique. CONTRAST:  OMNIPAQUE IOHEXOL 300 MG/ML  SOLN COMPARISON:  CT 08/13/2021 FINDINGS: Lower chest: Lung bases are clear Hepatobiliary: No focal liver abnormality is seen. No gallstones, gallbladder wall thickening, or biliary dilatation. Pancreas: Unremarkable. No pancreatic ductal dilatation or surrounding inflammatory changes. Spleen: Normal in size without focal abnormality. Adrenals/Urinary Tract: Adrenal glands are unremarkable. Kidneys are normal, without renal calculi, focal lesion, or hydronephrosis. Bladder is unremarkable. Stomach/Bowel: Stomach nonenlarged. No dilated small bowel. Negative appendix. Perirectal soft tissue stranding and edema. 5.1 by 3.5 cm rim enhancing collection at the left posterior perianal region consistent with an abscess. Vascular/Lymphatic: No significant vascular findings are present. No enlarged abdominal or pelvic lymph nodes. Reproductive: Status post hysterectomy. No adnexal masses. Other: Negative for pelvic effusion or free air Musculoskeletal: No acute osseous abnormality. IMPRESSION: 5.1 cm left posterior  perianal abscess with mild wall thickening of the rectum and associated perirectal soft tissue stranding and edema. Electronically Signed   By: Jasmine Pang M.D.   On: 11/16/2023 18:31    Review of Systems  Constitutional:  Negative for chills and fever.  HENT:  Negative for ear discharge, hearing loss and sore throat.   Eyes:  Negative for discharge.  Respiratory:  Negative for cough and shortness of breath.   Cardiovascular:  Negative for chest pain and leg swelling.  Gastrointestinal:  Negative for abdominal pain, constipation, diarrhea, nausea and vomiting.  Musculoskeletal:  Negative for myalgias and neck pain.  Skin:  Negative for rash.  Allergic/Immunologic: Negative for environmental allergies.  Neurological:  Negative for dizziness and seizures.  Hematological:  Does not bruise/bleed easily.  Psychiatric/Behavioral:  Negative for suicidal ideas.   All other systems reviewed and are negative.   Blood pressure 115/71, pulse 80, temperature 98.5 F (36.9 C), resp. rate 16, height 5\' 7"  (1.702 m), weight 129.7 kg, last menstrual period 05/26/2017, SpO2 99%, unknown if currently breastfeeding.   PE:  Constitutional: No acute distress, conversant, appears states age. Eyes: Anicteric sclerae, moist conjunctiva, no lid lag Lungs: Clear to auscultation bilaterally, normal respiratory effort CV: regular rate and rhythm, no murmurs, no peripheral edema, pedal pulses 2+ GI: Soft, no masses or hepatosplenomegaly, non-tender to palpation GU: Rectal exam deferred secondary to pain Skin: No rashes, palpation reveals normal turgor Psychiatric: appropriate judgment and insight, oriented to person, place, and time   Assessment/Plan 53 year old female with perirectal abscess 1.  Will proceed to the operating room for I&D of abscess.  Based on positioning of this can be drained internally she may be discharged today with antibiotics. 2.  I discussed with her the risk and benefits of the  procedure to include but limited to: Infection, bleeding, damage surrounding structures, possible need for the surgery.  Patient voiced understanding wished to proceed.  Axel Filler, MD 11/16/2023, 8:02 PM

## 2023-11-16 NOTE — Progress Notes (Signed)
 Pharmacy Antibiotic Note  Sheryl [name] is a 53 y.o. female admitted on 11/16/2023 with rectal abscess now s/p I&D.  Pharmacy has been consulted for Zosyn dosing.  Plan: Zosyn 3.375g IV q8h (4 hour infusion).  Height: 5\' 7"  (170.2 cm) Weight: 128.1 kg (282 lb 6.6 oz) IBW/kg (Calculated) : 61.6  Temp (24hrs), Avg:98.5 F (36.9 C), Min:97.8 F (36.6 C), Max:98.7 F (37.1 C)  Recent Labs  Lab 11/16/23 1152 11/16/23 1331  WBC 16.5*  --   CREATININE  --  0.66    Estimated Creatinine Clearance: 115.8 mL/min (by C-G formula based on SCr of 0.66 mg/dL).    Allergies  Allergen Reactions   Aspirin Anaphylaxis and Hives   Salicylates Anaphylaxis and Hives    Unknown reaction Sulfur meds   Codeine Other (See Comments)    Migraines, anxiety    Fentanyl Nausea And Vomiting    Migraine    Thank you for allowing pharmacy to be a part of this patient's care.  Vernard Gambles, PharmD, BCPS  11/16/2023 10:46 PM

## 2023-11-16 NOTE — ED Notes (Signed)
 Pt had a few ice chips appx 1800, otherwise last food was 2 club crackers at 0530 this morning

## 2023-11-16 NOTE — Op Note (Signed)
 11/16/2023  9:30 PM  PATIENT:  Sheryl [name]  53 y.o. female  PRE-OPERATIVE DIAGNOSIS:  rectal abscess  POST-OPERATIVE DIAGNOSIS:  rectal abscess  PROCEDURE:  Procedure(s) with comments: INCISION AND DRAINAGE, ABSCESS, PERIRECTAL (N/A) - lithotomy  SURGEON:  Surgeons and Role:    Axel Filler, MD - Primary  ASSISTANTS: none   ANESTHESIA:   general  EBL:  10 cc  BLOOD ADMINISTERED:none  DRAINS: none   LOCAL MEDICATIONS USED:  NONE  SPECIMEN:  Source of Specimen: Perirectal abscess  DISPOSITION OF SPECIMEN: Microbiology  COUNTS:  YES  TOURNIQUET:  * No tourniquets in log *  DICTATION: .Dragon Dictation Indication procedure: Patient is a 53 year old female who was found to have a perirectal abscess on CT scan after having several days of perirectal pain.  Patient was counseled and taken to the operating room emergently for I&D of abscess.  Findings: Patient with a significant amount of foul-smelling purulence.  Anaerobic and aerobic cultures were sent.  Half-inch iodoform gauze was packed into the wound.  Details of procedure: As the patient was consented she was taken back to the OR and placed supine position with bilateral SCDs in place.  Patient underwent general endotracheal intubation.  Patient was then placed in the lithotomy position.  Patient was then prepped and draped in standard fashion.  A timeout was called and all facts verified.  At this time a digital rectal exam began the case.  I was able to feel a left-sided firmness and induration.  There was also an area externally which had fluctuance.  At this time this was incised.  Large amount of foul-smelling purulence was expressed.  Both aerobic and anaerobic cultures were obtained.  A hemostat was then used to extend the skin incision.  At this time a finger was used to break up all loculations.  The area was irrigated out with sterile saline.  At this time half-inch iodoform gauze was then packed into the  wound tightly to help with hemostasis.  The wound was then dressed with 4 x 4's, ABD pad and mesh undergarments.  Patient Toller procedure well was taken to the recovery in stable condition.   PLAN OF CARE: Admit for overnight observation  PATIENT DISPOSITION:  PACU - hemodynamically stable.   Delay start of Pharmacological VTE agent (>24hrs) due to surgical blood loss or risk of bleeding: yes

## 2023-11-16 NOTE — ED Notes (Signed)
Bladder scan: 388 mL. 

## 2023-11-16 NOTE — Anesthesia Procedure Notes (Signed)
 Procedure Name: Intubation Date/Time: 11/16/2023 9:07 PM  Performed by: Tressia Miners, CRNAPre-anesthesia Checklist: Patient identified, Emergency Drugs available, Suction available, Patient being monitored and Timeout performed Patient Re-evaluated:Patient Re-evaluated prior to induction Oxygen Delivery Method: Circle system utilized Preoxygenation: Pre-oxygenation with 100% oxygen Induction Type: IV induction, Rapid sequence and Cricoid Pressure applied Ventilation: Mask ventilation without difficulty Laryngoscope Size: Mac and 4 Grade View: Grade I Tube type: Oral Tube size: 7.0 mm Number of attempts: 1 Airway Equipment and Method: Stylet Placement Confirmation: ETT inserted through vocal cords under direct vision, positive ETCO2 and breath sounds checked- equal and bilateral Secured at: 23 cm Tube secured with: Tape Dental Injury: Teeth and Oropharynx as per pre-operative assessment  Comments: Smooth IV Induction. Eyes taped. RSI Performed. DL x 1 with grade 1 view. Atraumatically placed, teeth and lip remain intact as pre-op. Secured with tape. Bilateral breath sounds +/=, EtCO2 +, Adequate TV, VSS.

## 2023-11-16 NOTE — Anesthesia Postprocedure Evaluation (Signed)
 Anesthesia Post Note  Patient: Sheryl [name]  Procedure(s) Performed: INCISION AND DRAINAGE, ABSCESS, PERIRECTAL     Patient location during evaluation: PACU Anesthesia Type: General Level of consciousness: sedated, oriented and patient cooperative Pain management: pain level controlled Vital Signs Assessment: post-procedure vital signs reviewed and stable Respiratory status: spontaneous breathing, nonlabored ventilation, respiratory function stable and patient connected to nasal cannula oxygen Cardiovascular status: blood pressure returned to baseline and stable Postop Assessment: no apparent nausea or vomiting Anesthetic complications: no   No notable events documented.  Last Vitals:  Vitals:   11/16/23 2151 11/16/23 2200  BP: 109/65 112/67  Pulse: 82 90  Resp: 17 16  Temp: 37.1 C   SpO2: 100% 95%    Last Pain:  Vitals:   11/16/23 2151  PainSc: Asleep                 Danay Mckellar,E. Micah Galeno

## 2023-11-16 NOTE — ED Provider Notes (Signed)
  [facility] AT [facility] Provider Note   CSN: [id number] Arrival date & time: 11/16/23  1121     History  Chief Complaint  Patient presents with   Rectal Pain   Abdominal Pain        Constipation    Sheryl [name] is a 53 y.o. female.  53 year old female who presents with rectal pain which started several days ago.  Patient denies any history of rectal trauma.  No bleeding appreciated.  No fever or chills.  No prior history of same.  Used suppositories without relief.  Also notes some abdominal bloating.  Endorses some urinary urgency.  No treatment use prior to arrival       Home Medications Prior to Admission medications   Medication Sig Start Date End Date Taking? Authorizing Provider  acetaminophen (TYLENOL) 500 MG tablet Take 500-1,000 mg by mouth every 6 (six) hours as needed (for pain/headaches.).    [provider]  albuterol (PROVENTIL) (2.5 MG/3ML) 0.083% nebulizer solution Take 2.5 mg by nebulization every 6 (six) hours as needed for shortness of breath.    [provider]  albuterol (VENTOLIN HFA) 108 (90 Base) MCG/ACT inhaler Inhale 2 puffs into the lungs every 6 (six) hours as needed for wheezing or shortness of breath. 05/21/20   [provider]  ascorbic acid (VITAMIN C) 500 MG tablet Take 500 mg by mouth daily.    [provider]  atenolol (TENORMIN) 25 MG tablet Take 1 tablet (25 mg total) by mouth daily. 08/17/23   Glean Salvo, NP  cholecalciferol (VITAMIN D3) 25 MCG (1000 UNIT) tablet Take 1,000 Units by mouth daily. 12/15/16   [provider]  cyanocobalamin (VITAMIN B12) 1000 MCG tablet Take 1,000 mcg by mouth daily.    [provider]  diphenhydrAMINE-zinc acetate (BENADRYL) cream Apply 1 Application topically 3 (three) times daily as needed for itching.    [provider]  fexofenadine-pseudoephedrine (ALLEGRA-D 24) 180-240 MG 24 hr tablet Take 1 tablet by mouth  daily.    [provider]  fluticasone (FLONASE) 50 MCG/ACT nasal spray Place 2 sprays into both nostrils daily.    [provider]  ipratropium-albuterol (DUONEB) 0.5-2.5 (3) MG/3ML SOLN Take 3 mLs by nebulization every 6 (six) hours as needed (severe shortness of breath).    [provider]  Melatonin 10 MG TABS Take 10 mg by mouth at bedtime.    [provider]  ondansetron (ZOFRAN) 4 MG tablet Take 1 tablet (4 mg total) by mouth every 8 (eight) hours as needed for up to 20 doses for nausea or vomiting. 06/13/23   Laurena Spies, PA-C  oxyCODONE (ROXICODONE) 5 MG immediate release tablet Take 1 tablet (5 mg total) by mouth every 6 (six) hours as needed for up to 4 doses for severe pain (pain score 7-10). Patient not taking: Reported on 09/13/2023 07/01/23   Laurena Spies, PA-C  zolmitriptan (ZOMIG) 5 MG tablet TAKE 1 TABLET BY MOUTH AT ONSET OF MIGRAINE. MAY REPEAT IN 2 HOURS IF NEEDED. MAX 2 IN 24 HOURS 08/16/23   Glean Salvo, NP      Allergies    Aspirin, Salicylates, Codeine, and Fentanyl    Review of Systems   Review of Systems  All other systems reviewed and are negative.   Physical Exam Updated Vital Signs BP 123/83 (BP Location: Right Arm)   Pulse 76   Temp 98.7 F (37.1 C)   Resp 18  Ht 1.702 m (5\' 7" )   Wt 129.7 kg   LMP 05/26/2017 (Exact Date)   SpO2 99%   BMI 44.79 kg/m  Physical Exam Vitals and nursing note reviewed. Exam conducted with a chaperone present.  Constitutional:      General: She is not in acute distress.    Appearance: Normal appearance. She is well-developed. She is not toxic-appearing.  HENT:     Head: Normocephalic and atraumatic.  Eyes:     General: Lids are normal.     Conjunctiva/sclera: Conjunctivae normal.     Pupils: Pupils are equal, round, and reactive to light.  Neck:     Thyroid: No thyroid mass.     Trachea: No tracheal deviation.  Cardiovascular:     Rate and Rhythm: Normal rate and  regular rhythm.     Heart sounds: Normal heart sounds. No murmur heard.    No gallop.  Pulmonary:     Effort: Pulmonary effort is normal. No respiratory distress.     Breath sounds: Normal breath sounds. No stridor. No decreased breath sounds, wheezing, rhonchi or rales.  Abdominal:     General: There is no distension.     Palpations: Abdomen is soft.     Tenderness: There is no abdominal tenderness. There is no rebound.  Genitourinary:    Rectum: Tenderness present.  Musculoskeletal:        General: No tenderness. Normal range of motion.     Cervical back: Normal range of motion and neck supple.  Skin:    General: Skin is warm and dry.     Findings: No abrasion or rash.  Neurological:     Mental Status: She is alert and oriented to person, place, and time. Mental status is at baseline.     GCS: GCS eye subscore is 4. GCS verbal subscore is 5. GCS motor subscore is 6.     Cranial Nerves: No cranial nerve deficit.     Sensory: No sensory deficit.     Motor: Motor function is intact.  Psychiatric:        Attention and Perception: Attention normal.        Speech: Speech normal.        Behavior: Behavior normal.     ED Results / Procedures / Treatments   Labs (all labs ordered are listed, but only abnormal results are displayed) Labs Reviewed  URINALYSIS, ROUTINE W REFLEX MICROSCOPIC  CBC  LIPASE, BLOOD  COMPREHENSIVE METABOLIC PANEL    EKG None  Radiology No results found.  Procedures Procedures    Medications Ordered in ED Medications - No data to display  ED Course/ Medical Decision Making/ A&P                                 Medical Decision Making Amount and/or Complexity of Data Reviewed Labs: ordered. Radiology: ordered.  Risk Prescription drug management.   Patient here with severe pain concern for possible rectal abscess.  Has elevated white count.  Medicated for pain here.  Abdominal CT showed evidence of perianal abscess measuring 5 cm.  Case  discussed with Dr. Derrell Lolling from general surgery.  Recommends placing patient on Zosyn and transferring patient to preop [facility].  Discussed this plan and findings with patient as well as family who are at bedside.  Will transfer patient        Final Clinical Impression(s) / ED Diagnoses Final diagnoses:  None    Rx / DC Orders ED Discharge Orders     None         Lorre Nick, MD 11/16/23 1919

## 2023-11-17 ENCOUNTER — Encounter (HOSPITAL_COMMUNITY): Payer: Self-pay | Admitting: General Surgery

## 2023-11-17 LAB — CBC
HCT: 41 % (ref 36.0–46.0)
Hemoglobin: 13.7 g/dL (ref 12.0–15.0)
MCH: 30.2 pg (ref 26.0–34.0)
MCHC: 33.4 g/dL (ref 30.0–36.0)
MCV: 90.3 fL (ref 80.0–100.0)
Platelets: 347 10*3/uL (ref 150–400)
RBC: 4.54 MIL/uL (ref 3.87–5.11)
RDW: 13 % (ref 11.5–15.5)
WBC: 16 10*3/uL — ABNORMAL HIGH (ref 4.0–10.5)
nRBC: 0 % (ref 0.0–0.2)

## 2023-11-17 LAB — BASIC METABOLIC PANEL
Anion gap: 7 (ref 5–15)
BUN: 7 mg/dL (ref 6–20)
CO2: 26 mmol/L (ref 22–32)
Calcium: 8.6 mg/dL — ABNORMAL LOW (ref 8.9–10.3)
Chloride: 105 mmol/L (ref 98–111)
Creatinine, Ser: 0.7 mg/dL (ref 0.44–1.00)
GFR, Estimated: >60 mL/min (ref >60)
Glucose, Bld: 184 mg/dL — ABNORMAL HIGH (ref 70–99)
Potassium: 4 mmol/L (ref 3.5–5.1)
Sodium: 138 mmol/L (ref 135–145)

## 2023-11-17 MED ORDER — DOCUSATE SODIUM 100 MG PO CAPS: 100.0000 mg | ORAL_CAPSULE | Freq: Two times a day (BID) | ORAL | 0 refills | Status: AC

## 2023-11-17 MED ORDER — AMOXICILLIN-POT CLAVULANATE 875-125 MG PO TABS: 1.0000 | ORAL_TABLET | Freq: Two times a day (BID) | ORAL | 0 refills | Status: AC

## 2023-11-17 MED ORDER — HYDROCODONE-ACETAMINOPHEN 5-325 MG PO TABS: 1.0000 | ORAL_TABLET | Freq: Four times a day (QID) | ORAL | 0 refills | Status: AC | PRN

## 2023-11-17 NOTE — Discharge Summary (Signed)
 [facility] Discharge Summary   Patient ID: Sheryl [name] MRN: [id number] DOB/AGE: Mar 25, 1971 53 y.o.  Admit date: 11/16/2023 Discharge date: 11/17/2023  Admitting Diagnosis: Perirectal abscess  Discharge Diagnosis S/P I&D of perirectal abscess  Consultants None   Imaging: CT ABDOMEN PELVIS W CONTRAST Result Date: 11/16/2023 CLINICAL DATA:  Nonlocalized abdomen pain, rectal pain and constipation EXAM: CT ABDOMEN AND PELVIS WITH CONTRAST TECHNIQUE: Multidetector CT imaging of the abdomen and pelvis was performed using the standard protocol following bolus administration of intravenous contrast. RADIATION DOSE REDUCTION: This exam was performed according to the departmental dose-optimization program which includes automated exposure control, adjustment of the mA and/or kV according to patient size and/or use of iterative reconstruction technique. CONTRAST:  OMNIPAQUE IOHEXOL 300 MG/ML  SOLN COMPARISON:  CT 08/13/2021 FINDINGS: Lower chest: Lung bases are clear Hepatobiliary: No focal liver abnormality is seen. No gallstones, gallbladder wall thickening, or biliary dilatation. Pancreas: Unremarkable. No pancreatic ductal dilatation or surrounding inflammatory changes. Spleen: Normal in size without focal abnormality. Adrenals/Urinary Tract: Adrenal glands are unremarkable. Kidneys are normal, without renal calculi, focal lesion, or hydronephrosis. Bladder is unremarkable. Stomach/Bowel: Stomach nonenlarged. No dilated small bowel. Negative appendix. Perirectal soft tissue stranding and edema. 5.1 by 3.5 cm rim enhancing collection at the left posterior perianal region consistent with an abscess. Vascular/Lymphatic: No significant vascular findings are present. No enlarged abdominal or pelvic lymph nodes. Reproductive: Status post hysterectomy. No adnexal masses. Other: Negative for pelvic effusion or free air Musculoskeletal: No acute osseous abnormality. IMPRESSION: 5.1 cm left  posterior perianal abscess with mild wall thickening of the rectum and associated perirectal soft tissue stranding and edema. Electronically Signed   By: Jasmine Pang M.D.   On: 11/16/2023 18:31    Procedures Dr. Axel Filler (11/16/23) - Incision and drainage  Hospital Course:  Patient is a 52 year old female who presented to the ED with rectal pain.  Workup showed perirectal abscess.  Patient was admitted and underwent procedure listed above.  Tolerated procedure well and was transferred to the floor.  Diet was advanced as tolerated.  On POD1, the patient was voiding well, tolerating diet, ambulating well, pain well controlled, vital signs stable, packing removed and felt stable for discharge home.  Patient will follow up in our office in 2 weeks and knows to call with questions or concerns.  She will call to confirm appointment date/time.    Physical Exam: General:  Alert, NAD, pleasant, comfortable Abd:  Soft, ND, NT GU: packing removed from left perianal incision with minimal purulence, mild erythema surrounding, cavity extends ~4 cm  I or a member of my team have reviewed this patient in the Controlled Substance Database.   Allergies as of 11/17/2023       Reactions   Aspirin Anaphylaxis, Hives   Salicylates Anaphylaxis, Hives   Unknown reaction Sulfur meds   Codeine Other (See Comments)   Migraines, anxiety    Fentanyl Nausea And Vomiting   Migraine        Medication List     STOP taking these medications    oxyCODONE 5 MG immediate release tablet Commonly known as: Roxicodone       TAKE these medications    acetaminophen 500 MG tablet Commonly known as: TYLENOL Take 500-1,000 mg by mouth every 6 (six) hours as needed (for pain/headaches.).   albuterol (2.5 MG/3ML) 0.083% nebulizer solution Commonly known as: PROVENTIL Take 2.5 mg by nebulization every 6 (six) hours as needed for shortness of  breath.   albuterol 108 (90 Base) MCG/ACT inhaler Commonly  known as: VENTOLIN HFA Inhale 2 puffs into the lungs every 6 (six) hours as needed for wheezing or shortness of breath.   amoxicillin-clavulanate 875-125 MG tablet Commonly known as: AUGMENTIN Take 1 tablet by mouth 2 (two) times daily for 5 days.   ascorbic acid 500 MG tablet Commonly known as: VITAMIN C Take 500 mg by mouth daily.   atenolol 25 MG tablet Commonly known as: TENORMIN Take 1 tablet (25 mg total) by mouth daily.   cholecalciferol 25 MCG (1000 UNIT) tablet Commonly known as: VITAMIN D3 Take 1,000 Units by mouth daily.   cyanocobalamin 1000 MCG tablet Commonly known as: VITAMIN B12 Take 1,000 mcg by mouth daily.   diphenhydrAMINE-zinc acetate cream Commonly known as: BENADRYL Apply 1 Application topically 3 (three) times daily as needed for itching.   docusate sodium 100 MG capsule Commonly known as: Colace Take 1 capsule (100 mg total) by mouth 2 (two) times daily for 5 days.   fexofenadine-pseudoephedrine 180-240 MG 24 hr tablet Commonly known as: ALLEGRA-D 24 Take 1 tablet by mouth daily.   fluticasone 50 MCG/ACT nasal spray Commonly known as: FLONASE Place 2 sprays into both nostrils daily.   HYDROcodone-acetaminophen 5-325 MG tablet Commonly known as: NORCO/VICODIN Take 1 tablet by mouth every 6 (six) hours as needed for moderate pain (pain score 4-6).   ipratropium-albuterol 0.5-2.5 (3) MG/3ML Soln Commonly known as: DUONEB Take 3 mLs by nebulization every 6 (six) hours as needed (severe shortness of breath).   Melatonin 10 MG Tabs Take 10 mg by mouth at bedtime.   ondansetron 4 MG tablet Commonly known as: Zofran Take 1 tablet (4 mg total) by mouth every 8 (eight) hours as needed for up to 20 doses for nausea or vomiting.   zolmitriptan 5 MG tablet Commonly known as: ZOMIG TAKE 1 TABLET BY MOUTH AT ONSET OF MIGRAINE. MAY REPEAT IN 2 HOURS IF NEEDED. MAX 2 IN 24 HOURS          Follow-up Information     Maczis, Hedda Slade, [place]. Go on  12/02/2023.   Specialty: General Surgery Why: 9:45 AM, please arrive 30 min prior to appointment time for check in. Contact information: [address] [place] [address] [facility] [place] [address] [phone]                 Signed: Juliet Rude , [facility] 11/17/2023, 10:20 AM Please see Amion for pager number during day hours 7:00am-4:30pm

## 2023-11-17 NOTE — Plan of Care (Signed)
   Problem: Clinical Measurements: Goal: Ability to maintain clinical measurements within normal limits will improve Outcome: Progressing

## 2023-11-17 NOTE — Progress Notes (Signed)
 Sheryl [name] to be D/C'd  per MD order.  Discussed with the patient and all questions fully answered.  VSS, Skin clean, dry and intact without evidence of skin break down, no evidence of skin tears noted.  IV catheter discontinued intact. Site without signs and symptoms of complications. Dressing and pressure applied.  An After Visit Summary was printed and given to the patient.   D/c education completed with patient/family including follow up instructions, medication list, d/c activities limitations if indicated, with other d/c instructions as indicated by MD - patient able to verbalize understanding, all questions fully answered.   Patient instructed to return to ED, call 911, or call MD for any changes in condition.   Patient to be escorted via WC, and D/C home via private auto.

## 2023-11-17 NOTE — Plan of Care (Signed)
  Problem: Education: Goal: Knowledge of General Education information will improve Description: Including pain rating scale, medication(s)/side effects and non-pharmacologic comfort measures Outcome: Adequate for Discharge   Problem: Health Behavior/Discharge Planning: Goal: Ability to manage health-related needs will improve Outcome: Adequate for Discharge   Problem: Clinical Measurements: Goal: Ability to maintain clinical measurements within normal limits will improve Outcome: Adequate for Discharge Goal: Will remain free from infection Outcome: Adequate for Discharge Goal: Diagnostic test results will improve Outcome: Adequate for Discharge Goal: Respiratory complications will improve Outcome: Adequate for Discharge Goal: Cardiovascular complication will be avoided Outcome: Adequate for Discharge   Problem: Coping: Goal: Level of anxiety will decrease Outcome: Adequate for Discharge   Problem: Nutrition: Goal: Adequate nutrition will be maintained Outcome: Adequate for Discharge   Problem: Activity: Goal: Risk for activity intolerance will decrease Outcome: Adequate for Discharge   Problem: Pain Managment: Goal: General experience of comfort will improve and/or be controlled Outcome: Adequate for Discharge   Problem: Safety: Goal: Ability to remain free from injury will improve Outcome: Adequate for Discharge   Problem: Skin Integrity: Goal: Risk for impaired skin integrity will decrease Outcome: Adequate for Discharge

## 2023-11-20 LAB — AEROBIC/ANAEROBIC CULTURE W GRAM STAIN (SURGICAL/DEEP WOUND)

## 2023-12-20 LAB — FUNGUS CULTURE WITH STAIN

## 2023-12-20 LAB — FUNGAL ORGANISM REFLEX

## 2023-12-20 LAB — FUNGUS CULTURE RESULT

## 2024-02-13 ENCOUNTER — Encounter: Payer: Self-pay | Admitting: Podiatry

## 2024-02-13 ENCOUNTER — Ambulatory Visit (INDEPENDENT_AMBULATORY_CARE_PROVIDER_SITE_OTHER): Admitting: Podiatry

## 2024-02-13 ENCOUNTER — Ambulatory Visit (INDEPENDENT_AMBULATORY_CARE_PROVIDER_SITE_OTHER)

## 2024-02-13 VITALS — Ht 67.0 in | Wt 282.0 lb

## 2024-02-13 DIAGNOSIS — M778 Other enthesopathies, not elsewhere classified: Secondary | ICD-10-CM

## 2024-02-13 DIAGNOSIS — M7751 Other enthesopathy of right foot: Secondary | ICD-10-CM | POA: Diagnosis not present

## 2024-02-13 MED ORDER — TRIAMCINOLONE ACETONIDE 10 MG/ML IJ SUSP
10.0000 mg | Freq: Once | INTRAMUSCULAR | Status: AC
  Administered 2024-02-13: 10 mg via INTRA_ARTICULAR

## 2024-02-15 NOTE — Progress Notes (Signed)
 Subjective:   Patient ID: Sheryl [name], female   DOB: 53 y.o.   MRN: [id number]   HPI Patient presents stating that she is having severe discomfort in her forefoot right and feels like there is fluid buildup around the joint and this has been going about a month does not remember injury.  Have this on the left before and it was treated and went away.  Patient does not smoke likes to be active   Review of Systems  All other systems reviewed and are negative.       Objective:  Physical Exam Vitals and nursing note reviewed.  Constitutional:      Appearance: She is well-developed.  Pulmonary:     Effort: Pulmonary effort is normal.  Musculoskeletal:        General: Normal range of motion.  Skin:    General: Skin is warm.  Neurological:     Mental Status: She is alert.     Neurovascular status intact muscle strength found to be adequate range of motion adequate exquisite discomfort second metatarsal phalangeal joint right fluid buildup around the joint reduced range of motion of the joint mild bunion deformity noted left doing well.  Good digital perfusion well-oriented x 3     Assessment:  Acute inflammatory capsulitis second MPJ right fluid buildup around the joint     Plan:  H&P x-rays reviewed condition discussed.  I went ahead today did proximal nerve block 60 mg Xylocaine  Marcaine  mixture and I aspirated the joint getting a small amount of blood indicating it was violated but should heal and applied and injected with quarter cc dexamethasone  Kenalog .  Applied sterile dressing applied plantar padding wear rigid bottom shoes reappoint to recheck  X-rays indicate no signs of arthritis or fracture of the joint

## 2024-02-27 ENCOUNTER — Ambulatory Visit (INDEPENDENT_AMBULATORY_CARE_PROVIDER_SITE_OTHER): Admitting: Podiatry

## 2024-02-27 ENCOUNTER — Encounter: Payer: Self-pay | Admitting: Podiatry

## 2024-02-27 VITALS — Ht 67.0 in | Wt 282.0 lb

## 2024-02-27 DIAGNOSIS — M7751 Other enthesopathy of right foot: Secondary | ICD-10-CM

## 2024-02-28 NOTE — Progress Notes (Signed)
 Subjective:   Patient ID: Sheryl [name], female   DOB: 53 y.o.   MRN: [id number]   HPI Patient states she is doing quite a bit better on her right foot and states the pain has reduced neuro   ROS      Objective:  Physical Exam  Vascular status intact inflammation around the second MPJ right which is quite a bit better than it was previously with mild inflammation still noted     Assessment:  Improving from capsulitis like symptoms second MPJ right     Plan:  H&P reviewed I discussed rigid bottom shoes which I think will be helpful for her and may require orthotics ultimately may require shortening osteotomy or other treatment depending on how it does

## 2024-04-03 ENCOUNTER — Other Ambulatory Visit: Payer: Self-pay

## 2024-04-03 MED ORDER — ATENOLOL 25 MG PO TABS: 25.0000 mg | ORAL_TABLET | Freq: Every day | ORAL | 0 refills | Status: DC

## 2024-07-06 ENCOUNTER — Emergency Department (HOSPITAL_BASED_OUTPATIENT_CLINIC_OR_DEPARTMENT_OTHER)

## 2024-07-06 ENCOUNTER — Emergency Department (HOSPITAL_BASED_OUTPATIENT_CLINIC_OR_DEPARTMENT_OTHER)
Admission: EM | Admit: 2024-07-06 | Discharge: 2024-07-06 | Disposition: A | Discharge status: Home / Self Care | Source: Ambulatory Visit | Attending: Emergency Medicine | Admitting: Emergency Medicine

## 2024-07-06 ENCOUNTER — Encounter (HOSPITAL_BASED_OUTPATIENT_CLINIC_OR_DEPARTMENT_OTHER): Payer: Self-pay

## 2024-07-06 ENCOUNTER — Other Ambulatory Visit: Payer: Self-pay

## 2024-07-06 DIAGNOSIS — J45909 Unspecified asthma, uncomplicated: Secondary | ICD-10-CM | POA: Insufficient documentation

## 2024-07-06 DIAGNOSIS — Z79899 Other long term (current) drug therapy: Secondary | ICD-10-CM | POA: Insufficient documentation

## 2024-07-06 DIAGNOSIS — K6289 Other specified diseases of anus and rectum: Secondary | ICD-10-CM | POA: Insufficient documentation

## 2024-07-06 LAB — COMPREHENSIVE METABOLIC PANEL WITH GFR
ALT: 14 U/L (ref 0–44)
AST: 21 U/L (ref 15–41)
Albumin: 4.1 g/dL (ref 3.5–5.0)
Alkaline Phosphatase: 83 U/L (ref 38–126)
Anion gap: 10 (ref 5–15)
BUN: 12 mg/dL (ref 6–20)
CO2: 27 mmol/L (ref 22–32)
Calcium: 9.5 mg/dL (ref 8.9–10.3)
Chloride: 102 mmol/L (ref 98–111)
Creatinine, Ser: 0.73 mg/dL (ref 0.44–1.00)
GFR, Estimated: >60 mL/min (ref >60)
Glucose, Bld: 94 mg/dL (ref 70–99)
Potassium: 3.7 mmol/L (ref 3.5–5.1)
Sodium: 140 mmol/L (ref 135–145)
Total Bilirubin: 0.4 mg/dL (ref 0.0–1.2)
Total Protein: 6.8 g/dL (ref 6.5–8.1)

## 2024-07-06 LAB — URINALYSIS, ROUTINE W REFLEX MICROSCOPIC
Bilirubin Urine: NEGATIVE
Glucose, UA: NEGATIVE mg/dL
Hgb urine dipstick: NEGATIVE
Ketones, ur: NEGATIVE mg/dL
Leukocytes,Ua: NEGATIVE
Nitrite: NEGATIVE
Protein, ur: NEGATIVE mg/dL
Specific Gravity, Urine: <1.005 — ABNORMAL LOW (ref 1.005–1.030)
pH: 6.5 (ref 5.0–8.0)

## 2024-07-06 LAB — CBC
HCT: 41.2 % (ref 36.0–46.0)
Hemoglobin: 13.9 g/dL (ref 12.0–15.0)
MCH: 29.9 pg (ref 26.0–34.0)
MCHC: 33.7 g/dL (ref 30.0–36.0)
MCV: 88.6 fL (ref 80.0–100.0)
Platelets: 327 K/uL (ref 150–400)
RBC: 4.65 MIL/uL (ref 3.87–5.11)
RDW: 12.7 % (ref 11.5–15.5)
WBC: 8.6 K/uL (ref 4.0–10.5)
nRBC: 0 % (ref 0.0–0.2)

## 2024-07-06 LAB — LIPASE, BLOOD: Lipase: 32 U/L (ref 11–51)

## 2024-07-06 MED ORDER — IOHEXOL 300 MG/ML  SOLN
100.0000 mL | Freq: Once | INTRAMUSCULAR | Status: AC | PRN
  Administered 2024-07-06: 100 mL via INTRAVENOUS

## 2024-07-06 MED ORDER — ONDANSETRON HCL 4 MG/2ML IJ SOLN
4.0000 mg | Freq: Once | INTRAMUSCULAR | Status: AC
  Administered 2024-07-06: 4 mg via INTRAVENOUS
  Filled 2024-07-06: qty 2

## 2024-07-06 MED ORDER — HYDROMORPHONE HCL 1 MG/ML IJ SOLN
1.0000 mg | Freq: Once | INTRAMUSCULAR | Status: AC
  Administered 2024-07-06: 1 mg via INTRAVENOUS
  Filled 2024-07-06: qty 1

## 2024-07-06 NOTE — ED Notes (Signed)
 Pt given discharge instructions. Opportunities given for questions. IV removed. Pt stable at time of discharge.

## 2024-07-06 NOTE — ED Provider Notes (Addendum)
 [place] EMERGENCY DEPARTMENT AT [facility] Provider Note   CSN: [id number] Arrival date & time: 07/06/24  1459     Patient presents with: Constipation   Sheryl [name] is a 53 y.o. female.   Patient concerned about recurrent perirectal abscess had it surgically drained in April.  Feels somewhat the same.  Patient having a little bit difficulty with stooling.  No nausea no vomiting.  No fevers.  No drainage from the perianal area.  Patient does have some chronic GI problems.  Past medical history significant for chronic migraines asthma.  Patient's had a total hysterectomy in 2018 left heart cath in 2019.       Prior to Admission medications   Medication Sig Start Date End Date Taking? Authorizing Provider  acetaminophen  (TYLENOL ) 500 MG tablet Take 500-1,000 mg by mouth every 6 (six) hours as needed (for pain/headaches.).    [provider]  albuterol (PROVENTIL) (2.5 MG/3ML) 0.083% nebulizer solution Take 2.5 mg by nebulization every 6 (six) hours as needed for shortness of breath.    [provider]  albuterol (VENTOLIN HFA) 108 (90 Base) MCG/ACT inhaler Inhale 2 puffs into the lungs every 6 (six) hours as needed for wheezing or shortness of breath. 05/21/20   [provider]  ascorbic acid (VITAMIN C) 500 MG tablet Take 500 mg by mouth daily.    [provider]  atenolol  (TENORMIN ) 25 MG tablet Take 1 tablet (25 mg total) by mouth daily. 04/03/24   Gayland Lauraine PARAS, NP  cholecalciferol (VITAMIN D3) 25 MCG (1000 UNIT) tablet Take 1,000 Units by mouth daily. 12/15/16   [provider]  cyanocobalamin (VITAMIN B12) 1000 MCG tablet Take 1,000 mcg by mouth daily.    [provider]  diphenhydrAMINE -zinc acetate (BENADRYL ) cream Apply 1 Application topically 3 (three) times daily as needed for itching.    [provider]  fexofenadine-pseudoephedrine  (ALLEGRA-D 24) 180-240 MG 24 hr tablet Take 1 tablet by mouth daily.     [provider]  fluticasone (FLONASE) 50 MCG/ACT nasal spray Place 2 sprays into both nostrils daily.    [provider]  HYDROcodone -acetaminophen  (NORCO/VICODIN) 5-325 MG tablet Take 1 tablet by mouth every 6 (six) hours as needed for moderate pain (pain score 4-6). 11/17/23   Vicci Burnard SAUNDERS, PA-C  ipratropium-albuterol (DUONEB) 0.5-2.5 (3) MG/3ML SOLN Take 3 mLs by nebulization every 6 (six) hours as needed (severe shortness of breath).    [provider]  Melatonin 10 MG TABS Take 10 mg by mouth at bedtime.    [provider]  ondansetron  (ZOFRAN ) 4 MG tablet Take 1 tablet (4 mg total) by mouth every 8 (eight) hours as needed for up to 20 doses for nausea or vomiting. 06/13/23   Andris Estefana BRAVO, PA-C  zolmitriptan  (ZOMIG ) 5 MG tablet TAKE 1 TABLET BY MOUTH AT ONSET OF MIGRAINE. MAY REPEAT IN 2 HOURS IF NEEDED. MAX 2 IN 24 HOURS 08/16/23   Gayland Lauraine PARAS, NP    Allergies: Aspirin , Salicylates, Codeine, and Fentanyl     Review of Systems  Constitutional:  Negative for chills and fever.  HENT:  Negative for ear pain and sore throat.   Eyes:  Negative for pain and visual disturbance.  Respiratory:  Negative for cough and shortness of breath.   Cardiovascular:  Negative for chest pain and palpitations.  Gastrointestinal:  Positive for abdominal pain and constipation. Negative for blood in stool and vomiting.  Genitourinary:  Negative for dysuria and hematuria.  Musculoskeletal:  Negative for arthralgias and back pain.  Skin:  Negative for color change and rash.  Neurological:  Negative for seizures and syncope.  All other systems reviewed and are negative.   Updated Vital Signs BP (!) 141/100 (BP Location: Right Arm)   Pulse 77   Temp 98.2 F (36.8 C) (Oral)   Resp 18   Ht 1.702 m (5' 7)   Wt (!) 136.5 kg   LMP 05/26/2017 (Exact Date)   SpO2 95%   BMI 47.14 kg/m   Physical Exam Vitals and nursing note reviewed.  Constitutional:       General: She is not in acute distress.    Appearance: Normal appearance. She is well-developed.  HENT:     Head: Normocephalic and atraumatic.  Eyes:     Conjunctiva/sclera: Conjunctivae normal.     Pupils: Pupils are equal, round, and reactive to light.  Cardiovascular:     Rate and Rhythm: Normal rate and regular rhythm.     Heart sounds: No murmur heard. Pulmonary:     Effort: Pulmonary effort is normal. No respiratory distress.     Breath sounds: Normal breath sounds.  Abdominal:     General: There is no distension.     Palpations: Abdomen is soft.     Tenderness: There is no abdominal tenderness. There is no guarding.  Genitourinary:    Comments: Perianal area without any induration without any erythema.  No anal fissure.  No external hemorrhoids no prolapsed internal hemorrhoids. Musculoskeletal:        General: No swelling.     Cervical back: Normal range of motion and neck supple.  Skin:    General: Skin is warm and dry.     Capillary Refill: Capillary refill takes less than 2 seconds.  Neurological:     General: No focal deficit present.     Mental Status: She is alert and oriented to person, place, and time.  Psychiatric:        Mood and Affect: Mood normal.     (all labs ordered are listed, but only abnormal results are displayed) Labs Reviewed  URINALYSIS, ROUTINE W REFLEX MICROSCOPIC - Abnormal; Notable for the following components:      Result Value   Color, Urine COLORLESS (*)    Specific Gravity, Urine <1.005 (*)    All other components within normal limits  LIPASE, BLOOD  COMPREHENSIVE METABOLIC PANEL WITH GFR  CBC    EKG: None  Radiology: No results found.   Procedures   Medications Ordered in the ED  ondansetron  (ZOFRAN ) injection 4 mg (has no administration in time range)  HYDROmorphone  (DILAUDID ) injection 1 mg (has no administration in time range)                                    Medical Decision Making Amount and/or Complexity of  Data Reviewed Labs: ordered. Radiology: ordered.  Risk Prescription drug management.   Patient is lipase 32 complete metabolic panel normal LFTs normal.  Renal function normal.  White count is normal at 8.6 hemoglobin 13.9 platelets 327.  Urinalysis negative.  And urine is dilute.  CT scan abdomen and pelvis no acute intra-abdominal or intrapelvic process no evidence of residual or recurrent perirectal abscess.  No evidence of any constipation.   Final diagnoses:  Perianal pain    ED Discharge Orders     None  Jilliann Subramanian, MD 07/06/24 2034    Geraldene Hamilton, MD 07/06/24 [id number]    Geraldene Hamilton, MD 07/06/24 [phone]

## 2024-07-06 NOTE — ED Triage Notes (Signed)
 Pt POV reporting constipation, took six doses of miralax  today with loose stool, also reporting rectal pain. Hx of rectal abscess, afebrile.

## 2024-07-06 NOTE — Discharge Instructions (Addendum)
 Labs and CT scan without any acute findings.  Which is reassuring.  No evidence of any recurrent perianal or perirectal abscess.  No evidence of any significant constipation.  Follow-up with your doctors.

## 2024-08-22 ENCOUNTER — Telehealth: Payer: 59 | Admitting: Neurology

## 2024-08-22 DIAGNOSIS — G43709 Chronic migraine without aura, not intractable, without status migrainosus: Secondary | ICD-10-CM | POA: Diagnosis not present

## 2024-08-22 MED ORDER — ZOLMITRIPTAN 5 MG PO TABS: ORAL_TABLET | ORAL | 11 refills | Status: AC

## 2024-08-22 NOTE — Progress Notes (Signed)
 Virtual Visit via Video Note  I connected with Sheryl [name] on 08/22/2024 at  3:00 PM EST by a video enabled telemedicine application and verified that I am speaking with the correct person using two identifiers.  Location: Patient: at her home Provider: in the office    I discussed the limitations of evaluation and management by telemedicine and the availability of in person appointments. The patient expressed understanding and agreed to proceed.  PATIENT: Sheryl [name] DOB: [birth year]  REASON FOR VISIT: follow up for migraines  HISTORY FROM: patient Primary Neurologist: Dr. Onita   ASSESSMENT AND PLAN 53 y.o. year old female  1.  Chronic migraine headache -Migraines remain under very good control without atenolol   -Will remain off atenolol  for migraine preventative -Continue Zomig  as needed for acute headache, may combine with Zofran , Aleve for acute migraine  -Follow-up in 1 year for virtual visit  Meds ordered this encounter  Medications   zolmitriptan  (ZOMIG ) 5 MG tablet    Sig: TAKE 1 TABLET BY MOUTH AT ONSET OF MIGRAINE. MAY REPEAT IN 2 HOURS IF NEEDED. MAX 2 IN 24 HOURS    Dispense:  12 tablet    Refill:  11   HISTORY Sheryl [name] is a 53 years old female, seen in refer by  her primary care doctor Sheryl [name], for evaluation of frequent migraine headache initial evaluation was June 15 2017.   I have reviewed the record, she has a long history of migraine headaches since teenager, gradually getting worse, began to see Dr. Lindy in 2008, at that time she reported almost daily headaches,   She was treated with preventive medication atenolol  50 mg daily, which is helpful, previously also tried amitriptyline, nortriptyline, verapamil , which all helped some, but gradually loses benefit,   She was able to identify triggers such as cheese, onion, stress, caffeine, chocolate, heat,   Her headache overall has much improved, she is now taking Relpax  as needed, that is  the only triptan she has tried, at its best, it would take away her headache within one hour, but sometimes her headache would last for half days, occasionally she received Dilaudid , Phenergan  and Demerol  as abortive treatment   Her typical migraine are right lateralized severe pounding headache with associated light noise sensitivity, nauseous, vomiting, sometimes involving left side,   She was admitted to the hospital in September 2011 for 1 prolonged episode of headache with associated right side weakness and blurry vision, she had extensive evaluations, MRI of the brain showed no acute abnormality. Echocardiogram showed ejection fraction 65%, transcranial Doppler study was normal,   UPDATE 2017/10/29:YY Her father passed away on 07-08-17, she complains of increased headaches, 8-10 migraines each month,Maxalt  does help her migraine, she felt wiped out afterwards     UPDATE Oct 31 2018: She is doing very well with atenolol  25 mg every day as headache prevention, on he has 1-2 headaches each month, responding well to Relpax ,   Update November 12, 2019 SS: Via virtual visit, reports headaches had been doing really well, for the last 30 days, has had 6 migraines.  Usually, she only has 2 a month.  Attributed to increased stress, her son is having a baby unexpectedly.  Currently, Zomig  works well for her, but she only gets 3 tablets a month per insurance.  She remains on atenolol .   Update February 14, 2020 SS: After last visit, atenolol  was increased to 50 mg for migraine prevention due to  worsening headaches associated with increased stress.  She only took the higher dose for 1 month, went back down to 25 mg daily.  She is doing well.  On average 1-2 headaches a month. She has Zomig , Zanaflex , Zofran  for acute headache-these work well for her. She only gets 3 Zomig  tablets a month.  She is now exercising, trying to stay out of the heat which can be a trigger for migraines.  Presents today for evaluation  unaccompanied. She is expecting her first grandchild.  Update February 19, 2021 SS: On average 1-2 migraines a month, April had none. This month has had 2, heat related. Insurance won't pay for Relpax  this works best. For rescue now: Zomig , Zofran , tizanidine . Has slight headache today, came home from beach after 1 week. Didn't feel higher dose of atenolol  was helpful.   Update August 27, 2021 SS: Lately only 2 migraines days a month, this is good for her. 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 At 1 time 7-10 migraines a month. Remains on atenolol  25 mg daily for migraine prevention. Taking Zomig  usually works great, recently had to take it twice due to lack of sleep. Insurance won't cover Relpax . Keep Zofran , tizanidine  if needed for bad migraine. A lot less stress right now with family.   Update August 11, 2022 SS: Doing well, got a promotion, having a new grandbaby. Migraines on average 1-2 a month. Remains on atenolol  25 mg daily, tried to stop but had more headaches. Zomig  works well, works usually within 30 minutes, can be 2 hours. Will take Zofran  for nausea if needed. Sometimes neck tension can trigger migraine, uses tizanidine , but finds that Flexeril  works better. Needs knee replacement but needs to lose weight first   Update August 17, 2023 SS: Via VV, having about 1-3 migraines a month, increased a little, due to recent illness, had breast reduction in October, on pain medication caused rebound headache. Remains on atenolol . Uses Zomig , add with zofran , hasn't had any flexeril , doesn't think she needs. Has been sore healing from breast reduction.   Update 08/22/24 SS: via VV, she has the flu. Migraines doing well. Has gone 6 months without atenolol ! Only 1 migraine this month! Uses Zomig  PRN with good benefit. Has been through a lot of stress and is glad her migraines have done so well!!  REVIEW OF SYSTEMS: Out of a complete 14 system review of symptoms, the patient complains  only of the following symptoms, and all other reviewed systems are negative.  See HPI  ALLERGIES: Allergies  Allergen Reactions   Aspirin  Anaphylaxis and Hives   Salicylates Anaphylaxis and Hives    Unknown reaction Sulfur meds   Codeine Other (See Comments)    Migraines, anxiety    Fentanyl  Nausea And Vomiting    Migraine    HOME MEDICATIONS: Outpatient Medications Prior to Visit  Medication Sig Dispense Refill   acetaminophen  (TYLENOL ) 500 MG tablet Take 500-1,000 mg by mouth every 6 (six) hours as needed (for pain/headaches.).     albuterol (PROVENTIL) (2.5 MG/3ML) 0.083% nebulizer solution Take 2.5 mg by nebulization every 6 (six) hours as needed for shortness of breath.     albuterol (VENTOLIN HFA) 108 (90 Base) MCG/ACT inhaler Inhale 2 puffs into the lungs every 6 (six) hours as needed for wheezing or shortness of breath.     ascorbic acid (VITAMIN C) 500 MG tablet Take 500 mg by mouth daily.     cholecalciferol (VITAMIN D3) 25 MCG (1000  UNIT) tablet Take 1,000 Units by mouth daily.     cyanocobalamin (VITAMIN B12) 1000 MCG tablet Take 1,000 mcg by mouth daily.     diphenhydrAMINE -zinc acetate (BENADRYL ) cream Apply 1 Application topically 3 (three) times daily as needed for itching.     fexofenadine-pseudoephedrine  (ALLEGRA-D 24) 180-240 MG 24 hr tablet Take 1 tablet by mouth daily.     fluticasone (FLONASE) 50 MCG/ACT nasal spray Place 2 sprays into both nostrils daily.     HYDROcodone -acetaminophen  (NORCO/VICODIN) 5-325 MG tablet Take 1 tablet by mouth every 6 (six) hours as needed for moderate pain (pain score 4-6). 15 tablet 0   ipratropium-albuterol (DUONEB) 0.5-2.5 (3) MG/3ML SOLN Take 3 mLs by nebulization every 6 (six) hours as needed (severe shortness of breath).     Melatonin 10 MG TABS Take 10 mg by mouth at bedtime.     ondansetron  (ZOFRAN ) 4 MG tablet Take 1 tablet (4 mg total) by mouth every 8 (eight) hours as needed for up to 20 doses for nausea or vomiting. 20  tablet 0   atenolol  (TENORMIN ) 25 MG tablet Take 1 tablet (25 mg total) by mouth daily. 90 tablet 0   zolmitriptan  (ZOMIG ) 5 MG tablet TAKE 1 TABLET BY MOUTH AT ONSET OF MIGRAINE. MAY REPEAT IN 2 HOURS IF NEEDED. MAX 2 IN 24 HOURS 12 tablet 11   No facility-administered medications prior to visit.    PAST MEDICAL HISTORY: Past Medical History:  Diagnosis Date   Allergic rhinitis    Anemia    Arthritis    bilateral knees   Asthma    Atypical chest pain 01/07/2012   Ct chest 10/2011:  No acute process, no PE to midsized pulmonary vessels.     Chronic migraine    Migraines    Miscarriage 08/1996, 12/1996   x2   Normal labor 03/19/2014   Pneumonia    PONV (postoperative nausea and vomiting)    Postoperative state 03/20/2014   SVD (spontaneous vaginal delivery)    x 2   Vitamin D deficiency     PAST SURGICAL HISTORY: Past Surgical History:  Procedure Laterality Date   ABDOMINAL HYSTERECTOMY     BREAST REDUCTION SURGERY Bilateral 06/22/2023   Procedure: BILATERAL BREAST REDUCTION WITH LIPOSUCTION;  Surgeon: Lowery Estefana RAMAN, DO;  Location: [facility] OR;  Service: Plastics;  Laterality: Bilateral;   BREAST SURGERY     cyst removed from left breast; benign   CESAREAN SECTION N/A 03/20/2014   Procedure: CESAREAN SECTION;  Surgeon: Rosaline DELENA Luna, MD;  Location: [facility];  Service: Obstetrics;  Laterality: N/A;   cyst removed from L breast  10/2006   benign   CYSTOSCOPY N/A 06/01/2017   Procedure: CYSTOSCOPY;  Surgeon: Luna Rosaline, MD;  Location: [facility];  Service: Gynecology;  Laterality: N/A;   DILATION AND CURETTAGE OF UTERUS  08/1996, 12/1996   x 2 MAB   DILATION AND CURETTAGE OF UTERUS     INCISION AND DRAINAGE PERIRECTAL ABSCESS N/A 11/16/2023   Procedure: INCISION AND DRAINAGE, ABSCESS, PERIRECTAL;  Surgeon: Rubin Calamity, MD;  Location: [facility];  Service: General;  Laterality: N/A;  lithotomy   KNEE ARTHROSCOPY Right 01/2016   KNEE ARTHROSCOPY Right 08/2016   LEFT  HEART CATH AND CORONARY ANGIOGRAPHY N/A 04/14/2018   Procedure: LEFT HEART CATH AND CORONARY ANGIOGRAPHY;  Surgeon: Mady Bruckner, MD;  Location: [facility];  Service: Cardiovascular;  Laterality: N/A;   live births  10/07/97, 02/08/2000   x2   ROBOTIC ASSISTED  TOTAL HYSTERECTOMY WITH SALPINGECTOMY Bilateral 06/01/2017   Procedure: ROBOTIC ASSISTED TOTAL HYSTERECTOMY WITH SALPINGECTOMY WITH PARTIAL REMOVAL OF HERNIA SAC;  Surgeon: Sarrah Browning, MD;  Location: [facility];  Service: Gynecology;  Laterality: Bilateral;   TUBAL LIGATION Bilateral 03/20/2014   Procedure: BILATERAL TUBAL LIGATION;  Surgeon: Browning DELENA Sarrah, MD;  Location: [facility];  Service: Obstetrics;  Laterality: Bilateral;   UMBILICAL HERNIA REPAIR     UNILATERAL SALPINGECTOMY Right 03/20/2014   Procedure: UNILATERAL SALPINGECTOMY;  Surgeon: Browning DELENA Sarrah, MD;  Location: [facility];  Service: Obstetrics;  Laterality: Right;    FAMILY HISTORY: Family History  Problem Relation Age of Onset   Heart attack Father    Hypertension Father    Hyperlipidemia Father    Asthma Son    Heart disease Maternal Grandfather    Cancer Paternal Grandmother        unsure what kind    SOCIAL HISTORY: Social History   Socioeconomic History   Marital status: Married    Spouse name: Not on file   Number of children: 3   Years of education: 2.5 yrs college   Highest education level: Not on file  Occupational History   Occupation: Production Designer, Theatre/television/film. Rep    Employer: [employer]  Tobacco Use   Smoking status: Never   Smokeless tobacco: Never  Vaping Use   Vaping status: Never Used  Substance and Sexual Activity   Alcohol use: No   Drug use: No   Sexual activity: Yes    Birth control/protection: Surgical  Other Topics Concern   Not on file  Social History Narrative   Lives at home with husband and three children.   Left-handed.   No caffeine use.       Social Drivers of Health   Tobacco Use:  Low Risk (07/06/2024)   Patient History    Smoking Tobacco Use: Never    Smokeless Tobacco Use: Never    Passive Exposure: Not on file  Financial Resource Strain: Not on file  Food Insecurity: No Food Insecurity (11/17/2023)   Hunger Vital Sign    Worried About Running Out of Food in the Last Year: Never true    Ran Out of Food in the Last Year: Never true  Transportation Needs: No Transportation Needs (11/17/2023)   PRAPARE - Administrator, Civil Service (Medical): No    Lack of Transportation (Non-Medical): No  Physical Activity: Not on file  Stress: Not on file  Social Connections: Unknown (01/18/2022)   [facility]   Social Network    Social Network: Not on file  Intimate Partner Violence: Not At Risk (11/17/2023)   Humiliation, Afraid, Rape, and Kick questionnaire    Fear of Current or Ex-Partner: No    Emotionally Abused: No    Physically Abused: No    Sexually Abused: No  Depression (PHQ2-9): Not on file  Alcohol Screen: Not on file  Housing: Unknown (11/25/2023)   Received from [facility]   Epic    Unable to Pay for Housing in the Last Year: Not on file    Number of Times Moved in the Last Year: Not on file    At any time in the past 12 months, were you homeless or living in a shelter (including now)?: No  Utilities: Not At Risk (11/17/2023)   [facility] Utilities    Threatened with loss of utilities: No  Health Literacy: Not on file   PHYSICAL EXAM  Virtual  visit  DIAGNOSTIC DATA (LABS, IMAGING, TESTING) - I reviewed patient records, labs, notes, testing and imaging myself where available.  Lab Results  Component Value Date   WBC 8.6 07/06/2024   HGB 13.9 07/06/2024   HCT 41.2 07/06/2024   MCV 88.6 07/06/2024   PLT 327 07/06/2024      Component Value Date/Time   NA 140 07/06/2024 1517   NA 138 04/26/2018 1204   K 3.7 07/06/2024 1517   CL 102 07/06/2024 1517   CO2 27 07/06/2024 1517   GLUCOSE 94 07/06/2024 1517    BUN 12 07/06/2024 1517   BUN 10 04/26/2018 1204   CREATININE 0.73 07/06/2024 1517   CALCIUM 9.5 07/06/2024 1517   PROT 6.8 07/06/2024 1517   ALBUMIN 4.1 07/06/2024 1517   AST 21 07/06/2024 1517   ALT 14 07/06/2024 1517   ALKPHOS 83 07/06/2024 1517   BILITOT 0.4 07/06/2024 1517   GFRNONAA >60 07/06/2024 1517   GFRAA 116 04/26/2018 1204   Lab Results  Component Value Date   CHOL 168 04/26/2018   HDL 40 04/26/2018   LDLCALC 87 04/26/2018   TRIG 205 (H) 04/26/2018   CHOLHDL 4.2 04/26/2018   Lab Results  Component Value Date   HGBA1C  06/03/2010    5.5 (NOTE)                                                                       According to the ADA Clinical Practice Recommendations for 2011, when HbA1c is used as a screening test:   >=6.5%   Diagnostic of Diabetes Mellitus           (if abnormal result  is confirmed)  5.7-6.4%   Increased risk of developing Diabetes Mellitus  References:Diagnosis and Classification of Diabetes Mellitus,Diabetes Care,2011,34(Suppl 1):S62-S69 and Standards of Medical Care in         Diabetes - 2011,Diabetes Care,2011,34  (Suppl 1):S11-S61.   No results found for: VITAMINB12 No results found for: TSH  Lauraine Born, AGNP-C, DNP 08/22/2024, 3:01 PM [facility] [address] [place], [place] [address] [phone]

## 2024-08-22 NOTE — Patient Instructions (Signed)
 Great to see you today! Continue Zomig  as needed Call for increase in headache Follow-up 1 year.  Thanks!!

## 2024-09-20 ENCOUNTER — Emergency Department (HOSPITAL_BASED_OUTPATIENT_CLINIC_OR_DEPARTMENT_OTHER)
Admission: EM | Admit: 2024-09-20 | Discharge: 2024-09-20 | Disposition: A | Discharge status: Home / Self Care | Attending: Emergency Medicine | Admitting: Emergency Medicine

## 2024-09-20 ENCOUNTER — Emergency Department (HOSPITAL_BASED_OUTPATIENT_CLINIC_OR_DEPARTMENT_OTHER): Admitting: Radiology

## 2024-09-20 ENCOUNTER — Other Ambulatory Visit: Payer: Self-pay

## 2024-09-20 ENCOUNTER — Encounter (HOSPITAL_BASED_OUTPATIENT_CLINIC_OR_DEPARTMENT_OTHER): Payer: Self-pay

## 2024-09-20 DIAGNOSIS — R059 Cough, unspecified: Secondary | ICD-10-CM | POA: Diagnosis present

## 2024-09-20 DIAGNOSIS — Z7951 Long term (current) use of inhaled steroids: Secondary | ICD-10-CM | POA: Insufficient documentation

## 2024-09-20 DIAGNOSIS — J45909 Unspecified asthma, uncomplicated: Secondary | ICD-10-CM | POA: Diagnosis not present

## 2024-09-20 DIAGNOSIS — J40 Bronchitis, not specified as acute or chronic: Secondary | ICD-10-CM | POA: Insufficient documentation

## 2024-09-20 DIAGNOSIS — R0602 Shortness of breath: Secondary | ICD-10-CM | POA: Insufficient documentation

## 2024-09-20 DIAGNOSIS — D72829 Elevated white blood cell count, unspecified: Secondary | ICD-10-CM | POA: Diagnosis not present

## 2024-09-20 LAB — CBC WITH DIFFERENTIAL/PLATELET
Abs Immature Granulocytes: 0.05 K/uL (ref 0.00–0.07)
Basophils Absolute: 0 K/uL (ref 0.0–0.1)
Basophils Relative: 0 %
Eosinophils Absolute: 0.3 K/uL (ref 0.0–0.5)
Eosinophils Relative: 2 %
HCT: 43.4 % (ref 36.0–46.0)
Hemoglobin: 14.7 g/dL (ref 12.0–15.0)
Immature Granulocytes: 0 %
Lymphocytes Relative: 16 %
Lymphs Abs: 2.3 K/uL (ref 0.7–4.0)
MCH: 29.8 pg (ref 26.0–34.0)
MCHC: 33.9 g/dL (ref 30.0–36.0)
MCV: 87.9 fL (ref 80.0–100.0)
Monocytes Absolute: 1 K/uL (ref 0.1–1.0)
Monocytes Relative: 8 %
Neutro Abs: 10.2 K/uL — ABNORMAL HIGH (ref 1.7–7.7)
Neutrophils Relative %: 74 %
Platelets: 355 K/uL (ref 150–400)
RBC: 4.94 MIL/uL (ref 3.87–5.11)
RDW: 12.8 % (ref 11.5–15.5)
WBC: 13.9 K/uL — ABNORMAL HIGH (ref 4.0–10.5)
nRBC: 0 % (ref 0.0–0.2)

## 2024-09-20 LAB — COMPREHENSIVE METABOLIC PANEL WITH GFR
ALT: 33 U/L (ref 0–44)
AST: 22 U/L (ref 15–41)
Albumin: 3.9 g/dL (ref 3.5–5.0)
Alkaline Phosphatase: 72 U/L (ref 38–126)
Anion gap: 10 (ref 5–15)
BUN: 13 mg/dL (ref 6–20)
CO2: 29 mmol/L (ref 22–32)
Calcium: 9.9 mg/dL (ref 8.9–10.3)
Chloride: 100 mmol/L (ref 98–111)
Creatinine, Ser: 0.76 mg/dL (ref 0.44–1.00)
GFR, Estimated: >60 mL/min
Glucose, Bld: 107 mg/dL — ABNORMAL HIGH (ref 70–99)
Potassium: 3.8 mmol/L (ref 3.5–5.1)
Sodium: 139 mmol/L (ref 135–145)
Total Bilirubin: 0.4 mg/dL (ref 0.0–1.2)
Total Protein: 6.6 g/dL (ref 6.5–8.1)

## 2024-09-20 LAB — RESP PANEL BY RT-PCR (RSV, FLU A&B, COVID)  RVPGX2
Influenza A by PCR: NEGATIVE
Influenza B by PCR: NEGATIVE
Resp Syncytial Virus by PCR: NEGATIVE
SARS Coronavirus 2 by RT PCR: NEGATIVE

## 2024-09-20 LAB — TROPONIN T, HIGH SENSITIVITY: Troponin T High Sensitivity: <15 ng/L (ref 0–19)

## 2024-09-20 LAB — D-DIMER, QUANTITATIVE: D-Dimer, Quant: 0.39 ug{FEU}/mL (ref 0.00–0.50)

## 2024-09-20 LAB — PRO BRAIN NATRIURETIC PEPTIDE: Pro Brain Natriuretic Peptide: <50 pg/mL

## 2024-09-20 MED ORDER — METHYLPREDNISOLONE SODIUM SUCC 125 MG IJ SOLR
125.0000 mg | Freq: Once | INTRAMUSCULAR | Status: AC
  Administered 2024-09-20: 125 mg via INTRAVENOUS
  Filled 2024-09-20: qty 2

## 2024-09-20 MED ORDER — LACTATED RINGERS IV BOLUS
1000.0000 mL | Freq: Once | INTRAVENOUS | Status: AC
  Administered 2024-09-20: 1000 mL via INTRAVENOUS

## 2024-09-20 MED ORDER — IPRATROPIUM-ALBUTEROL 0.5-2.5 (3) MG/3ML IN SOLN
3.0000 mL | Freq: Once | RESPIRATORY_TRACT | Status: AC
  Administered 2024-09-20: 3 mL via RESPIRATORY_TRACT
  Filled 2024-09-20: qty 3

## 2024-09-20 NOTE — Discharge Instructions (Addendum)
 It appears that your symptoms are due to bronchitis which is irritation of the lungs usually due to a virus. Your cough should slowly start to improve over the next 1-2 weeks  We do not see any signs of pneumonia on your chest x-ray.  Your COVID, flu, and RSV testing is negative.  Please continue taking your Symbicort as prescribed at home.  You may use your nebulized treatments as needed for episodes of increased wheezing or shortness of breath.  Your blood counts and electrolytes are reassuring.  Normal kidney and liver testing.  You had normal heart testing, no signs of a heart attack.  No signs of a blood clot.  No signs of heart failure.  Given the ongoing nature of your cough, I have sent a pulmonology referral for you.  Please be on the look out for call to schedule a follow-up appointment with pulmonology.  Please return to the ER for any severe shortness of breath, chest pain, unexplained fevers, any other new or concerning symptoms

## 2024-09-20 NOTE — ED Triage Notes (Signed)
 Pt arrives with c/o SOB and cough that started about 4-5 weeks ago. Pt has been treated outpt by PCP with PO antibiotics and nebs. Pt reports back pain.

## 2024-09-20 NOTE — ED Provider Notes (Signed)
 " [facility] AT [facility] Provider Note   CSN: [id number] Arrival date & time: 09/20/24  1610     Patient presents with: Cough and Shortness of Breath   Sheryl [name] is a 54 y.o. female with history of asthma, obesity, presents with concern for an ongoing productive cough for the past month and a half.  Reports she was around her grandkids at the beginning of December who tested positive for RSV, and since then, she has had an ongoing cough.  Reports that she is coughing up clear to white appearing sputum.  She reports she has been on azithromycin , ivermectin, prednisone , and Symbicort prescribed by her PCP without improvement in symptoms.  She reports some intermittent shortness of breath when she coughs.  Denies any chest pain.  Denies any fever or chills at home.  Denies any lower extremity pain or swelling.  No recent hospitalizations or surgeries, no recent long plane or car rides, no history of blood clot.  She is not on any estrogen containing medications.    Cough Associated symptoms: shortness of breath   Shortness of Breath Associated symptoms: cough        Prior to Admission medications  Medication Sig Start Date End Date Taking? Authorizing Provider  acetaminophen  (TYLENOL ) 500 MG tablet Take 500-1,000 mg by mouth every 6 (six) hours as needed (for pain/headaches.).    [provider]  albuterol  (PROVENTIL ) (2.5 MG/3ML) 0.083% nebulizer solution Take 2.5 mg by nebulization every 6 (six) hours as needed for shortness of breath.    [provider]  albuterol  (VENTOLIN  HFA) 108 (90 Base) MCG/ACT inhaler Inhale 2 puffs into the lungs every 6 (six) hours as needed for wheezing or shortness of breath. 05/21/20   [provider]  ascorbic acid (VITAMIN C) 500 MG tablet Take 500 mg by mouth daily.    [provider]  cholecalciferol (VITAMIN D3) 25 MCG (1000 UNIT) tablet Take 1,000 Units by mouth daily. 12/15/16    [provider]  cyanocobalamin (VITAMIN B12) 1000 MCG tablet Take 1,000 mcg by mouth daily.    [provider]  diphenhydrAMINE -zinc acetate (BENADRYL ) cream Apply 1 Application topically 3 (three) times daily as needed for itching.    [provider]  fexofenadine-pseudoephedrine  (ALLEGRA-D 24) 180-240 MG 24 hr tablet Take 1 tablet by mouth daily.    [provider]  fluticasone (FLONASE) 50 MCG/ACT nasal spray Place 2 sprays into both nostrils daily.    [provider]  HYDROcodone -acetaminophen  (NORCO/VICODIN) 5-325 MG tablet Take 1 tablet by mouth every 6 (six) hours as needed for moderate pain (pain score 4-6). 11/17/23   Johnson, Kelly R, PA-C  ipratropium-albuterol  (DUONEB) 0.5-2.5 (3) MG/3ML SOLN Take 3 mLs by nebulization every 6 (six) hours as needed (severe shortness of breath).    [provider]  Melatonin 10 MG TABS Take 10 mg by mouth at bedtime.    [provider]  ondansetron  (ZOFRAN ) 4 MG tablet Take 1 tablet (4 mg total) by mouth every 8 (eight) hours as needed for up to 20 doses for nausea or vomiting. 06/13/23   Andris Estefana BRAVO, PA-C  zolmitriptan  (ZOMIG ) 5 MG tablet TAKE 1 TABLET BY MOUTH AT ONSET OF MIGRAINE. MAY REPEAT IN 2 HOURS IF NEEDED. MAX 2 IN 24 HOURS 08/22/24   Gayland Lauraine PARAS, NP    Allergies: Aspirin , Salicylates, Codeine, and Fentanyl     Review of Systems  Respiratory:  Positive for cough and shortness of  breath.     Updated Vital Signs BP (!) 138/94   Pulse 96   Temp 98.7 F (37.1 C) (Oral)   Resp (!) 21   Wt 135.2 kg   LMP 05/08/2017   SpO2 100%   BMI 46.67 kg/m   Physical Exam Vitals and nursing note reviewed.  Constitutional:      General: She is not in acute distress.    Appearance: She is well-developed.  HENT:     Head: Normocephalic and atraumatic.  Eyes:     Conjunctiva/sclera: Conjunctivae normal.  Cardiovascular:     Rate and Rhythm: Normal rate and regular rhythm.      Heart sounds: No murmur heard. Pulmonary:     Effort: Pulmonary effort is normal. No respiratory distress.     Breath sounds: Normal breath sounds.     Comments: Mild expiratory wheezing bilaterally.  Able to talk in full sentences on room air without difficulty Abdominal:     Palpations: Abdomen is soft.     Tenderness: There is no abdominal tenderness.  Musculoskeletal:        General: No swelling.     Cervical back: Neck supple.     Right lower leg: No edema.     Left lower leg: No edema.     Comments: No calf tenderness to palpation bilaterally  Skin:    General: Skin is warm and dry.     Capillary Refill: Capillary refill takes less than 2 seconds.  Neurological:     Mental Status: She is alert.  Psychiatric:        Mood and Affect: Mood normal.     (all labs ordered are listed, but only abnormal results are displayed) Labs Reviewed  CBC WITH DIFFERENTIAL/PLATELET - Abnormal; Notable for the following components:      Result Value   WBC 13.9 (*)    Neutro Abs 10.2 (*)    All other components within normal limits  COMPREHENSIVE METABOLIC PANEL WITH GFR - Abnormal; Notable for the following components:   Glucose, Bld 107 (*)    All other components within normal limits  RESP PANEL BY RT-PCR (RSV, FLU A&B, COVID)  RVPGX2  PRO BRAIN NATRIURETIC PEPTIDE  D-DIMER, QUANTITATIVE  TROPONIN T, HIGH SENSITIVITY    EKG: EKG Interpretation Date/Time:  Thursday September 20 2024 16:36:36 EST Ventricular Rate:  101 PR Interval:  127 QRS Duration:  88 QT Interval:  352 QTC Calculation: 457 R Axis:   88  Text Interpretation: Sinus tachycardia Confirmed by Ruthe Cornet [phone]) on 09/20/2024 4:39:00 PM  Radiology: [facility] Chest 2 View Result Date: 09/20/2024 CLINICAL DATA:  Cough. EXAM: CHEST - 2 VIEW COMPARISON:  Chest radiograph dated 01/07/2023. FINDINGS: The heart size and mediastinal contours are within normal limits. Both lungs are clear. The visualized skeletal structures  are unremarkable. IMPRESSION: No active cardiopulmonary disease. Electronically Signed   By: Vanetta Chou M.D.   On: 09/20/2024 17:00     Procedures   Medications Ordered in the ED  lactated ringers  bolus 1,000 mL (1,000 mLs Intravenous New Bag/Given 09/20/24 1724)  ipratropium-albuterol  (DUONEB) 0.5-2.5 (3) MG/3ML nebulizer solution 3 mL (3 mLs Nebulization Given 09/20/24 1702)  ipratropium-albuterol  (DUONEB) 0.5-2.5 (3) MG/3ML nebulizer solution 3 mL (3 mLs Nebulization Given 09/20/24 1702)  methylPREDNISolone  sodium succinate (SOLU-MEDROL ) 125 mg/2 mL injection 125 mg (125 mg Intravenous Given 09/20/24 1719)  Medical Decision Making Amount and/or Complexity of Data Reviewed Labs: ordered. Radiology: ordered.  Risk Prescription drug management.     Differential diagnosis includes but is not limited to COVID, flu, RSV, viral URI, pneumonia, bronchitis, pleural effusion, CHF, DVT, PE   ED Course:  Upon initial evaluation, patient is well-appearing, no acute distress.  Expiratory wheezing bilaterally on initial evaluation consistent with patient's known asthma.  Able to talk in full sentences on room air without difficulty.  Mildly tachycardic to 111 upon arrival.  She was initially started on DuoNeb upon arrival for her wheezing.  Labs Ordered: I Ordered, and personally interpreted labs.  The pertinent results include:   CBC with leukocytosis of 13.9, no other abnormalities CMP within normal limits D-dimer within normal limits Troponin within normal limits proBNP within normal limits COVID, flu, RSV negative  Imaging Studies ordered: I ordered imaging studies including chest x-ray I independently visualized the imaging with scope of interpretation limited to determining acute life threatening conditions related to emergency care. Imaging showed no acute abnormality I agree with the radiologist interpretation   Cardiac Monitoring: /  EKG: The patient was maintained on a cardiac monitor.  I personally viewed and interpreted the cardiac monitored which showed an underlying rhythm of: Sinus tachycardia improved to normal sinus rhythm   Medications Given: DuoNeb x 2 Solu-Medrol  LR bolus  Upon re-evaluation, patient's tachycardia has improved with LR bolus, Solu-Medrol , and DuoNeb given.  She reports that she feels her lungs are more open.  Still remains on 100% on room air.  Decreased wheezing on reauscultation.  No concern for asthma exacerbation at this time. She has been on a full course of prednisone  prescribed by her PCP already, do not feel she would benefit from further steroids at this time. Patient's workup is reassuring.  Chest x-ray without evidence of pneumonia, no fevers, no focal adventitious lung sounds, doubt pneumonia.  She has already completed a course of azithromycin , do not feel we need to extend or add on any antibiotics at this point.  She does have a leukocytosis of 13.9, but this seems secondary to her recent course of prednisone .  She does not have any fever or persistent tachycardia to suggest systemic infection or sepsis. COVID, flu, RSV testing negative proBNP within normal limits, no signs of pulmonary edema, doubt CHF.  D-dimer within normal limits, low risk for PE, doubt PE at this time.  Troponin within normal limits, patient without any chest pain, shortness of breath.  Patient's symptoms seem most consistent with bronchitis at this time.  She already has completed course of azithromycin  and prednisone .  She already has Tessalon  Perles and cough syrup she is using at home.  She already has Symbicort at home.  Given ongoing nature, will refer to pulmonology for further management.  Patient stable and appropriate for discharge home at this time  Impression: Bronchitis  Disposition:  Discharged home with instructions to use over-the-counter medications as needed for symptom control.  Follow-up  with pulmonology, referral sent. Return precautions given and patient verbalized understanding.    This chart was dictated using voice recognition software, Dragon. Despite the best efforts of this provider to proofread and correct errors, errors may still occur which can change documentation meaning.       Final diagnoses:  Bronchitis    ED Discharge Orders          Ordered    Ambulatory referral to Pulmonology       Comments: Chronic cough/asthma  09/20/24 1834               Veta Palma, PA-C 09/20/24 1845    Ruthe Cornet, DO 09/20/24 1901  "

## 2025-08-28 ENCOUNTER — Telehealth: Admitting: Neurology
